# Patient Record
Sex: Female | Born: 1940 | Race: White | Hispanic: No | Marital: Married | Smoking: Never smoker
Health system: Southern US, Community
[De-identification: ages and names within clinical notes are randomized; demographics above are authoritative.]

## PROBLEM LIST (undated history)

## (undated) DIAGNOSIS — I10 Essential (primary) hypertension: Secondary | ICD-10-CM

## (undated) DIAGNOSIS — I350 Nonrheumatic aortic (valve) stenosis: Secondary | ICD-10-CM

## (undated) DIAGNOSIS — I82409 Acute embolism and thrombosis of unspecified deep veins of unspecified lower extremity: Secondary | ICD-10-CM

## (undated) DIAGNOSIS — Q248 Other specified congenital malformations of heart: Secondary | ICD-10-CM

## (undated) DIAGNOSIS — I2699 Other pulmonary embolism without acute cor pulmonale: Secondary | ICD-10-CM

## (undated) DIAGNOSIS — I2510 Atherosclerotic heart disease of native coronary artery without angina pectoris: Secondary | ICD-10-CM

## (undated) DIAGNOSIS — E785 Hyperlipidemia, unspecified: Secondary | ICD-10-CM

## (undated) HISTORY — DX: Nonrheumatic aortic (valve) stenosis: I35.0

## (undated) HISTORY — DX: Acute embolism and thrombosis of unspecified deep veins of unspecified lower extremity: I82.409

## (undated) HISTORY — DX: Other pulmonary embolism without acute cor pulmonale: I26.99

## (undated) HISTORY — DX: Hyperlipidemia, unspecified: E78.5

## (undated) HISTORY — DX: Other specified congenital malformations of heart: Q24.8

## (undated) HISTORY — DX: Atherosclerotic heart disease of native coronary artery without angina pectoris: I25.10

## (undated) HISTORY — DX: Essential (primary) hypertension: I10

---

## 2000-01-26 HISTORY — PX: OTHER SURGICAL HISTORY: SHX169

## 2000-04-14 ENCOUNTER — Encounter: Payer: Self-pay | Admitting: Critical Care Medicine

## 2000-04-14 ENCOUNTER — Inpatient Hospital Stay (HOSPITAL_COMMUNITY): Admission: AD | Admit: 2000-04-14 | Discharge: 2000-04-20 | Payer: Self-pay | Admitting: Critical Care Medicine

## 2000-05-20 ENCOUNTER — Ambulatory Visit (HOSPITAL_COMMUNITY): Admission: RE | Admit: 2000-05-20 | Discharge: 2000-05-20 | Payer: Self-pay | Admitting: Optometry

## 2000-06-07 ENCOUNTER — Encounter: Payer: Self-pay | Admitting: Thoracic Surgery (Cardiothoracic Vascular Surgery)

## 2000-06-10 ENCOUNTER — Encounter (INDEPENDENT_AMBULATORY_CARE_PROVIDER_SITE_OTHER): Payer: Self-pay | Admitting: Specialist

## 2000-06-10 ENCOUNTER — Encounter: Payer: Self-pay | Admitting: Thoracic Surgery (Cardiothoracic Vascular Surgery)

## 2000-06-10 ENCOUNTER — Inpatient Hospital Stay (HOSPITAL_COMMUNITY)
Admission: RE | Admit: 2000-06-10 | Discharge: 2000-06-15 | Payer: Self-pay | Admitting: Thoracic Surgery (Cardiothoracic Vascular Surgery)

## 2000-06-11 ENCOUNTER — Encounter: Payer: Self-pay | Admitting: Thoracic Surgery (Cardiothoracic Vascular Surgery)

## 2000-06-12 ENCOUNTER — Encounter: Payer: Self-pay | Admitting: Thoracic Surgery (Cardiothoracic Vascular Surgery)

## 2000-06-13 ENCOUNTER — Encounter: Payer: Self-pay | Admitting: Thoracic Surgery (Cardiothoracic Vascular Surgery)

## 2000-06-14 ENCOUNTER — Encounter: Payer: Self-pay | Admitting: Thoracic Surgery (Cardiothoracic Vascular Surgery)

## 2000-07-06 ENCOUNTER — Encounter: Payer: Self-pay | Admitting: Thoracic Surgery (Cardiothoracic Vascular Surgery)

## 2000-07-06 ENCOUNTER — Encounter
Admission: RE | Admit: 2000-07-06 | Discharge: 2000-07-06 | Payer: Self-pay | Admitting: Thoracic Surgery (Cardiothoracic Vascular Surgery)

## 2001-12-12 ENCOUNTER — Ambulatory Visit (HOSPITAL_COMMUNITY): Admission: RE | Admit: 2001-12-12 | Discharge: 2001-12-12 | Payer: Self-pay | Admitting: Family Medicine

## 2001-12-12 ENCOUNTER — Encounter: Payer: Self-pay | Admitting: Family Medicine

## 2002-04-30 ENCOUNTER — Encounter (HOSPITAL_COMMUNITY): Admission: RE | Admit: 2002-04-30 | Discharge: 2002-05-30 | Payer: Self-pay | Admitting: *Deleted

## 2002-04-30 ENCOUNTER — Encounter: Payer: Self-pay | Admitting: *Deleted

## 2002-12-18 ENCOUNTER — Ambulatory Visit (HOSPITAL_COMMUNITY): Admission: RE | Admit: 2002-12-18 | Discharge: 2002-12-18 | Payer: Self-pay | Admitting: Family Medicine

## 2003-01-31 ENCOUNTER — Ambulatory Visit (HOSPITAL_COMMUNITY): Admission: RE | Admit: 2003-01-31 | Discharge: 2003-01-31 | Payer: Self-pay | Admitting: Family Medicine

## 2003-12-06 ENCOUNTER — Ambulatory Visit: Payer: Self-pay | Admitting: *Deleted

## 2004-01-06 ENCOUNTER — Ambulatory Visit: Payer: Self-pay | Admitting: Cardiology

## 2004-01-17 ENCOUNTER — Ambulatory Visit: Payer: Self-pay | Admitting: Cardiology

## 2004-02-06 ENCOUNTER — Ambulatory Visit: Payer: Self-pay | Admitting: *Deleted

## 2004-02-25 ENCOUNTER — Ambulatory Visit (HOSPITAL_COMMUNITY): Admission: RE | Admit: 2004-02-25 | Discharge: 2004-02-25 | Payer: Self-pay | Admitting: Family Medicine

## 2004-02-26 ENCOUNTER — Ambulatory Visit (HOSPITAL_COMMUNITY): Admission: RE | Admit: 2004-02-26 | Discharge: 2004-02-26 | Payer: Self-pay | Admitting: Family Medicine

## 2004-03-05 ENCOUNTER — Ambulatory Visit: Payer: Self-pay | Admitting: *Deleted

## 2004-04-02 ENCOUNTER — Ambulatory Visit: Payer: Self-pay | Admitting: *Deleted

## 2004-05-12 ENCOUNTER — Ambulatory Visit: Payer: Self-pay | Admitting: Cardiology

## 2004-06-10 ENCOUNTER — Ambulatory Visit: Payer: Self-pay | Admitting: *Deleted

## 2004-07-08 ENCOUNTER — Ambulatory Visit: Payer: Self-pay | Admitting: *Deleted

## 2004-08-19 ENCOUNTER — Ambulatory Visit: Payer: Self-pay | Admitting: *Deleted

## 2004-09-23 ENCOUNTER — Ambulatory Visit: Payer: Self-pay | Admitting: Cardiology

## 2004-10-21 ENCOUNTER — Ambulatory Visit: Payer: Self-pay | Admitting: *Deleted

## 2004-11-18 ENCOUNTER — Ambulatory Visit: Payer: Self-pay | Admitting: *Deleted

## 2004-12-22 ENCOUNTER — Ambulatory Visit: Payer: Self-pay | Admitting: Cardiology

## 2005-01-12 ENCOUNTER — Ambulatory Visit: Payer: Self-pay | Admitting: Cardiology

## 2005-01-27 ENCOUNTER — Ambulatory Visit: Payer: Self-pay | Admitting: *Deleted

## 2005-03-03 ENCOUNTER — Ambulatory Visit: Payer: Self-pay | Admitting: *Deleted

## 2005-04-02 ENCOUNTER — Ambulatory Visit: Payer: Self-pay | Admitting: *Deleted

## 2005-04-22 ENCOUNTER — Ambulatory Visit: Payer: Self-pay | Admitting: Cardiovascular Disease

## 2005-05-06 ENCOUNTER — Ambulatory Visit: Payer: Self-pay | Admitting: *Deleted

## 2005-06-02 ENCOUNTER — Ambulatory Visit (HOSPITAL_COMMUNITY): Admission: RE | Admit: 2005-06-02 | Discharge: 2005-06-02 | Payer: Self-pay | Admitting: Family Medicine

## 2005-06-03 ENCOUNTER — Ambulatory Visit: Payer: Self-pay | Admitting: *Deleted

## 2005-07-08 ENCOUNTER — Ambulatory Visit: Payer: Self-pay | Admitting: *Deleted

## 2005-08-11 ENCOUNTER — Ambulatory Visit: Payer: Self-pay | Admitting: *Deleted

## 2005-09-21 ENCOUNTER — Ambulatory Visit: Payer: Self-pay | Admitting: *Deleted

## 2005-10-20 ENCOUNTER — Ambulatory Visit: Payer: Self-pay | Admitting: Cardiology

## 2005-11-30 ENCOUNTER — Ambulatory Visit: Payer: Self-pay | Admitting: Cardiology

## 2005-12-28 ENCOUNTER — Ambulatory Visit: Payer: Self-pay | Admitting: Cardiology

## 2006-01-27 ENCOUNTER — Ambulatory Visit: Payer: Self-pay | Admitting: Cardiology

## 2006-03-01 ENCOUNTER — Ambulatory Visit: Payer: Self-pay | Admitting: Cardiology

## 2006-03-29 ENCOUNTER — Ambulatory Visit: Payer: Self-pay | Admitting: Cardiovascular Disease

## 2006-04-26 ENCOUNTER — Encounter (HOSPITAL_COMMUNITY): Admission: RE | Admit: 2006-04-26 | Discharge: 2006-05-26 | Payer: Self-pay | Admitting: Cardiovascular Disease

## 2006-04-26 ENCOUNTER — Ambulatory Visit: Payer: Self-pay | Admitting: Cardiovascular Disease

## 2006-05-30 ENCOUNTER — Ambulatory Visit: Payer: Self-pay | Admitting: Cardiology

## 2006-06-30 ENCOUNTER — Ambulatory Visit: Payer: Self-pay | Admitting: Cardiovascular Disease

## 2006-07-14 ENCOUNTER — Ambulatory Visit: Payer: Self-pay | Admitting: Cardiovascular Disease

## 2006-08-02 ENCOUNTER — Ambulatory Visit (HOSPITAL_COMMUNITY): Admission: RE | Admit: 2006-08-02 | Discharge: 2006-08-02 | Payer: Self-pay | Admitting: Family Medicine

## 2006-08-16 ENCOUNTER — Ambulatory Visit: Payer: Self-pay | Admitting: Cardiology

## 2006-09-13 ENCOUNTER — Ambulatory Visit: Payer: Self-pay | Admitting: Cardiovascular Disease

## 2006-10-19 ENCOUNTER — Ambulatory Visit: Payer: Self-pay | Admitting: Cardiology

## 2006-11-16 ENCOUNTER — Ambulatory Visit: Payer: Self-pay | Admitting: Cardiology

## 2006-12-14 ENCOUNTER — Ambulatory Visit: Payer: Self-pay | Admitting: Cardiology

## 2007-01-12 ENCOUNTER — Ambulatory Visit: Payer: Self-pay | Admitting: Cardiovascular Disease

## 2007-02-10 ENCOUNTER — Ambulatory Visit: Payer: Self-pay | Admitting: Internal Medicine

## 2007-03-07 ENCOUNTER — Observation Stay (HOSPITAL_COMMUNITY): Admission: EM | Admit: 2007-03-07 | Discharge: 2007-03-08 | Payer: Self-pay | Admitting: Emergency Medicine

## 2007-03-07 ENCOUNTER — Ambulatory Visit: Payer: Self-pay | Admitting: Cardiology

## 2007-03-09 ENCOUNTER — Ambulatory Visit: Payer: Self-pay | Admitting: Cardiology

## 2007-04-06 ENCOUNTER — Ambulatory Visit: Payer: Self-pay | Admitting: Cardiology

## 2007-04-20 ENCOUNTER — Ambulatory Visit: Payer: Self-pay | Admitting: Cardiology

## 2007-05-17 ENCOUNTER — Ambulatory Visit: Payer: Self-pay | Admitting: Cardiovascular Disease

## 2007-06-02 ENCOUNTER — Ambulatory Visit: Payer: Self-pay | Admitting: Internal Medicine

## 2007-06-30 ENCOUNTER — Ambulatory Visit: Payer: Self-pay | Admitting: Cardiology

## 2007-08-01 ENCOUNTER — Ambulatory Visit: Payer: Self-pay | Admitting: Cardiology

## 2007-09-06 ENCOUNTER — Ambulatory Visit: Payer: Self-pay | Admitting: Cardiology

## 2007-10-04 ENCOUNTER — Ambulatory Visit: Payer: Self-pay | Admitting: Cardiology

## 2007-11-02 ENCOUNTER — Ambulatory Visit: Payer: Self-pay | Admitting: Cardiology

## 2007-11-17 ENCOUNTER — Ambulatory Visit (HOSPITAL_COMMUNITY): Admission: RE | Admit: 2007-11-17 | Discharge: 2007-11-17 | Payer: Self-pay | Admitting: Family Medicine

## 2007-11-30 ENCOUNTER — Ambulatory Visit: Payer: Self-pay | Admitting: Cardiology

## 2007-12-28 ENCOUNTER — Ambulatory Visit: Payer: Self-pay | Admitting: Cardiology

## 2008-01-29 ENCOUNTER — Ambulatory Visit: Payer: Self-pay | Admitting: Cardiology

## 2008-02-05 ENCOUNTER — Ambulatory Visit: Payer: Self-pay | Admitting: Cardiology

## 2008-02-10 ENCOUNTER — Emergency Department (HOSPITAL_COMMUNITY): Admission: EM | Admit: 2008-02-10 | Discharge: 2008-02-10 | Payer: Self-pay | Admitting: Emergency Medicine

## 2008-02-13 ENCOUNTER — Ambulatory Visit (HOSPITAL_COMMUNITY): Admission: RE | Admit: 2008-02-13 | Discharge: 2008-02-13 | Payer: Self-pay | Admitting: Family Medicine

## 2008-02-29 ENCOUNTER — Ambulatory Visit: Payer: Self-pay | Admitting: Cardiology

## 2008-03-28 ENCOUNTER — Ambulatory Visit: Payer: Self-pay | Admitting: Cardiology

## 2008-04-25 ENCOUNTER — Ambulatory Visit: Payer: Self-pay | Admitting: Cardiology

## 2008-05-20 ENCOUNTER — Ambulatory Visit: Payer: Self-pay | Admitting: Cardiology

## 2008-06-21 ENCOUNTER — Ambulatory Visit: Payer: Self-pay | Admitting: Cardiovascular Disease

## 2008-07-22 DIAGNOSIS — I82409 Acute embolism and thrombosis of unspecified deep veins of unspecified lower extremity: Secondary | ICD-10-CM | POA: Insufficient documentation

## 2008-07-22 DIAGNOSIS — I10 Essential (primary) hypertension: Secondary | ICD-10-CM | POA: Insufficient documentation

## 2008-07-22 DIAGNOSIS — E785 Hyperlipidemia, unspecified: Secondary | ICD-10-CM | POA: Insufficient documentation

## 2008-07-22 DIAGNOSIS — D3A090 Benign carcinoid tumor of the bronchus and lung: Secondary | ICD-10-CM

## 2008-07-24 ENCOUNTER — Ambulatory Visit: Payer: Self-pay | Admitting: Cardiology

## 2008-07-24 ENCOUNTER — Encounter: Payer: Self-pay | Admitting: Cardiology

## 2008-07-24 DIAGNOSIS — I359 Nonrheumatic aortic valve disorder, unspecified: Secondary | ICD-10-CM | POA: Insufficient documentation

## 2008-07-30 ENCOUNTER — Encounter: Payer: Self-pay | Admitting: Cardiology

## 2008-07-30 ENCOUNTER — Ambulatory Visit (HOSPITAL_COMMUNITY): Admission: RE | Admit: 2008-07-30 | Discharge: 2008-07-30 | Payer: Self-pay | Admitting: Cardiology

## 2008-07-30 ENCOUNTER — Ambulatory Visit: Payer: Self-pay | Admitting: Cardiovascular Disease

## 2008-08-05 ENCOUNTER — Ambulatory Visit: Payer: Self-pay | Admitting: Cardiology

## 2008-09-09 ENCOUNTER — Ambulatory Visit: Payer: Self-pay | Admitting: Cardiology

## 2008-09-09 ENCOUNTER — Encounter: Payer: Self-pay | Admitting: *Deleted

## 2008-09-23 ENCOUNTER — Ambulatory Visit: Payer: Self-pay

## 2008-10-21 ENCOUNTER — Encounter: Payer: Self-pay | Admitting: Cardiology

## 2008-10-24 ENCOUNTER — Ambulatory Visit: Payer: Self-pay | Admitting: Cardiology

## 2008-11-21 ENCOUNTER — Ambulatory Visit: Payer: Self-pay | Admitting: Cardiology

## 2008-12-17 ENCOUNTER — Ambulatory Visit (HOSPITAL_COMMUNITY): Admission: RE | Admit: 2008-12-17 | Discharge: 2008-12-17 | Payer: Self-pay | Admitting: Family Medicine

## 2008-12-23 ENCOUNTER — Ambulatory Visit: Payer: Self-pay | Admitting: Cardiology

## 2009-01-27 ENCOUNTER — Ambulatory Visit: Payer: Self-pay | Admitting: Cardiology

## 2009-02-24 ENCOUNTER — Ambulatory Visit: Payer: Self-pay | Admitting: Cardiology

## 2009-02-24 LAB — CONVERTED CEMR LAB: POC INR: 2.6

## 2009-03-24 ENCOUNTER — Ambulatory Visit: Payer: Self-pay | Admitting: Cardiology

## 2009-03-24 LAB — CONVERTED CEMR LAB: POC INR: 2.1

## 2009-04-21 ENCOUNTER — Ambulatory Visit: Payer: Self-pay | Admitting: Cardiology

## 2009-05-19 ENCOUNTER — Ambulatory Visit: Payer: Self-pay | Admitting: Cardiology

## 2009-05-19 LAB — CONVERTED CEMR LAB: POC INR: 2.2

## 2009-06-16 ENCOUNTER — Ambulatory Visit: Payer: Self-pay | Admitting: Cardiology

## 2009-06-16 LAB — CONVERTED CEMR LAB: POC INR: 2

## 2009-07-14 ENCOUNTER — Ambulatory Visit: Payer: Self-pay | Admitting: Cardiovascular Disease

## 2009-08-13 ENCOUNTER — Ambulatory Visit: Payer: Self-pay | Admitting: Cardiology

## 2009-08-13 LAB — CONVERTED CEMR LAB: POC INR: 1.7

## 2009-09-03 ENCOUNTER — Ambulatory Visit: Payer: Self-pay | Admitting: Cardiology

## 2009-09-03 DIAGNOSIS — I2510 Atherosclerotic heart disease of native coronary artery without angina pectoris: Secondary | ICD-10-CM

## 2009-09-03 LAB — CONVERTED CEMR LAB: POC INR: 2.1

## 2009-09-04 ENCOUNTER — Encounter: Payer: Self-pay | Admitting: Cardiology

## 2009-10-01 ENCOUNTER — Ambulatory Visit: Payer: Self-pay | Admitting: Cardiology

## 2009-10-01 LAB — CONVERTED CEMR LAB: POC INR: 2.3

## 2009-10-06 ENCOUNTER — Encounter: Payer: Self-pay | Admitting: Cardiology

## 2009-10-29 ENCOUNTER — Ambulatory Visit: Payer: Self-pay | Admitting: Cardiology

## 2009-11-05 ENCOUNTER — Encounter (INDEPENDENT_AMBULATORY_CARE_PROVIDER_SITE_OTHER): Payer: Self-pay | Admitting: *Deleted

## 2009-11-26 ENCOUNTER — Ambulatory Visit: Payer: Self-pay | Admitting: Cardiology

## 2009-12-24 ENCOUNTER — Ambulatory Visit: Payer: Self-pay | Admitting: Cardiology

## 2009-12-24 LAB — CONVERTED CEMR LAB: POC INR: 2.6

## 2010-01-13 ENCOUNTER — Ambulatory Visit (HOSPITAL_COMMUNITY)
Admission: RE | Admit: 2010-01-13 | Discharge: 2010-01-13 | Payer: Self-pay | Source: Home / Self Care | Attending: Family Medicine | Admitting: Family Medicine

## 2010-01-28 ENCOUNTER — Ambulatory Visit: Admission: RE | Admit: 2010-01-28 | Discharge: 2010-01-28 | Payer: Self-pay | Source: Home / Self Care

## 2010-02-14 ENCOUNTER — Encounter: Payer: Self-pay | Admitting: Family Medicine

## 2010-02-24 NOTE — Medication Information (Signed)
Summary: ccr-lr  Anticoagulant Therapy  Managed by: Vashti Hey, RN Referring MD: Charlton Haws PCP: Dr. 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 no    Recent/future dental: no  Any missed doses?: no       Is patient compliant with meds? yes       Allergies: 1)  ! Zocor 2)  ! Asa 3)  ! * Niaspan 4)  ! * Altace  Anticoagulation Management History:      The patient is taking warfarin and comes in today for a routine follow up visit.  Positive risk factors for bleeding include an age of 70 years or older.  The bleeding index is 'intermediate risk'.  Positive CHADS2 values include History of HTN.  Negative CHADS2 values include Age > 38 years old.  The start date was 06/17/2000.  Anticoagulation responsible provider: Dietrich Pates MD, Molly Maduro.  INR POC: 2.3.  Cuvette Lot#: [id number].  Exp: 08/2010.    Anticoagulation Management Assessment/Plan:      The patient's current anticoagulation dose is Jantoven 4 mg tabs: as directed by Coumadin clinic.  The target INR is 2 - 3.  The next INR is due 10/29/2009.  Anticoagulation instructions were given to patient.  Results were reviewed/authorized by Vashti Hey, RN.  She was notified by Vashti Hey RN.         Prior Anticoagulation Instructions: INR 2.1 Continue coumadin 2mg  once daily except 4mg  on Mondays and Thursdays  Current Anticoagulation Instructions: INR 2.3 Continue coumadin 2mg  once daily except 4mg  on Mondays and Thursdays

## 2010-02-24 NOTE — Medication Information (Signed)
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 no    Recent/future dental: no  Any missed doses?: no       Is patient compliant with meds? yes       Allergies: 1)  ! Zocor 2)  ! Asa 3)  ! * Niaspan 4)  ! * Altace  Anticoagulation Management History:      The patient is taking warfarin and comes in today for a routine follow up visit.  Positive risk factors for bleeding include an age of 70 years or older.  The bleeding index is 'intermediate risk'.  Positive CHADS2 values include History of HTN.  Negative CHADS2 values include Age > 70 years old.  The start date was 06/17/2000.  Anticoagulation responsible provider: Dietrich Pates MD, Molly Maduro.  INR POC: 2.6.  Cuvette Lot#: [id number].  Exp: 10/11.    Anticoagulation Management Assessment/Plan:      The patient's current anticoagulation dose is Jantoven 4 mg tabs: as directed by Coumadin clinic.  The target INR is 2 - 3.  The next INR is due 03/24/2009.  Anticoagulation instructions were given to patient.  Results were reviewed/authorized by Vashti Hey, RN.  She was notified by Vashti Hey RN.         Prior Anticoagulation Instructions: INR 1.9 Take coumadin 1 1/2 tablet today then resume 1/2 once daily except 1 tablet on Mondays and Thursdays  Current Anticoagulation Instructions: INR 2.6 Continue coumadin 2mg  once daily except 4mg  on Mondays and Thursdays

## 2010-02-24 NOTE — Medication Information (Signed)
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 no    Recent/future dental: no  Any missed doses?: no       Is patient compliant with meds? yes       Allergies: 1)  ! Zocor 2)  ! Asa 3)  ! * Niaspan 4)  ! * Altace  Anticoagulation Management History:      The patient is taking warfarin and comes in today for a routine follow up visit.  Positive risk factors for bleeding include an age of 35 years or older.  The bleeding index is 'intermediate risk'.  Positive CHADS2 values include History of HTN.  Negative CHADS2 values include Age > 107 years old.  The start date was 06/17/2000.  Anticoagulation responsible provider: Diona Browner MD, Remi Deter.  INR POC: 2.1.  Cuvette Lot#: [id number].  Exp: 08/2010.    Anticoagulation Management Assessment/Plan:      The patient's current anticoagulation dose is Jantoven 4 mg tabs: as directed by Coumadin clinic.  The target INR is 2 - 3.  The next INR is due 10/01/2009.  Anticoagulation instructions were given to patient.  Results were reviewed/authorized by Vashti Hey, RN.  She was notified by Vashti Hey RN.         Prior Anticoagulation Instructions: INR 1.7 Take coumadin 6mg  tonight then resume 2mg  once daily except 4mg  on Mondays and Thursdays  Current Anticoagulation Instructions: INR 2.1 Continue coumadin 2mg  once daily except 4mg  on Mondays and Thursdays

## 2010-02-24 NOTE — Medication Information (Signed)
Summary: ccr-lr  Anticoagulant Therapy  Managed by: Vashti Hey, RN Referring MD: Charlton Haws PCP: Dr. 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 no    Recent/future dental: no  Any missed doses?: no       Is patient compliant with meds? yes       Allergies: 1)  ! Zocor 2)  ! Asa 3)  ! * Niaspan 4)  ! * Altace  Anticoagulation Management History:      The patient is taking warfarin and comes in today for a routine follow up visit.  Positive risk factors for bleeding include an age of 94 years or older.  The bleeding index is 'intermediate risk'.  Positive CHADS2 values include History of HTN.  Negative CHADS2 values include Age > 97 years old.  The start date was 06/17/2000.  Anticoagulation responsible provider: Dietrich Pates MD, Molly Maduro.  INR POC: 2.3.  Cuvette Lot#: [id number].  Exp: 08/2010.    Anticoagulation Management Assessment/Plan:      The patient's current anticoagulation dose is Jantoven 4 mg tabs: as directed by Coumadin clinic.  The target INR is 2 - 3.  The next INR is due 12/24/2009.  Anticoagulation instructions were given to patient.  Results were reviewed/authorized by Vashti Hey, RN.  She was notified by Vashti Hey RN.         Prior Anticoagulation Instructions: INR 2.1 Continue coumadin 2mg  once daily except 4mg  on Mondays and Thursdays  Current Anticoagulation Instructions: INR 2.3 Continue coumadin 2mg  once daily except 4mg  on Mondays and Thursdays

## 2010-02-24 NOTE — Medication Information (Signed)
Summary: ccr-lr  Anticoagulant Therapy  Managed by: Vashti Hey, RN Referring MD: Charlton Haws PCP: Dr. 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 no    Recent/future dental: no  Any missed doses?: no       Is patient compliant with meds? yes       Allergies: 1)  ! Zocor 2)  ! Asa 3)  ! * Niaspan 4)  ! * Altace  Anticoagulation Management History:      The patient is taking warfarin and comes in today for a routine follow up visit.  Positive risk factors for bleeding include an age of 62 years or older.  The bleeding index is 'intermediate risk'.  Positive CHADS2 values include History of HTN.  Negative CHADS2 values include Age > 13 years old.  The start date was 06/17/2000.  Anticoagulation responsible : Diona Browner MD, Remi Deter.  INR POC: 2.1.  Cuvette Lot#: [id number].  Exp: 08/2010.    Anticoagulation Management Assessment/Plan:      The patient's current anticoagulation dose is Jantoven 4 mg tabs: as directed by Coumadin clinic.  The target INR is 2 - 3.  The next INR is due 11/26/2009.  Anticoagulation instructions were given to patient.  Results were reviewed/authorized by Vashti Hey, RN.  She was notified by Vashti Hey RN.         Prior Anticoagulation Instructions: INR 2.3 Continue coumadin 2mg  once daily except 4mg  on Mondays and Thursdays  Current Anticoagulation Instructions: INR 2.1 Continue coumadin 2mg  once daily except 4mg  on Mondays and Thursdays Prescriptions: ZETIA 10 MG TABS (EZETIMIBE) 1 TAB at bedtime  #90 x 1   Entered by:   Vashti Hey RN   Authorized by:   Loreli Slot, MD, [facility]   Signed by:   Vashti Hey RN on  10/29/2009   Method used:   Electronically to        [facility]* (retail)       [address] [place], [place]  [address]       Ph: [phone]       Fax: [phone]   RxID:   [id number] METOPROLOL SUCCINATE 50 MG XR24H-TAB (METOPROLOL SUCCINATE) 1and 1/2 tabs once daily  #135 x 1   Entered by:   Vashti Hey RN   Authorized by:   Loreli Slot, MD, [facility]   Signed by:   Vashti Hey RN on 10/29/2009   Method used:   Electronically to        [facility] (retail)       [address] [place], [place]  [address]       Ph: [phone]       Fax: [phone]   RxID:   [id number]

## 2010-02-24 NOTE — Medication Information (Signed)
Summary: ccr-lr  Anticoagulant Therapy  Managed by: Danielle Hey, RN Referring MD: Charlton Haws PCP: Dr. 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 no    Recent/future dental: no  Any missed doses?: no       Is patient compliant with meds? yes       Allergies: 1)  ! Zocor 2)  ! Asa 3)  ! * Niaspan 4)  ! * Altace  Anticoagulation Management History:      The patient is taking warfarin and comes in today for a routine follow up visit.  Positive risk factors for bleeding include an age of 70 years or older.  The bleeding index is 'intermediate risk'.  Positive CHADS2 values include History of HTN.  Negative CHADS2 values include Age > 12 years old.  The start date was 06/17/2000.  Anticoagulation responsible provider: Diona Browner MD, Remi Deter.  INR POC: 2.6.  Cuvette Lot#: [id number].  Exp: 08/2010.    Anticoagulation Management Assessment/Plan:      The patient's current anticoagulation dose is Jantoven 4 mg tabs: as directed by Coumadin clinic.  The target INR is 2 - 3.  The next INR is due 01/28/2010.  Anticoagulation instructions were given to patient.  Results were reviewed/authorized by Danielle Hey, RN.  She was notified by Danielle Hey RN.         Prior Anticoagulation Instructions: INR 2.3 Continue coumadin 2mg  once daily except 4mg  on Mondays and Thursdays  Current Anticoagulation Instructions: INR 2.6 Continue coumadin 2mg  once daily except 4mg  on Mondays and Thursdays

## 2010-02-24 NOTE — Medication Information (Signed)
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 no    Recent/future dental: no  Any missed doses?: no       Is patient compliant with meds? yes       Allergies: 1)  ! Zocor 2)  ! Asa 3)  ! * Niaspan 4)  ! * Altace  Anticoagulation Management History:      The patient is taking warfarin and comes in today for a routine follow up visit.  Positive risk factors for bleeding include an age of 70 years or older.  The bleeding index is 'intermediate risk'.  Positive CHADS2 values include History of HTN.  Negative CHADS2 values include Age > 70 years old.  The start date was 06/17/2000.  Anticoagulation responsible provider: Daleen Squibb MD, Maisie Fus.  INR POC: 1.7.  Cuvette Lot#: [id number].  Exp: 08/2010.    Anticoagulation Management Assessment/Plan:      The patient's current anticoagulation dose is Jantoven 4 mg tabs: as directed by Coumadin clinic.  The target INR is 2 - 3.  The next INR is due 09/03/2009.  Anticoagulation instructions were given to patient.  Results were reviewed/authorized by Vashti Hey, RN.  She was notified by Vashti Hey RN.         Prior Anticoagulation Instructions: INR 2.7  Continue same dose of 2 mg daily except 4mg  on Monday and Thursday.   Current Anticoagulation Instructions: INR 1.7 Take coumadin 6mg  tonight then resume 2mg  once daily except 4mg  on Mondays and Thursdays

## 2010-02-24 NOTE — Medication Information (Signed)
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 no    Recent/future dental: no  Any missed doses?: no       Is patient compliant with meds? yes       Allergies: 1)  ! Zocor 2)  ! Asa 3)  ! * Niaspan 4)  ! * Altace  Anticoagulation Management History:      The patient is taking warfarin and comes in today for a routine follow up visit.  Positive risk factors for bleeding include an age of 70 years or older.  The bleeding index is 'intermediate risk'.  Positive CHADS2 values include History of HTN.  Negative CHADS2 values include Age > 50 years old.  The start date was 06/17/2000.  Anticoagulation responsible provider: Dietrich Pates MD, Molly Maduro.  INR POC: 2.3.  Cuvette Lot#: [id number].  Exp: 10/11.    Anticoagulation Management Assessment/Plan:      The patient's current anticoagulation dose is Jantoven 4 mg tabs: as directed by Coumadin clinic.  The target INR is 2 - 3.  The next INR is due 05/19/2009.  Anticoagulation instructions were given to patient.  Results were reviewed/authorized by Vashti Hey, RN.  She was notified by Vashti Hey RN.         Prior Anticoagulation Instructions: INR 2.1 Continue coumadin 2mg  once daily except 4mg  on Mondays and Thursdays  Current Anticoagulation Instructions: INR 2.3 Continue coumadin 2mg  once daily except 4mg  on Mondays and Thursdays

## 2010-02-24 NOTE — Assessment & Plan Note (Signed)
Summary: ROV PASS DUE FOR F/U  Medications Added PRAVASTATIN SODIUM 20 MG TABS (PRAVASTATIN SODIUM) take 1/2 tab daily      Allergies Added:   Visit Type:  Follow-up Primary Provider:  Dr. Butch Penny   History of Present Illness: 70 year old woman presents for followup. She was last seen back in November 2009. She reports no problems with angina or unusual breathlessness. She has been able to tolerate Zetia and low-dose pravastatin and will be due for followup labs with Dr. Renard Matter in November.  I reviewed her electrocardiogram and last ischemic evaluation. Echocardiography from last summer showed only mild aortic stenosis which we are following him clinically at this point.  No bleeding problems on Coumadin.  Current Medications (verified): 1)  Zetia 10 Mg Tabs (Ezetimibe) .Marland Kitchen.. 1 Tab At Bedtime 2)  Cozaar 50 Mg Tabs (Losartan Potassium) .Marland Kitchen.. 1 Tab Once Daily 3)  Jantoven 4 Mg Tabs (Warfarin Sodium) .... As Directed By Coumadin Clinic 4)  Pravastatin Sodium 20 Mg Tabs (Pravastatin Sodium) .... Take 1/2 Tab Daily 5)  Aspir-Low 81 Mg Tbec (Aspirin) .Marland Kitchen.. 1 Tab Once Daily 6)  Metoprolol Succinate 50 Mg Xr24h-Tab (Metoprolol Succinate) .Marland Kitchen.. 1and 1/2 Tabs Once Daily 7)  Nitroglycerin 0.4 Mg Subl (Nitroglycerin) .... As Needed  Allergies (verified): 1)  ! Zocor 2)  ! Asa 3)  ! * Niaspan 4)  ! * Altace  Past History:  Social History: Last updated: 07/22/2008 Retired  Married  Tobacco Use - No.  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         Otherwise reviewed and negative.  Vital Signs:  Patient profile:   70 year old female Weight:      143 pounds BMI:     28.03 Pulse rate:   60 / minute BP sitting:   149 / 67  (right arm)  Vitals Entered By: Dreama Saa, CNA (September 03, 2009 1:25 PM)  Physical Exam  Additional Exam:  Patient is comfortable and in no acute distress. HEENT: Conjunctiva and lids are normal, oropharynx clear. Neck: Supple no elevated jugular venous pressure or carotid bruits. Lungs: Clear without labored breathing at rest. Cardiac: Regular rate and rhythm with 2/6 systolic murmur at the base, preserved second heart sound. No S3 gallop or pericardial rub. Abdomen: Soft and nontender. No hepatomegaly or bruits. Extremities: No pitting edema. Distal pulses are 2+. Mild venous stasis. Musculoskeletal: No kyphosis noted. Neuropsychiatric: Alert and oriented x3. Affect is normal.   Nuclear Study  Procedure date:  03/08/2007  Findings:      SCINTIGRAPHIC DATA:  Acquisition notable for mild to moderate breast   attenuation.  Left ventricular size was normal.  On tomographic   images reconstructed in standard planes, there was a minimal defect   at the apex of borderline numeric significance.  The resting images   were unchanged.  The gated reconstruction demonstrated normal to   hyperdynamic regional and global LV systolic function as well as   normal systolic accentuation of activity throughout.  Estimated   ejection fraction exceeded .65.   IMPRESSION:   Negative stress Myoview study revealing adequate exercise tolerance,   a negative stress EKG, normal left ventricular size, normal left   ventricular systolic function, and normal myocardial perfusion except   for prominent physiologic apical thinning.  Other findings as noted.   Echocardiogram  Procedure date:   07/30/2008  Findings:       Study Conclusions    1. Left ventricle: The cavity size was normal. Wall thickness was      increased in a pattern of mild LVH. The estimated ejection      fraction was 55%.   2. Aortic valve: There was mild stenosis. Valve area: 2cm^2(VTI).      Valve area: 1.9cm^2 (Vmax).   3. Mitral valve: Mild regurgitation.   4. Left atrium: The appendage was mildly to moderately dilated.   5. Atrial septum: No defect or patent foramen ovale was identified.   6. Pulmonary arteries: PA peak pressure: 40mm Hg (S).  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 Myoview from February 2009 was reviewed. Continue medical therapy with clinical visit in 6 months.  Her updated medication list for this problem includes:    Jantoven 4 Mg Tabs (Warfarin sodium) .Marland Kitchen... As directed by coumadin clinic    Aspir-low 81 Mg Tbec (Aspirin) .Marland Kitchen... 1 tab once daily    Metoprolol Succinate 50 Mg Xr24h-tab (Metoprolol succinate) .Marland Kitchen... 1and 1/2 tabs once daily    Nitroglycerin 0.4 Mg Subl (Nitroglycerin) .Marland Kitchen... As needed  Problem # 2:  AORTIC VALVE DISORDERS (ICD-424.1)  Mild aortic valve stenosis, asymptomatic. Will follow clinically at this point.  Her updated medication list for this problem includes:    Cozaar 50 Mg Tabs (Losartan potassium) .Marland Kitchen... 1 tab once daily    Metoprolol Succinate 50 Mg Xr24h-tab (Metoprolol succinate) .Marland Kitchen... 1and 1/2 tabs once daily    Nitroglycerin 0.4 Mg Subl (Nitroglycerin) .Marland Kitchen... As needed  Problem # 3:  DYSLIPIDEMIA (ICD-272.4)  Patient due for a followup lipid profile and liver function tests, to be obtained during her next physical with Dr. Renard Matter. I would suggest that if her LDL is not optimally controlled, at least under 100, ideally closer to 70, it may be worthwhile taking her off Zetia and pravastatin, and  initiating a trial of low-dose Crestor.  Her updated  medication list for this problem includes:    Zetia 10 Mg Tabs (Ezetimibe) .Marland Kitchen... 1 tab at bedtime    Pravastatin Sodium 20 Mg Tabs (Pravastatin sodium) .Marland Kitchen... Take 1/2 tab daily  Patient Instructions: 1)  Your physician recommends that you schedule a follow-up appointment in: 6 months 2)  Your physician recommends that you continue on your current medications as directed. Please refer to the Current Medication list given to you today. Prescriptions: METOPROLOL SUCCINATE 50 MG XR24H-TAB (METOPROLOL SUCCINATE) 1and 1/2 tabs once daily  #135 x 1   Entered by:   Larita Fife Via LPN   Authorized by:   Loreli Slot, MD, [facility]   Signed by:   Larita Fife Via LPN on 07/62/2633   Method used:   Electronically to        [facility] (retail)       [address] [place], [place]  [address]       Ph: [phone]       Fax: [phone]   RxID:   [id number] PRAVASTATIN SODIUM 20 MG  TABS (PRAVASTATIN SODIUM) take 1/2 tab daily  #45 x 1   Entered by:   Larita Fife Via LPN   Authorized by:   Loreli Slot, MD, [facility]   Signed by:   Larita Fife Via LPN on 16/10/9602   Method used:   Electronically to        [facility] (retail)       [address] [place], [place]  [address]       Ph: [phone]       Fax: [phone]   RxID:   [id number] JANTOVEN 4 MG TABS (WARFARIN SODIUM) as directed by Coumadin clinic  #135 x 1   Entered by:   Larita Fife Via LPN   Authorized by:   Loreli Slot, MD, [facility]   Signed by:   Larita Fife Via LPN on 11/21/2534   Method used:   Electronically to        [facility] (retail)       [address] [place], [place]  [address]       Ph: [phone]       Fax: [phone]   RxID:   [id number] COZAAR 50 MG TABS (LOSARTAN POTASSIUM) 1 tab once daily  #90 x 1   Entered by:   Larita Fife Via LPN   Authorized by:   Loreli Slot, MD, [facility]   Signed by:   Larita Fife Via LPN on 32/35/5732   Method used:   Electronically to        [facility] (retail)        [address] [place], [place]  [address]       Ph: [phone]       Fax: [phone]   RxID:   [id number] ZETIA 10 MG TABS (EZETIMIBE) 1 TAB at bedtime  #90 x 1   Entered by:   Larita Fife Via LPN   Authorized by:   Loreli Slot, MD, [facility]   Signed by:   Larita Fife Via LPN on 35/00/9381   Method used:   Electronically to        [facility] (retail)       [address] [place], [place]  [address]       Ph: [phone]       Fax: [phone]   RxID:   [id number]

## 2010-02-24 NOTE — Letter (Signed)
Summary: LABS 12/03/08  LABS 12/03/08   Imported By: Faythe Ghee 10/06/2009 09:38:43  _____________________________________________________________________  External Attachment:    Type:   Image     Comment:   External Document  Appended Document: LABS 12/03/08 Reviewed.  LDL 107.  If she would like, could consider switching from Zetia and Pravachol to Crestor starting at 5 mg daily, and then advance as tolerated to get better LDL control. If switch is made would check FLP and LFT in 12 weeks.  Appended Document: LABS 12/03/08  PT CALLED BACK SHE IS AT WORK PLEASE CALL HER AT [phone].  Phone Note Outgoing Call   Call placed by: Larita Fife Via LPN,  October 06, 2009 10:52 AM Summary of Call: [facility].  Follow-up for Phone Call        Pt. states she would like to stay on current meds until she has f/u with Dr. Renard Matter in November when he checks her lipids again. Pt. states she has had problems in the past with statins and since she can tolerate what she is on now she will continue same for now.

## 2010-02-24 NOTE — Medication Information (Signed)
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 no    Recent/future dental: no  Any missed doses?: no       Is patient compliant with meds? yes       Allergies: 1)  ! Zocor 2)  ! Asa 3)  ! * Niaspan 4)  ! * Altace  Anticoagulation Management History:      The patient is taking warfarin and comes in today for a routine follow up visit.  Positive risk factors for bleeding include an age of 70 years or older.  The bleeding index is 'intermediate risk'.  Positive CHADS2 values include History of HTN.  Negative CHADS2 values include Age > 70 years old.  The start date was 06/17/2000.  Anticoagulation responsible provider: Dietrich Pates MD, Molly Maduro.  INR POC: 2.1.  Cuvette Lot#: [id number].  Exp: 10/11.    Anticoagulation Management Assessment/Plan:      The patient's current anticoagulation dose is Jantoven 4 mg tabs: as directed by Coumadin clinic.  The target INR is 2 - 3.  The next INR is due 04/21/2009.  Anticoagulation instructions were given to patient.  Results were reviewed/authorized by Vashti Hey, RN.  She was notified by Vashti Hey RN.         Prior Anticoagulation Instructions: INR 2.6 Continue coumadin 2mg  once daily except 4mg  on Mondays and Thursdays  Current Anticoagulation Instructions: INR 2.1 Continue coumadin 2mg  once daily except 4mg  on Mondays and Thursdays

## 2010-02-24 NOTE — Medication Information (Signed)
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 no    Recent/future dental: no  Any missed doses?: no       Is patient compliant with meds? yes       Allergies: 1)  ! Zocor 2)  ! Asa 3)  ! * Niaspan 4)  ! * Altace  Anticoagulation Management History:      The patient is taking warfarin and comes in today for a routine follow up visit.  Positive risk factors for bleeding include an age of 70 years or older.  The bleeding index is 'intermediate risk'.  Positive CHADS2 values include History of HTN.  Negative CHADS2 values include Age > 70 years old.  The start date was 06/17/2000.  Anticoagulation responsible provider: Diona Browner MD, Remi Deter.  INR POC: 2.2.  Cuvette Lot#: [id number].  Exp: 10/11.    Anticoagulation Management Assessment/Plan:      The patient's current anticoagulation dose is Jantoven 4 mg tabs: as directed by Coumadin clinic.  The target INR is 2 - 3.  The next INR is due 06/16/2009.  Anticoagulation instructions were given to patient.  Results were reviewed/authorized by Vashti Hey, RN.  She was notified by Vashti Hey RN.         Prior Anticoagulation Instructions: INR 2.3 Continue coumadin 2mg  once daily except 4mg  on Mondays and Thursdays  Current Anticoagulation Instructions: INR 2.2 Continue coumadin 2mg  once daily except 4mg  on Mondays and Thursdays

## 2010-02-24 NOTE — Medication Information (Signed)
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 no    Recent/future dental: no  Any missed doses?: no       Is patient compliant with meds? yes       Allergies: 1)  ! Zocor 2)  ! Asa 3)  ! * Niaspan 4)  ! * Altace  Anticoagulation Management History:      The patient is taking warfarin and comes in today for a routine follow up visit.  Positive risk factors for bleeding include an age of 30 years or older.  The bleeding index is 'intermediate risk'.  Positive CHADS2 values include History of HTN.  Negative CHADS2 values include Age > 29 years old.  The start date was 06/17/2000.  Anticoagulation responsible provider: Eden Emms MD, Theron Arista.  INR POC: 2.7.  Cuvette Lot#: [id number].  Exp: 08/2010.    Anticoagulation Management Assessment/Plan:      The patient's current anticoagulation dose is Jantoven 4 mg tabs: as directed by Coumadin clinic.  The target INR is 2 - 3.  The next INR is due 08/11/2009.  Anticoagulation instructions were given to patient.  Results were reviewed/authorized by Weston Brass, PharmD.  She was notified by Weston Brass PharmD.         Prior Anticoagulation Instructions: INR 2.0 Continue couamdin 2mg  once daily except 4mg  on Mondays and Thursdays  Current Anticoagulation Instructions: INR 2.7  Continue same dose of 2 mg daily except 4mg  on Monday and Thursday.

## 2010-02-24 NOTE — Miscellaneous (Signed)
Summary: ntg refill  Clinical Lists Changes  Medications: Rx of NITROGLYCERIN 0.4 MG SUBL (NITROGLYCERIN) as needed;  #25 x 3;  Signed;  Entered by: Teressa Lower RN;  Authorized by: Loreli Slot, MD, [facility];  Method used: Electronically to [facility] [address] [place], [place], [place]  [address], Ph: [phone], Fax: [phone]    Prescriptions: NITROGLYCERIN 0.4 MG SUBL (NITROGLYCERIN) as needed  #25 x 3   Entered by:   Teressa Lower RN   Authorized by:   Loreli Slot, MD, [facility]   Signed by:   Teressa Lower RN on 11/05/2009   Method used:   Electronically to        [facility] (retail)       [address] [place], [place]  [address]       Ph: [phone]       Fax: [phone]   RxID:   [id number]

## 2010-02-24 NOTE — Medication Information (Signed)
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 no    Recent/future dental: no  Any missed doses?: no       Is patient compliant with meds? yes       Allergies: 1)  ! Zocor 2)  ! Asa 3)  ! * Niaspan 4)  ! * Altace  Anticoagulation Management History:      The patient is taking warfarin and comes in today for a routine follow up visit.  Positive risk factors for bleeding include an age of 52 years or older.  The bleeding index is 'intermediate risk'.  Positive CHADS2 values include History of HTN.  Negative CHADS2 values include Age > 15 years old.  The start date was 06/17/2000.  Anticoagulation responsible provider: Diona Browner MD, Remi Deter.  INR POC: 1.9.  Cuvette Lot#: [id number].  Exp: 10/11.    Anticoagulation Management Assessment/Plan:      The patient's current anticoagulation dose is Jantoven 4 mg tabs: as directed by Coumadin clinic.  The target INR is 2 - 3.  The next INR is due 02/24/2009.  Anticoagulation instructions were given to patient.  Results were reviewed/authorized by Vashti Hey, RN.  She was notified by Vashti Hey RN.         Prior Anticoagulation Instructions: INR 2.2 Continue coumadin 2mg  once daily except 4mg  on Mondays and Thursdays  Current Anticoagulation Instructions: INR 1.9 Take coumadin 1 1/2 tablet today then resume 1/2 once daily except 1 tablet on Mondays and Thursdays

## 2010-02-24 NOTE — Medication Information (Signed)
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 yes       Details: Took Abx for resp infection   Finished 1 1/2 wks ago  Recent/future dental: no  Any missed doses?: no       Is patient compliant with meds? yes       Allergies: 1)  ! Zocor 2)  ! Asa 3)  ! * Niaspan 4)  ! * Altace  Anticoagulation Management History:      The patient is taking warfarin and comes in today for a routine follow up visit.  Positive risk factors for bleeding include an age of 16 years or older.  The bleeding index is 'intermediate risk'.  Positive CHADS2 values include History of HTN.  Negative CHADS2 values include Age > 22 years old.  The start date was 06/17/2000.  Anticoagulation responsible provider: Dietrich Pates MD, Molly Maduro.  INR POC: 2.0.  Cuvette Lot#: [id number].  Exp: 10/11.    Anticoagulation Management Assessment/Plan:      The patient's current anticoagulation dose is Jantoven 4 mg tabs: as directed by Coumadin clinic.  The target INR is 2 - 3.  The next INR is due 07/14/2009.  Anticoagulation instructions were given to patient.  Results were reviewed/authorized by Vashti Hey, RN.  She was notified by Vashti Hey RN.         Prior Anticoagulation Instructions: INR 2.2 Continue coumadin 2mg  once daily except 4mg  on Mondays and Thursdays  Current Anticoagulation Instructions: INR 2.0 Continue couamdin 2mg  once daily except 4mg  on Mondays and Thursdays

## 2010-02-25 ENCOUNTER — Encounter: Payer: Self-pay | Admitting: Cardiology

## 2010-02-25 ENCOUNTER — Ambulatory Visit: Admit: 2010-02-25 | Payer: Self-pay

## 2010-02-25 ENCOUNTER — Encounter (INDEPENDENT_AMBULATORY_CARE_PROVIDER_SITE_OTHER): Payer: 59

## 2010-02-25 DIAGNOSIS — I2699 Other pulmonary embolism without acute cor pulmonale: Secondary | ICD-10-CM

## 2010-02-26 NOTE — Medication Information (Signed)
Summary: ccr-lr  Anticoagulant Therapy  Managed by: Vashti Hey, RN Referring MD: Charlton Haws PCP: Dr. 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 no    Recent/future dental: no  Any missed doses?: no       Is patient compliant with meds? yes       Allergies: 1)  ! Zocor 2)  ! Asa 3)  ! * Niaspan 4)  ! * Altace  Anticoagulation Management History:      The patient is taking warfarin and comes in today for a routine follow up visit.  Positive risk factors for bleeding include an age of 70 years or older.  The bleeding index is 'intermediate risk'.  Positive CHADS2 values include History of HTN.  Negative CHADS2 values include Age > 70 years old.  The start date was 06/17/2000.  Anticoagulation responsible provider: Dietrich Pates MD, Molly Maduro.  INR POC: 2.2.  Cuvette Lot#: [id number].  Exp: 08/2010.    Anticoagulation Management Assessment/Plan:      The patient's current anticoagulation dose is Jantoven 4 mg tabs: as directed by Coumadin clinic.  The target INR is 2 - 3.  The next INR is due 02/25/2010.  Anticoagulation instructions were given to patient.  Results were reviewed/authorized by Vashti Hey, RN.  She was notified by Vashti Hey RN.         Prior Anticoagulation Instructions: INR 2.6 Continue coumadin 2mg  once daily except 4mg  on Mondays and Thursdays  Current Anticoagulation Instructions: INR 2.2 Continue coumadin 2mg  once daily except 4mg  on Mondays and Thursdays

## 2010-03-04 NOTE — Medication Information (Signed)
Summary: Coumadin Clinic  Anticoagulant Therapy  Managed by: Vashti Hey, RN Referring MD: Charlton Haws PCP: Dr. 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 no    Recent/future dental: no  Any missed doses?: no       Is patient compliant with meds? yes       Allergies: 1)  ! Zocor 2)  ! Asa 3)  ! * Niaspan 4)  ! * Altace  Anticoagulation Management History:      The patient is taking warfarin and comes in today for a routine follow up visit.  Positive risk factors for bleeding include an age of 45 years or older.  The bleeding index is 'intermediate risk'.  Positive CHADS2 values include History of HTN.  Negative CHADS2 values include Age > 70 years old.  The start date was 06/17/2000.  Anticoagulation responsible provider: Diona Browner MD, Remi Deter.  INR POC: 2.4.  Cuvette Lot#: [id number].  Exp: 08/2010.    Anticoagulation Management Assessment/Plan:      The patient's current anticoagulation dose is Jantoven 4 mg tabs: as directed by Coumadin clinic.  The target INR is 2 - 3.  The next INR is due 03/25/2010.  Anticoagulation instructions were given to patient.  Results were reviewed/authorized by Vashti Hey, RN.  She was notified by Vashti Hey RN.         Prior Anticoagulation Instructions: INR 2.2 Continue coumadin 2mg  once daily except 4mg  on Mondays and Thursdays  Current Anticoagulation Instructions: INR 2.4 Continue coumadin 2mg  once daily except 4mg  on Mondays and Thursdays

## 2010-03-25 ENCOUNTER — Encounter: Payer: Self-pay | Admitting: Cardiology

## 2010-03-25 ENCOUNTER — Encounter (INDEPENDENT_AMBULATORY_CARE_PROVIDER_SITE_OTHER): Payer: 59

## 2010-03-25 DIAGNOSIS — I2699 Other pulmonary embolism without acute cor pulmonale: Secondary | ICD-10-CM

## 2010-03-25 DIAGNOSIS — Z7901 Long term (current) use of anticoagulants: Secondary | ICD-10-CM

## 2010-03-31 ENCOUNTER — Ambulatory Visit (INDEPENDENT_AMBULATORY_CARE_PROVIDER_SITE_OTHER): Payer: 59 | Admitting: Cardiology

## 2010-03-31 ENCOUNTER — Encounter: Payer: Self-pay | Admitting: Cardiology

## 2010-03-31 DIAGNOSIS — E782 Mixed hyperlipidemia: Secondary | ICD-10-CM

## 2010-03-31 DIAGNOSIS — I359 Nonrheumatic aortic valve disorder, unspecified: Secondary | ICD-10-CM

## 2010-03-31 DIAGNOSIS — I2510 Atherosclerotic heart disease of native coronary artery without angina pectoris: Secondary | ICD-10-CM

## 2010-03-31 DIAGNOSIS — I2699 Other pulmonary embolism without acute cor pulmonale: Secondary | ICD-10-CM

## 2010-04-02 NOTE — Medication Information (Signed)
Summary: ccr-lr  Anticoagulant Therapy  Managed by: Vashti Hey, RN Referring MD: Charlton Haws PCP: Dr. 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 no    Recent/future dental: no  Any missed doses?: no       Is patient compliant with meds? yes       Allergies: 1)  ! Zocor 2)  ! Asa 3)  ! * Niaspan 4)  ! * Altace  Anticoagulation Management History:      The patient is taking warfarin and comes in today for a routine follow up visit.  Positive risk factors for bleeding include an age of 70 years or older.  The bleeding index is 'intermediate risk'.  Positive CHADS2 values include History of HTN.  Negative CHADS2 values include Age > 70 years old.  The start date was 06/17/2000.  Anticoagulation responsible provider: Dietrich Pates MD, Molly Maduro.  INR POC: 1.8.  Cuvette Lot#: [id number].  Exp: 08/2010.    Anticoagulation Management Assessment/Plan:      The patient's current anticoagulation dose is Jantoven 4 mg tabs: as directed by Coumadin clinic.  The target INR is 2 - 3.  The next INR is due 04/23/2010.  Anticoagulation instructions were given to patient.  Results were reviewed/authorized by Vashti Hey, RN.  She was notified by Vashti Hey RN.         Prior Anticoagulation Instructions: INR 2.4 Continue coumadin 2mg  once daily except 4mg  on Mondays and Thursdays  Current Anticoagulation Instructions: INR 1.8 Take coumadin 1 tablet tonight then resume 1/2 tablet once daily except 1 tablet on Mondays and Thursdays

## 2010-04-03 ENCOUNTER — Encounter (INDEPENDENT_AMBULATORY_CARE_PROVIDER_SITE_OTHER): Payer: Self-pay | Admitting: *Deleted

## 2010-04-07 NOTE — Miscellaneous (Signed)
Summary: pravastatin clarificatin order  Clinical Lists Changes  Medications: Changed medication from PRAVASTATIN SODIUM 20 MG TABS (PRAVASTATIN SODIUM) take 1/2 tab daily 1 whole tab every other day to PRAVASTATIN SODIUM 20 MG TABS (PRAVASTATIN SODIUM) take 1 tablet every other day alternating with 1/2 tablet every other day

## 2010-04-07 NOTE — Assessment & Plan Note (Signed)
Summary: FOLLOW UP - 6 MONTHS  Medications Added ZETIA 10 MG TABS (EZETIMIBE) 1 TAB at bedtime COZAAR 50 MG TABS (LOSARTAN POTASSIUM) 1 tab once daily JANTOVEN 4 MG TABS (WARFARIN SODIUM) as directed by Coumadin clinic PRAVASTATIN SODIUM 20 MG TABS (PRAVASTATIN SODIUM) take 1/2 tab daily 1 whole tab every other day METOPROLOL SUCCINATE 50 MG XR24H-TAB (METOPROLOL SUCCINATE) 1and 1/2 tabs once daily CALCIUM 500 MG TABS (CALCIUM) take 1 tab daily      Allergies Added:   Visit Type:  Follow-up Primary Provider:  Dr. Butch Penny   History of Present Illness: 70 year old woman presents for followup. She was seen in August 2011.  She reports no angina or unusual shortness of breath, no use of nitroglycerin. She continues to work 3 days a week at [employer]. Walks for exercise.  No cough or hemoptysis. Reports compliance with Coumadin, no bleeding problems.  Last stress test was in 2009, reviewed below.  Current Medications (verified): 1)  Zetia 10 Mg Tabs (Ezetimibe) .Marland Kitchen.. 1 Tab At Bedtime 2)  Cozaar 50 Mg Tabs (Losartan Potassium) .Marland Kitchen.. 1 Tab Once Daily 3)  Jantoven 4 Mg Tabs (Warfarin Sodium) .... As Directed By Coumadin Clinic 4)  Pravastatin Sodium 20 Mg Tabs (Pravastatin Sodium) .... Take 1/2 Tab Daily 1 Whole Tab Every Other Day 5)  Aspir-Low 81 Mg Tbec (Aspirin) .Marland Kitchen.. 1 Tab Once Daily 6)  Metoprolol Succinate 50 Mg Xr24h-Tab (Metoprolol Succinate) .Marland Kitchen.. 1and 1/2 Tabs Once Daily 7)  Nitroglycerin 0.4 Mg Subl (Nitroglycerin) .... As Needed 8)  Calcium 500 Mg Tabs (Calcium) .... Take 1 Tab Daily  Allergies (verified): 1)  ! Zocor 2)  ! Asa 3)  ! * Niaspan 4)  ! * Altace  Comments:  Nurse/Medical Assistant: patient and i reviewed meds from previous ov no meds no list [facility]  Past History:  Past Medical History: Last updated: 09/03/2009 CAD - BMS LAD 2002 Hyperlipidemia Hypertension Aortic Stenosis Pericardial cyst status post excision 2002 Pulmonary  Embolism and DVT - Coumadin  Social History: Last updated: 07/22/2008 Retired  Married  Tobacco Use - No.  Alcohol Use - no Regular Exercise - no Drug Use - no  Review of Systems  The patient denies anorexia, fever, weight gain, chest pain, syncope, dyspnea on exertion, peripheral edema, melena, and hematochezia.         Otherwise reviewed and negative except as outlined.  Vital Signs:  Patient profile:   70 year old female Weight:      143 pounds BMI:     28.03 Pulse rate:   58 / minute BP sitting:   148 / 76  (left arm)  Vitals Entered By: Dreama Saa, CNA (March 31, 2010 1:46 PM)  Physical Exam  Additional Exam:  Patient is comfortable and in no acute distress. HEENT: Conjunctiva and lids are normal, oropharynx clear. Neck: Supple no elevated jugular venous pressure or carotid bruits. Lungs: Clear without labored breathing at rest. Cardiac: Regular rate and rhythm with 2/6 systolic murmur at the base, preserved second heart sound. No S3 gallop or pericardial rub. Abdomen: Soft and nontender. No hepatomegaly or bruits. Extremities: No pitting edema. Distal pulses are 2+. Mild venous stasis. Musculoskeletal: No kyphosis noted. Neuropsychiatric: Alert and oriented x3. Affect is normal.   Prior Report Reviewed for Echocardiogram:  Findings: 07/30/2008  Study Conclusions    1. Left ventricle: The cavity size was normal. Wall thickness was      increased in a pattern of mild LVH.  The estimated ejection      fraction was 55%.   2. Aortic valve: There was mild stenosis. Valve area: 2cm^2(VTI).      Valve area: 1.9cm^2 (Vmax).   3. Mitral valve: Mild regurgitation.   4. Left atrium: The appendage was mildly to moderately dilated.   5. Atrial septum: No defect or patent foramen ovale was identified.   6. Pulmonary arteries: PA peak pressure: 40mm Hg (S).  Comments:    Prior Report Reviewed for Nuclear Study:  Findings: 03/08/2007 SCINTIGRAPHIC DATA:   Acquisition notable for mild to moderate breast   attenuation.  Left ventricular size was normal.  On tomographic   images reconstructed in standard planes, there was a minimal defect   at the apex of borderline numeric significance.  The resting images   were unchanged.  The gated reconstruction demonstrated normal to   hyperdynamic regional and global LV systolic function as well as   normal systolic accentuation of activity throughout.  Estimated   ejection fraction exceeded .65.   IMPRESSION:   Negative stress Myoview study revealing adequate exercise tolerance,   a negative stress EKG, normal left ventricular size, normal left   ventricular systolic function, and normal myocardial perfusion except   for prominent physiologic apical thinning.  Other findings as noted.   Impression & Recommendations:  Problem # 1:  CORONARY ATHEROSCLEROSIS NATIVE CORONARY ARTERY (ICD-414.01)  Her updated medication list for this problem includes:    Jantoven 4 Mg Tabs (Warfarin sodium) .Marland Kitchen... As directed by coumadin clinic    Aspir-low 81 Mg Tbec (Aspirin) .Marland Kitchen... 1 tab once daily    Metoprolol Succinate 50 Mg Xr24h-tab (Metoprolol succinate) .Marland Kitchen... 1and 1/2 tabs once daily    Nitroglycerin 0.4 Mg Subl (Nitroglycerin) .Marland Kitchen... As needed  Her updated medication list for this problem includes:    Jantoven 4 Mg Tabs (Warfarin sodium) .Marland Kitchen... As directed by coumadin clinic    Aspir-low 81 Mg Tbec (Aspirin) .Marland Kitchen... 1 tab once daily    Metoprolol Succinate 50 Mg Xr24h-tab (Metoprolol succinate) .Marland Kitchen... 1and 1/2 tabs once daily    Nitroglycerin 0.4 Mg Subl (Nitroglycerin) .Marland Kitchen... As needed  Problem # 2:  AORTIC VALVE DISORDERS (ICD-424.1)  Mild aortic valve stenosis.  Her updated medication list for this problem includes:    Cozaar 50 Mg Tabs (Losartan potassium) .Marland Kitchen... 1 tab once daily    Metoprolol Succinate 50 Mg Xr24h-tab (Metoprolol succinate) .Marland Kitchen... 1and 1/2 tabs once daily    Nitroglycerin 0.4 Mg Subl  (Nitroglycerin) .Marland Kitchen... As needed  Her updated medication list for this problem includes:    Cozaar 50 Mg Tabs (Losartan potassium) .Marland Kitchen... 1 tab once daily    Metoprolol Succinate 50 Mg Xr24h-tab (Metoprolol succinate) .Marland Kitchen... 1and 1/2 tabs once daily    Nitroglycerin 0.4 Mg Subl (Nitroglycerin) .Marland Kitchen... As needed  Orders: Nuclear Stress Test (Nuc Stress Test)  Problem # 3:  DYSLIPIDEMIA (ICD-272.4)  Continues to follow with Dr. Renard Matter.  Her updated medication list for this problem includes:    Zetia 10 Mg Tabs (Ezetimibe) .Marland Kitchen... 1 tab at bedtime    Pravastatin Sodium 20 Mg Tabs (Pravastatin sodium) .Marland Kitchen... Take 1/2 tab daily 1 whole tab every other day  Her updated medication list for this problem includes:    Zetia 10 Mg Tabs (Ezetimibe) .Marland Kitchen... 1 tab at bedtime    Pravastatin Sodium 20 Mg Tabs (Pravastatin sodium) .Marland Kitchen... Take 1/2 tab daily 1 whole tab every other day  Problem # 4:  DVT (ICD-453.40)  Also history  of pulmonary embolus, continues on chronic Coumadin.  Patient Instructions: 1)  Your physician recommends that you schedule a follow-up appointment in: 6 months 2)  Your physician recommends that you continue on your current medications as directed. Please refer to the Current Medication list given to you today. 3)  Your physician has requested that you have an exercise stress myoview.  For further information please visit [email or web address]  Please follow instruction sheet, as given. Prescriptions: METOPROLOL SUCCINATE 50 MG XR24H-TAB (METOPROLOL SUCCINATE) 1and 1/2 tabs once daily  #135 x 1   Entered by:   Larita Fife Via LPN   Authorized by:   Loreli Slot, MD, [facility]   Signed by:   Larita Fife Via LPN on 11/21/2534   Method used:   Electronically to        [facility] (retail)       [address] [place], [place]  [address]       Ph: [phone]       Fax: [phone]   RxID:   [id number] PRAVASTATIN SODIUM 20 MG TABS (PRAVASTATIN SODIUM) take 1/2 tab daily 1 whole  tab every other day  #90 x 1   Entered by:   Larita Fife Via LPN   Authorized by:   Loreli Slot, MD, [facility]   Signed by:   Larita Fife Via LPN on 32/35/5732   Method used:   Electronically to        [facility] (retail)       [address] [place], [place]  [address]       Ph: [phone]       Fax: [phone]   RxID:   [id number] JANTOVEN 4 MG TABS (WARFARIN SODIUM) as directed by Coumadin clinic  #90 x 1   Entered by:   Larita Fife Via LPN   Authorized by:   Loreli Slot, MD, [facility]   Signed by:   Larita Fife Via LPN on 35/00/9381   Method used:   Electronically to        [facility] (retail)       [address] [place], [place]  [address]       Ph: [phone]       Fax: [phone]   RxID:   [id number] COZAAR 50 MG TABS (LOSARTAN POTASSIUM) 1 tab once daily  #90 x 1   Entered by:   Larita Fife Via LPN   Authorized by:   Loreli Slot, MD, [facility]   Signed by:   Larita Fife Via LPN on 15/40/0867   Method used:   Electronically to        [facility] (retail)       [address] [place], [place]  [address]       Ph: [phone]       Fax: [phone]   RxID:   [id number] ZETIA 10 MG TABS (EZETIMIBE) 1 TAB at bedtime  #90 x 1   Entered by:   Larita Fife Via LPN   Authorized by:   Loreli Slot, MD, [facility]   Signed by:   Larita Fife Via LPN on 09/73/5329   Method used:   Electronically to        [facility] (retail)       [address] [place], [place]  [address]       Ph: [phone]  Fax: [phone]   RxID:   [id number]

## 2010-04-07 NOTE — Letter (Signed)
Summary: [place] Treadmill (Nuc Med Stress)  [facility]  [address], [place] [address]   Phone: [phone]  Fax: [phone]    Nuclear Medicine 1-Day Stress Test Information Sheet  Re:     Danielle Murphy   DOB:     04-23-1940 MRN:     [id number] Weight:  Appointment Date: Register at: Appointment Time: Referring MD:  _X__Exercise Stress  __Adenosine   __Dobutamine  __Lexiscan  __Persantine   __Thallium  Urgency: ____1 (next day)   ____2 (one week)    ____3 (PRN)  Patient will receive Follow Up call with results: Patient needs follow-up appointment:  Instructions regarding medication:  How to prepare for your stress test: 1. DO NOT eat or drinK after midnight the night before test. This includes no caffeine (coffee, tea, sodas, chocolate) if you were instructed to take your medications, drink water with it. 2. DO NOT use any tobacco products for at leaset 8 hours prior to arrival. 3. DO NOT wear dresses or any clothing that may have metal clasps or buttons. 4. Wear short sleeve shirts, loose clothing, and comfortalbe walking shoes. 5. DO NOT use lotions, oils or powder on your chest before the test. 6. The test will take approximately 3-4 hours from the time you arrive until completion. 7. To register the day of the test, go to the Short Stay entrance at [facility]. 8. If you must cancel your test, call [phone] as soon as you are aware. 9. DO NOT TAKE METOPROLOL THE MORNING OF TEST After you arrive for test:   When you arrive at [facility], you will go to Short Stay to be registered. They will then send you to Radiology to check in. The Nuclear Medicine Tech will get you and start an IV in your arm or hand. A small amount of a radioactive tracer will then be injected into your IV. This tracer will then have to circulate for 30-45 minutes. During this time you will wait in the waiting room and you will be able to drink something without  caffeine. A series of pictures will be taken of your heart follwoing this waiting period. After the 1st set of pictures you will go to the stress lab to get ready for your stress test. During the stress test, another small amount of a radioactive tracer will be injected through your IV. When the stress test is complete, there is a short rest period while your heart rate and blood pressure will be monitored. When this monitoring period is complete you will have another set of pictrues taken. (The same as the 1st set of pictures). These pictures are taken between 15 minutes and 1 hour after the stress test. The time depends on the type of stress test you had. Your doctor will inform you of your test results within 7 days after test.    The possibilities of certain changes are possible during the test. They include abnormal blood pressure and disorders of the heart. Side effects of persantine or adenosine can include flushing, chest pain, shortness of breath, stomach tightness, headache and light-headedness. These side effects usually do not last long and are self-resolving. Every effort will be made to keep you comfortable and to minimize complications by obtaining a medical history and by close observation during the test. Emergency equipment, medications, and trained personnel are available to deal with any unusual situation which may arise.  Please notify office at least 48 hours in  advance if you are unable to keep this appt.

## 2010-04-21 ENCOUNTER — Encounter: Payer: Self-pay | Admitting: Cardiology

## 2010-04-21 DIAGNOSIS — I82409 Acute embolism and thrombosis of unspecified deep veins of unspecified lower extremity: Secondary | ICD-10-CM

## 2010-04-21 DIAGNOSIS — Z7901 Long term (current) use of anticoagulants: Secondary | ICD-10-CM | POA: Insufficient documentation

## 2010-04-23 ENCOUNTER — Ambulatory Visit (INDEPENDENT_AMBULATORY_CARE_PROVIDER_SITE_OTHER): Payer: 59 | Admitting: *Deleted

## 2010-04-23 DIAGNOSIS — I82409 Acute embolism and thrombosis of unspecified deep veins of unspecified lower extremity: Secondary | ICD-10-CM

## 2010-04-23 DIAGNOSIS — Z7901 Long term (current) use of anticoagulants: Secondary | ICD-10-CM

## 2010-05-20 ENCOUNTER — Ambulatory Visit (INDEPENDENT_AMBULATORY_CARE_PROVIDER_SITE_OTHER): Payer: 59 | Admitting: *Deleted

## 2010-05-20 DIAGNOSIS — Z7901 Long term (current) use of anticoagulants: Secondary | ICD-10-CM

## 2010-05-20 DIAGNOSIS — I82409 Acute embolism and thrombosis of unspecified deep veins of unspecified lower extremity: Secondary | ICD-10-CM

## 2010-06-09 NOTE — Assessment & Plan Note (Signed)
[facility]                       [facility] OFFICE NOTE   Danielle Murphy, Danielle Murphy                          MRN:          [id number]  DATE:05/17/2007                            DOB:          03/16/1940    Danielle Murphy returns today for follow-up.  Unfortunately, she was hospitalized  in the beginning of February.  I was not involved with her care.  She  was hospitalized with chest pain.  Consultation with done by Dr.  Dietrich Pates around February 10.  The patient has a previous history of a  bare metal stent to the LAD for an anterior wall MI in 2002.  She has  good LV function.  She has mild aortic stenosis, hypertension,  hyperlipidemia.   The patient's chart reveals that she had a normal stress Myoview with an  EF of 82%.   She exercised for 7 minutes in a standard Bruce protocol.   I do not have an echo report from the hospitalization.  The patient also  had a carotid duplex, which showed no critical disease.  I believe this  was done for a right carotid bruit.  The patient has done well since  hospital discharge.  She has a history of PEs and is on chronic  Coumadin.  She needs her pro time checked.   REVIEW OF SYSTEMS:  Otherwise negative.  It took me awhile to review her  hospital notes in regards to her admission, discharge and consultation  by Danielle Murphy and Danielle Murphy.   The patient's medications currently include:  1. Zetia 10 mg a day.  She gets myalgias with statins.  2. Cozaar 50 a day.  3. Coumadin as directed.  4. She is only able to tolerate low-dose Pravachol at 20 mg a day.  5. An aspirin a day.  6. Lopressor.  She is supposed to be taking 150 mg b.i.d. of      metoprolol.  She has been switched to the b.i.d. dose since [facility]      does not have the generic long-acting drug.   Her exam is remarkable for a healthy-appearing middle-aged white female  in no distress.  Affect appropriate.  Weight is 149, blood pressure is 130/80,  pulse 60 and regular, afebrile.  HEENT:  Unremarkable.  She has a right carotid bruit.  No lymphadenopathy, thyromegaly or JVP  elevation.  LUNGS:  Clear with diaphragmatic motion.  No wheezing.  S1 and S2 with a soft systolic murmur , S2 preserved.  PMI normal.  ABDOMEN:  Benign.  Bowel sounds positive.  No AAA, no tenderness, no  bruit, no hepatosplenomegaly or hepatojugular reflux.  Distal pulses are intact.  No edema.  NEUROLOGIC:  Nonfocal.  SKIN:  Warm and dry.  No muscular weakness.   IMPRESSION:  1. Coronary disease, previous anterior wall myocardial infarction.      Chest pain, ruled out with low-risk Myoview.  Continue aspirin and      beta-blocker.  2. History of pulmonary emboli.  Continue Coumadin.  PT to be checked      today.  3. Hypercholesterolemia.  Myalgias on Zocor.  Continue Pravachol 20 mg      a day.  Lipid and liver profile in 6 months.  4. Hypertension.  Currently well-controlled.  Continue Cozaar 50 mg a      day, particularly in light of her previous anterior myocardial      infarction in regards to favorable remodeling.  5. Right carotid bruit.  Follow-up carotid duplex in 2 years.  6. Continue aspirin therapy.   Overall, Danielle Murphy is doing well and I will see her back in 6 months.     Danielle Murphy. Danielle Emms, MD, [facility]  Electronically Signed    PCN/MedQ  DD: 05/17/2007  DT: 05/17/2007  Job #: [id number]

## 2010-06-09 NOTE — Assessment & Plan Note (Signed)
[facility]                       [place] [facility] OFFICE NOTE   Danielle Murphy                          MRN:          [id number]  DATE:06/30/2006                            DOB:          1940-10-10    Danielle Murphy returns today for followup.   I last saw her in March of 2008.   The patient has a history of LAD angioplasty by Dr. Chales Abrahams in 2005.  In  regards to her coronary disease, she has not had any significant chest  pain.  She had a followup stress Myoview study April 26, 2006, which was  nonischemic, with an EF of 82%.   In regards to her risk factor modification, her blood pressure has been  somewhat borderline controlled.  She has been taking Toprol and Cozaar  at night and found that her morning blood pressures were somewhat  elevated.  She has subsequently stopped her Pravachol because she  thought this was causing her problem.  Over the last few weeks her blood  pressure has been improved, she is taking 50 of Toprol at night and 50  of Cozaar in the morning now.   In regard to her cholesterol, she needs a followup lipid and liver  profile. We will wait to do this for another month or 2, since she has  been off her Pravachol for 3 weeks and she thought it was exacerbating  her hypertension.  She is also on Zetia 10 mg a day without any  significant GI problems.   The patient has had problems with previous statins; in particular, Zocor  which caused significant leg weakness.  When we started her back on  Pravachol this was not a problem.   She also needs to have her Coumadin level checked today.  She has a  history of PEs and is on chronic Coumadin therapy.   REVIEW OF SYSTEMS:  Is otherwise negative.  She has been active.  She  has been planting flowers and mowing the yard.   She also had a 2D echocardiogram done after I last saw her, this was  done on April 26, 2006.  She did have mild aortic stenosis with normal LV  function an EF  of 60%, this will need to be followed up in 2 years.   MEDICATIONS:  1. Zetia 10 mg a day.  2. Cozaar 50 mg a day.  3. Coumadin as directed.  4. Toprol 50 mg a day.  5. Pravachol 20 a day.   EXAM:  She is a healthy-appearing elderly white female in no distress.  Affect is appropriate, weight is 146, respiratory rate is 14, blood  pressure is 120/80, pulse is 68 and regular, she is afebrile.  HEENT:  Normal.  Carotids are normal without bruit.  There is no parvus  and no tardus.  There is no thyromegaly, no JVP elevation.  LUNGS:  Clear without wheezing with normal diaphragmatic motion.  PMI is  normal.  There is an S1, S2 with a soft systolic murmur.  Second heart  sound is well preserved, there is no aortic insufficiency.  PMI is  normal.  ABDOMEN:  Benign, bowel sounds are positive.  There is no tenderness, no  hepatosplenomegaly, no hepatojugular reflux, no AAA.  Pulses are +3 bilaterally at the femoral level with no bruits.  The PTs  are palpable bilaterally.  There is no lower extremity edema.  There is  no evidence of chronic venous insufficiency, no lymphadenopathy.  NEURO:  Nonfocal, there is no muscular weakness.   IMPRESSION:  1. History of coronary artery disease with distant angioplasty of the      left anterior descending, nonischemic Myoview and no chest pain,      cardiac disease stable.  2. Hypertension.  Currently stable on Toprol and Cozaar.  Continue      current medicines and low sodium diet.  3. Hypercholesterolemia.  Continue Pravachol and Zetia, check LFTs and      LDL cholesterol some time in the next 2 to 3 months.  Continue to      monitor for lower extremity leg pain since she has had a problem      with Zocor in the past.  4. History of chronic pulmonary embolism.  Patient to have her      prothrombin time checked today, continue Coumadin therapy.  5. Soft systolic murmur with mild aortic stenosis by echocardiogram.      Follow up in 2 years.   I  think the patient is currently doing fine.  I will see her back in 6  months.     [name]. Eden Emms, MD, [facility]  Electronically Signed    PCN/MedQ  DD: 06/30/2006  DT: 06/30/2006  Job #: [id number]

## 2010-06-09 NOTE — Assessment & Plan Note (Signed)
[facility]                       [place] CARDIOLOGY OFFICE NOTE   Danielle, Murphy                          MRN:          [id number]  DATE:11/30/2007                            DOB:          September 27, 1940    PRIMARY CARE PHYSICIAN:  Angus G. Renard Matter, MD   REASON FOR VISIT:  Routine followup.   HISTORY OF PRESENT ILLNESS:  This is my first meeting with Danielle Murphy.  She was seen most recently back in April by Dr. Eden Emms.  Her history  includes previous anterior wall myocardial infarction, status post bare-  metal stent placement to the left anterior descending in 2002; preserved  ventricular systolic function with mild aortic stenosis; recent  reassuring stress Myoview from February 2009; recurrent pulmonary  emboli, on chronic Coumadin; hypertension; hyperlipidemia with history  of statin intolerance; and previous benign lung tumor, status post  excision.  She reports doing well without any significant angina or  breathlessness.  She has some chronic left leg pain which limits her,  but she is able to walk up to a half mile at a time and still works 3  days a week in retail.  She states that she received notification from  her insurance  about the possibility of changing from Zetia and  Cozaar to some generics.  I asked her to let us know which alternatives  were suggested.  She has had a problem tolerating statins including now  Zocor, high-dose Pravachol, and even alternatively Niaspan.  Her present  regimen has done fairly well, although she states that her cholesterol  most recently was elevated and that she had to cut back her Pravachol to  10 mg a day due to some myalgias.  She tolerates Coumadin well and has  had no bleeding problems.  Her INR today was 2.5.   ALLERGIES:  HIGH-DOSE ASPIRIN, DARVON, intolerances to ZOCOR, NIASPAN,  and HIGH-DOSE PRAVACHOL.  There is mention of possible ALTACE allergy,  although I do not see this defined  anywhere with any clarity and she  does not recall a specific allergy to ALTACE, in fact, was on it for a  period of time.  I wonder if she had some trouble with cough.   REVIEW OF SYSTEMS:  As outlined above.  Negative.   PHYSICAL EXAMINATION:  VITAL SIGNS:  Blood pressure is 152/80; heart  rate is 66; and weight is 146 pounds, is down 3 pounds from last visit.  GENERAL:  The patient is comfortable in no acute distress.  HEENT:  Conjunctivae normal.  Pharynx is clear.  NECK:  Supple.  No elevated jugular vein pressure.  No loud bruits.  LUNGS:  Clear without labored breathing.  CARDIAC:  Regular rate and rhythm.  Soft systolic murmur at the base,  preserved second heart sound.  No S3 gallop.  ABDOMEN:  Soft and nontender.  EXTREMITIES:  No frank pitting edema.  Some venous stasis.   IMPRESSION AND RECOMMENDATIONS:  1. Cardiovascular disease, status post previous non-ST elevation      myocardial infarction with bare-metal stent placement to the left  anterior descending in 2002.  Subsequent left ventricular systolic      function is in the normal range and recent Myoview in February 2009      was reassuring.  She is symptomatically stable on medical therapy.      I will plan to see her back in 6 months.  2. Hyperlipidemia with history of statin intolerance and Niaspan      intolerance.  She is not having any trouble with Zetia and very low-      dose Pravachol.  3. History of recurrent pulmonary emboli, on chronic Coumadin,      followed through our Coumadin clinic, with therapeutic INR today.      She has not had any bleeding problems.     Jonelle Sidle, MD  Electronically Signed    SGM/MedQ  DD: 11/30/2007  DT: 12/01/2007  Job #: [id number]   cc:   Angus G. Renard Matter, MD
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
## 2010-06-09 NOTE — Assessment & Plan Note (Signed)
[facility]                       [place] CARDIOLOGY OFFICE NOTE   SIDNEY, SILBERMAN                          MRN:          [id number]  DATE:01/12/2007                            DOB:          04-22-1940    Sybil returns today for a followup, she has been followed for multiple  issues.  She has had a previous angioplasty of the LAD in 2002 by Dr.  Chales Abrahams with an EF of 45%, this has recovered, she has not had any  significant chest pain.  Interestingly, prior to this she had primarily  shortness of breath and not a chest pain syndrome, her EF was 80% by  recent Myoview which was nonischemic in April.  She has been doing well.  She also had a history of PE on chronic Coumadin therapy, she has not  had any bleeding diathesis.  Her INR today was 2.8.  She does not have a  chronic cough or fever, there is no sputum, no COPD and she has no  significant recurrent dyspnea.  She has been otherwise tolerant of her  medicines.  Risk factors are well-modified.  She is on Pravachol and  Zetia for coronary disease with an LDL under 100.   CURRENT MEDICATIONS:  1. Zetia 10 a day.  2. Cozaar 50 a day.  3. Coumadin as directed.  4. Toprol 75 a day.  5. Pravachol 20 a day.  6. Baby aspirin a day.   REVIEW OF SYSTEMS:  Otherwise negative.   EXAMINATION:  Remarkable for elderly white female in no distress, affect  is appropriate.  Weight is 152, blood pressure 130/70, pulse  68 and  regular, respiratory rate 14, afebrile.  HEENT:  Unremarkable.  Carotids normal without bruit; no  lymphadenopathy, thyromegaly or JVP elevation.  She is status post right  thoracotomy for mass that was removed from her lung that was benign.  There is no active wheezing, good diaphragmatic motion.  S1 and S2 with  normal heart sounds, PMI normal.  She does have a soft systolic murmur  which will have to be followed.  She has had an echo that showed mild MR  and a mild aortic stenosis  on April 26, 2006.  ABDOMEN:  Benign, bowel sounds positive, no AAA, no tenderness, no  hepatosplenomegaly, no hepatojugular reflux.  Distal pulses are intact with no edema.  NEURO:  Nonfocal.  SKIN:  Warm and dry.  No muscular weakness.   IMPRESSION:  1. Coronary disease, previous anterior wall myocardial infarction with      angioplasty of the left anterior descending, recovery of function.      Nonischemic Myoview in April, followup in April of 2009.  Continue      aspirin and beta blocker.  2. History of pulmonary embolism, chronic Coumadin therapy.  INR in      excellent range today, continue to follow in the Coumadin Clinic.  3. Hyperlipidemia in the setting of coronary disease, continue Zetia      and Pravachol.  Followup lipid and liver profile in 6 months.  4. Hypertension, currently well controlled.  Continue Cozaar 50 mg a      day, has had good recovery of left ventricular function after her      initial myocardial infarction.   Overall the patient is doing well and I will see her back in April when  she has her stress test.     Theron Arista C. Eden Emms, MD, [facility]  Electronically Signed    PCN/MedQ  DD: 01/12/2007  DT: 01/13/2007  Job #: [id number]
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
## 2010-06-12 NOTE — Discharge Summary (Signed)
[place]  Patient:    Danielle Murphy, Danielle Murphy                            MRN: [id number] Adm. Date:  [id number] Disc. Date: [id number] Attending:  Charlett Lango Dictator:   Tollie Pizza. Collins, P.A.-C. CC:         [name]. Eden Emms, M.D. [facility], [facility] in [place], [place] B. Sung Amabile, M.D. [facility]                           Discharge Summary  PRIMARY ADMITTING DIAGNOSIS:  Right lung mass.  ADDITIONAL DISCHARGE DIAGNOSES: 1. Benign pericardial cyst. 2. Hyperplastic reactive lymphadenopathy. 3. Coronary artery disease, status post myocardial infarction in March of    2002. 4. Hypertension. 5. Hypercholesterolemia. 6. History of deep venous thrombosis. 7. History of pulmonary emboli x 2.  PROCEDURES PERFORMED: 1. Bronchoscopy. 2. Right thoracotomy. 3. Excision of pericardial cyst.  HISTORY:  The patient is a 70 year old white female who was hospitalized in March of 2002 for what was initially thought to be pulmonary embolus.  She was ultimately ruled in for myocardial infarction and was found to have no PE at that time.  However, during the course of her work-up a CT scan was performed which showed a 2 cm right lower lobe nodule with right hilar lymphadenopathy. Following her complete recovery from her myocardial infarction, it was felt that she should undergo further evaluation for the lung nodule. She was seen by Oley Balm. Simonds, M.D., and it was felt that a bronchoscopic biopsy would be impossible because the lesion was in a retrocardiac location.  Therefore, she was referred to [facility] C. Dorris Fetch, M.D., for evaluation for surgical biopsy.  Initially, it was felt a VATS could be performed; however, again because of the location of the lesion, it was felt that most likely she would need a thoracotomy with a wedge resection.  HOSPITAL COURSE:  She was admitted on Jun 10, 2000, and taken to the operating room.  A bronchoscopy was  performed which was within normal limits.  A right thoracotomy was then performed.  There was no evidence of lung mass; however, there was large pericardial cysts which was adjacent to the right inferior pulmonary vein with enlarged hyperplastic lymph nodes.  This was resected and sent for frozen section and no evidence of tumor was found.  She tolerated the procedure well. She was able to be extubated in the operating room and was taken to the floor in stable condition.  Her chest tubes were placed to water-seal on postop day #2, and were able to be removed on postop day #3.  A follow-up chest x-ray and subsequent chest x-rays have shown no evidence of pneumothorax.  She was restarted on anticoagulation for her history of DVTs. At this time, her INR is 2.3 and her heparin has been discontinued.  She did have one brief episode of rapid atrial fibrillation for which she was started on digoxin. She did convert to normal sinus rhythm and at this time has maintained sinus rhythm with the rate in the 80s.  Otherwise she has remained afebrile and vital signs have been stable. She has been ambulating in the halls without difficulty.  She has been weaned off all supplemental oxygen and is maintaining O2 sats of around 97%.  Her  labs otherwise have been stable with hemoglobin at 10.4 and hematocrit of 29.8 today.  She is tolerating a regular diet and is having normal bowel and bladder function.  It is felt at this time she can be discharged home.  DISCHARGE MEDICATIONS: 1. Zocor 40 mg daily. 2. Toprol 75 mg daily. 3. Coumadin 4 mg daily. 4. Percocet one to two q.4h. p.r.n. for pain. 5. Altace 2.5 mg q.d. 6. Plavix 75 mg q.d. 7. Digoxin 0.125 mg q.d.  DISCHARGE INSTRUCTIONS:  She is to refrain from lifting anything heavier than 10 pounds or driving.  She may shower and clean her incisions daily with soap and water.  She will continue a low fat, low sodium diet.  She is asked to call our office  if she develops any redness, swelling or drainage around the incision sites or fever greater than 101.  DISCHARGE FOLLOW-UP:  She will have a PT and INR drawn on Friday at the [facility] in [place], [place], for Coumadin management.  An appointment will be made with Theron Arista C. Eden Emms, M.D., in the next one to two weeks for follow-up. She will see Dr. Dorris Fetch back in the office in three weeks and have a chest x-ray prior to that visit. She is asked to call our office if she experiences any changes or problems in the interim. Discharge instructions have been reviewed with the patient and a handwritten copy will be sent home with her.  DD:  06/15/00 TD:  06/15/00 Job: [id number]

## 2010-06-12 NOTE — Procedures (Signed)
Danielle Murphy, Danielle Murphy                   ACCOUNT NO.:  [id number]   MEDICAL RECORD NO.:  [id number]          PATIENT TYPE:  OUT   LOCATION:  RAD                           FACILITY:  [facility]   PHYSICIAN:  Peter C. Eden Emms, MD, FACCDATE OF BIRTH:  February 01, 1940   DATE OF PROCEDURE:  DATE OF DISCHARGE:                                ECHOCARDIOGRAM   2 D ECHOCARDIOGRAM.:   INDICATIONS:  Coronary disease.  Aortic stenosis.   Left ventricular cavity size was normal.  There was mild LVH, ejection  fraction was 60%.  There was mild left atrial enlargement.  Right-sided  cardiac chambers were normal.  There is no evidence of pulmonary  hypertension.   The aortic valve was trileaflet and sclerotic.  There was trivial aortic  stenosis.  The mean gradient was 10 mmHg with a peak gradient at 15  mmHg.  The peak aortic velocity was only 2 meters per second.   Mitral valve was mildly sclerotic with mild MR, subcostal imaging  revealed no atrial septal defect, no source of embolus, no effusion.   M-mode measurements included an aortic diameter 28 mm, left atrial  diameter of 36 mm, septal thickness 14 mm.  Peak aortic velocity 2  meters per second, peak aortic gradient 15 mm.  Mean gradient 10 mm, LV  diastolic dimension 40 mm, LV systolic dimension 33 mm.   FINAL IMPRESSION:  1. Mild LVH, normal LV function.  EF 60%.  2. Trivial to mild aortic stenosis.  3. Mild left atrial enlargement.  4. Mild MR.  5. Normal RA and RV  6. No pericardial effusion.   The patient should have a follow-up 2-D echocardiogram in 2 years.      [name]. Eden Emms, MD, [facility]  Electronically Signed     PCN/MEDQ  D:  04/26/2006  T:  04/26/2006  Job:  [id number]   cc:   Angus G. Renard Matter, MD  Fax: [phone]

## 2010-06-12 NOTE — Procedures (Signed)
   Danielle Murphy, Danielle Murphy NO.:  [id number]   MEDICAL RECORD NO.:  [id number]                  PATIENT TYPE:   LOCATION:                                       FACILITY:   PHYSICIAN:  Vida Roller, M.D.                DATE OF BIRTH:  August 03, 1940   DATE OF PROCEDURE:  DATE OF DISCHARGE:                                    STRESS TEST   PROCEDURE:  Adenosine Cardiolite Study.   INDICATIONS:  This patient is a 70 year old female with known coronary  artery disease status post non-Q wave MI in 2002 with subsequent PTCA of the  left anterior descending. Last Cardiolite in October 2002 showed no ischemia  and an ejection fraction of 72%.   BASELINE DATA:  EKG shows sinus rhythm at 59 beats/minute with nonspecific  ST abnormalities.  Her blood pressure is 142/70.   DESCRIPTION:  34 mg of adenosine was infused over a 4-minute protocol with  Cardiolite injected at 3 minutes. The patient reported a funny feeling  over her entire body with flushing.  These symptoms resolved in recovery.  Chest tightness and shortness of breath which resolved in recovery.   STRESS DATA:  1. Maximum heart rate is 91 beats/minute.  2. Maximum blood pressure is 142/72.  3. EKG showed no ischemic changes and no arrhythmias.  4. Final images and results are pending MD review.     Amy Mercy Riding, P.A. [facility]                     Vida Roller, M.D.    AB/MEDQ  D:  04/30/2002  T:  04/30/2002  Job:  [id number]
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
## 2010-06-12 NOTE — Discharge Summary (Signed)
[place]. [facility]  Patient:    Danielle Murphy, Danielle Murphy                            MRN: [id number] Adm. Date:  [id number] Disc. Date: [id number] Attending:  Merwyn Katos Dictator:   Rozell Searing, P.A. CC:         Butch Penny, M.D.             Oley Balm Sung Amabile, M.D. [facility]             Steven C. Dorris Fetch, M.D.                  Referring Physician Discharge [place]  PROCEDURES:  Coronary angiogram/stent, left anterior descending, on April 18, 2000.  REASON FOR ADMISSION:  Danielle Murphy is a 70 year old female, without prior history of heart disease, with past medical history notable for DVT/pulmonary embolus -- on chronic Coumadin, who initially presented to [facility] for evaluation of chest discomfort.  The episode was sudden and the chest pain was described as sharp and associated with nausea.  Initial workup consisted of negative cardiac enzymes and a negative chest x-ray; however, a CT scan of the chest questioned acute pulmonary embolus.  She was transferred for followup pulmonary angiography and consideration of possible Greenfield filter placement, given her history of pulmonary embolus and with a current therapeutic INR.  LABORATORY DATA:  Cardiac enzymes:  Peak CPK of 185 with peak MB 8.6.  Peak troponin I 1.08.  Lipid profile:  Cholesterol 240, triglycerides 67, HDL 31, LDL 196.  Normal CBC on admission.  INR 2.1 on admission.  Sodium 139, potassium 3.9, glucose 142, BUN 11, creatinine 0.8 on admission -- final metabolic profile revealed potassium 3.4, otherwise, normal.  HOSPITAL COURSE:  Following transfer from [facility], patient proceeded with a pulmonary angiogram which was negative for acute pulmonary embolism.  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  Elevated pulmonary artery pressures were also noted, particularly on the right.  It was also noted that the  initial chest CT (April 13, 2000) at [facility] noted a 2-cm RLL retrocardiac mass with right hilar adenopathy.  It was noted that the patient had no prior history of tobacco smoking.  Following the transfer, serial cardiac enzymes became positive for non-Q-wave myocardial infarction.  A cardiac workup at this point was pursued, and patient was seen in consultation by Dr. [name]. Nishan.  Patient had been transferred on heparin, given the concern of acute recurrent pulmonary embolus, and now beta blocker and nitroglycerin were added.  Of note, patient has history of aspirin allergy secondary to angioedema.  Dr. Eden Emms noted some new ST abnormalities in the precordial leads in conjunction with the abnormal enzymes.  He also noted the patient reported substernal chest discomfort with some relief with nitroglycerin.  He also noted the history of ______  and therefore recommended proceeding with definitive coronary angiography.  Coumadin was placed on hold.  Cardiac catheterization, performed April 18, 2000 by Dr. Veneda Melter (see cath report for full details), essentially revealed severe single-vessel CAD with a 95% proximal LAD lesion.  Preceding this, there was a 40% proximal/ostial lesion as well.  Left ventriculogram revealed severe anterior hypokinesis with inferior akinesis; EF approximately 40% with 1+ MR.  Dr. Chales Abrahams proceeded with successful stenting of the 95% lesion to 0% residual stenosis and no noted  complications.  He recommended treating with Plavix indefinitely.  Following the cardiac workup, attention was turned toward assessment of the RLL mass/hilar adenopathy, as well as the question of anticoagulation. Patient was seen in consultation by Dr. Salvatore Decent. Dorris Fetch, who recommended a wedge resection as the best option for a definitive diagnosis.  However, he noted that patient would have to wait some 4-6 weeks before proceeding with surgery, given her  myocardial infarction.  He also noted that the patient would need to be off both Coumadin and/or Plavix at least one week prior to the surgery.  Final recommendations at time of discharge were to continue Plavix until seen by Dr. Eden Emms, four weeks post discharge.  At that point, Dr. Eden Emms will consider resumption of Coumadin.  As noted earlier, patient is allergic to aspirin, given her history of angioedema.  Patient is also instructed to follow up with Dr. Onalee Hua B. Simonds and Dr. Dorris Fetch in about four weeks post discharge, as well.  Regarding medication adjustments, patient was only on Coumadin prior to this presentation.  Altace 2.5 q.d. was started at time of discharge and patient has been instructed to have a followup metabolic profile in one week.  MEDICATIONS AT DISCHARGE: 1. Plavix 75 mg q.d. 2. Toprol-XL 75 mg q.d. 3. Zocor 40 mg q.h.s. 4. Altace 2.5 mg q.d. 5. Nitrostat as directed.  DISCHARGE INSTRUCTIONS:  Patient is to not resume Coumadin until further instructions.  She is to refrain from any strenuous activity/driving until seen by her physician.  She is to maintain low-fat/-cholesterol diet.  She is to call the office if there is any swelling/bleeding of the groin.  FOLLOWUP:  Patient will follow up with Dr. Eden Emms in four weeks -- cardiology office will call with the scheduled appointment.  Patient is scheduled to follow up with Dr. Billy Fischer on Thursday, May 26, 2000, at 9:30 a.m.  The patient is also instructed to call and scheduled a followup appointment with Dr. Dorris Fetch in approximately four weeks.  DISCHARGE DIAGNOSES: 1. Acute non-Q-wave myocardial infarction.    a. Status post stent of 95% left anterior descending, April 18, 2000.    b. Moderate left ventricular dysfunction (ejection fraction approximately       40%). 2. Aspirin allergy -- angioedema. 3. History of pulmonary embolus/deep venous thrombosis, 1991.    a. Chronic  Coumadin. 4. Right lower lobe mass/right hilar adenopathy.  5. ______ . 6. Hypokalemia. DD:  04/20/00 TD:  04/20/00 Job: [id number]
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
## 2010-06-12 NOTE — Cardiovascular Report (Signed)
[place]  Patient:    Danielle Murphy, Danielle Murphy                            MRN: [id number] Proc. Date: 04/18/00 Adm. Date:  [id number] Attending:  Merwyn Katos CC:         [name]. Eden Emms, M.D. [facility]  Butch Penny, M.D.   Cardiac Catheterization  PROCEDURES PERFORMED: 1. Left heart catheterization. 2. Left ventriculogram. 3. Selective coronary angiography. 4. Stenting of the proximal left anterior descending artery.  DIAGNOSES: 1. Severe single-vessel coronary artery disease. 2. Mild left ventricular systolic dysfunction. 3. Status post non-Q-wave myocardial infarction.  HISTORY:  Ms. Danielle Murphy is a 70 year old female without prior cardiac history who presented to [facility] on March 20 with complaints of severe substernal chest discomfort and shortness of breath.  The patient has a history of deep venous thrombosis and pulmonary embolus, and she was subsequently admitted to the hospital and anticoagulated.  Further pulmonary assessment was negative for acute PE and during hospitalization, she developed new T wave inversions in the precordial leads as well as elevation of cardiac enzymes consistent with a non-Q-wave myocardial infarction.  She presents now for further cardiac assessment.  TECHNIQUE:  Informed consent was obtained.  The patient was brought to the catheterization lab.  A 6-French sheath was placed in the right femoral artery.  Selective angiography was then performed in the usual fashion as well as a left ventriculogram using power injections of contrast.  FINDINGS:  Initial findings are as follows: 1. Left main trunk:  Large caliber vessel with mild irregularities. 2. LAD:  This is a small caliber vessel that provides several small diagonal    branches in the mid section.  There are mild irregularities of 40% in the    ostium of the LAD.  There has been further severe narrowing of 95% in the    proximal segment at the takeoff of  a large first septal perforator.  This    septal perforator has severe narrowing of 95% at its ostium and has TIMI-2+    flow.  The distal LAD has a further narrowing of 40-50%. 3. Left circumflex artery:  This begins as a large caliber vessel and provides    a large first marginal branch in the proximal segment, and a smaller second    marginal branch distally.  The left circumflex system has mild    irregularities. 4. Right coronary artery:  Dominant.  This is a small caliber vessel that    provides a posterior descending artery and 2 small posterior ventricular    branches in its terminal segment.  The right coronary artery has luminal    irregularities.  LV:  Mildly dilated end systolic and end diastolic dimensions.  Overall left ventricular function is at least mildly impaired.  Ejection fraction approximately 40%.  There is severe hypokinesis of the distal anterior wall and the anterior apical walls.  Akinesis of the inferior apical wall is noted. Mitral regurgitation, 1+, is noted; however, this may be exacerbated by position of the pigtail catheter at the orifice in the mitral valve.  Overall LV function is mildly impaired.  Ejection fraction approximately 40%.  LV pressure was 112/10.  Aortic was 112/65.  LVEDP equals 28.  These findings were reviewed with the patient.  We elected to proceed with percutaneous intervention of the LAD.  The 6-French sheath was exchanged for a 7-French sheath,  and a 7-French JL4 guide catheter was used to engage the left coronary artery.  The patient had previously been on heparin and Integrilin. This was supplemented to maintain an ACT of approximately 300 seconds.  A 0.014-inch Forte wire was advanced to the distal LAD, and a 2.5- x 18-mm AVE S660 was primarily deployed at 16 atmospheres for 60 seconds.  Repeat angiography was then performed after the administration of intracoronary nitroglycerin showing an excellent result with no residual  stenosis and TIMI-3 flow throughout the LAD system as well as in the first septal perforator.  The first septal perforator had residual narrowing that was extremely high grade, and it was felt that attention to this may be beneficial.  The Forte wire was repositioned in the first septal perforator, and a CrossSail 2.0- x 15-mm balloon was introduced.  This was used to dilate the ostium of the septal perforator through the stent struts at 6 atmospheres for 90 seconds.  Repeat angiography showed only mild improvement in vessel lumen with approximately 60-70% residual stenosis; however, there was TIMI-3 flow through the septal perforator.  There was no evidence of damage to the LAD.  Final angiography was then performed in various projections after the administration of intracoronary nitroglycerin showing no residual stenosis in the LAD at the site of severe narrowing and TIMI-3 flow throughout the vessel.  There was no distal vessel damage.  The guide catheter was then removed, and the sheath was secured in position.  The patient tolerated the procedure well.  She was transferred to the floor in stable condition.  FINAL RESULT:  Successful primary stenting of the proximal LAD with a reduction of 95% narrowing to 0% with placement of a 2.5- x 15-mm AVE S660 stent.  ASSESSMENT AND PLAN:  Danielle Murphy is a 70 year old female status post non-Q-wave myocardial infarction and high-grade lesion in the LAD.  Given the small elevation of her cardiac enzymes, hopefully the majority of her LV function will improve.  Further assessment of the patients LV function and mitral regurgitation may be made with echocardiogram once she has recovered from her procedure.  In view of the patients allergy to aspirin, consideration should be given towards indefinite administration of Plavix. DD:  04/18/00 TD:  04/18/00 Job: [id number]

## 2010-06-12 NOTE — Op Note (Signed)
[place]. [facility]  Patient:    Danielle Murphy, Danielle Murphy                            MRN: [id number] Proc. Date: 06/10/00 Adm. Date:  [id number] Attending:  Charlett Lango CC:         Butch Penny, M.D.  Oley Balm Sung Amabile, M.D. [facility]  Theron Arista C. Eden Emms, M.D. [facility]   Operative Report  PREOPERATIVE DIAGNOSIS:  Right lower lobe mass.  POSTOPERATIVE DIAGNOSIS:  Benign pericardial cyst.  OPERATION PERFORMED:  Bronchoscopy, right thoracotomy, excision and drainage of pericardial cyst.  SURGEON:  Salvatore Decent. Dorris Fetch, M.D.  ASSISTANTTollie Pizza. Thomasena Edis, P.A.  ANESTHESIA:  General.  OPERATIVE FINDINGS:  Enlarged hyperplastic lymph nodes, no tumor seen on frozen section, no parenchymal lung mass.  pericardial cyst adjacent to inferior pulmonary vein.  Thin-walled, clear fluid, cyst wall sent for permanent section.  INDICATIONS FOR PROCEDURE:  The patient is a 70 year old female with a history of deep vein thrombosis and pulmonary embolus.  She presented with chest pain and had a CT to rule out acute pulmonary embolus which revealed a 2 cm rounded mass in the right lower lobe as well as hilar adenopathy.  She subsequently ruled in for myocardial infarction and underwent PCA and stenting of her LAD. It was elected to wait six weeks prior to intervening on her lung.  The patient was advised of the differential diagnosis of the lung mass.  She was advised of the alternative of biopsy with definitive treatment versus waiting and following with repeat CT scans over several years.  She chose to pursue surgery at this time.  The indications, risks, benefits and alternatives were discussed in detail with the patient and she understood and accepted the risks and agreed to proceed.  DESCRIPTION OF PROCEDURE:  The patient was brought to the preop holding area on Jun 10, 2000.  Lines were placed for intravenous antibiotics and arterial blood pressure monitoring.  The patient was  taken to the operating room, anesthetized and intubated with a single lumen endotracheal tube.  Flexible fiberoptic bronchoscopy was performed.  There were no endobronchial lesions and normal endobronchial anatomy.  The patient then was reintubated with a double lumen endotracheal tube.  PAS hose were placed and a Foley catheter was placed.  The patient was placed in left lateral decubitus position and the right chest was prepped and draped in the usual fashion.  A small right posterolateral thoracotomy was performed.  Incision was placed one fingerbreadth below the tip of the scapula.  The latissimus was divided, the serratus was spared and retracted anteriorly.  The fifth interspace was identified and the intercostal muscles were divided.  The fifth rib was shingled posteriorly removing a small segment to allow improved exposure.  The chest was entered.  There was single lung ventilation of the left lung being carried out and there was good deflation of the right lung.  The chest was inspected.  There was no effusion and no implants on the visceral parietal pleura.  Palpation of the lung did not reveal a mass palpable anywhere in the lung.  The lobar bronchi were very prominent and easily palpated.  The lung was mobilized laterally.  Felt a possible mass posteriorly in the right lower lobe trying to improve exposure and allow better palpation of this area.  The major fissure was completed with a stapler.  A tear occurred in the pulmonary arterial branches  to the superior segment of the right lower lobe significant bleeding ensued.  This was controlled with digital pressure and then the tear was oversewn with a 4-0 Prolene suture.  The patient did require transfusion of two units of packed red blood cells during control of this bleeding.  The bleeding was well controlled after that, no additional blood products were used.  ____________  hilum was mobilized, the inferior pulmonary  ligament was divided up to the level of the inferior pulmonary vein and at this point it could be seen that there was clearly a pericardial cyst which was adjacent to the inferior pulmonary vein.  The lung was carefully palpated once again to ensure that there were no other lesions which could account for the CT findings.  The cyst was in the correct location and was consistent with the findings on CT.  The cyst wall was incised, intrapericardial structures could clearly be identified.  The cyst was well away from the esophagus.  The cyst wall was excised and sent for permanent section.  Within the major fissure there was an enlarged level 1 lymph node.  This lymph node was dissected out and sent for frozen section, showed hyperplastic node but no tumor was seen.  Given the benign pericardial cyst and the benign lymph nodes, no further palpable masses within the lung, the chest was irrigated with warm normal saline containing 1 gm of vancomycin.  Fibroseal was placed over the staple lines on the major fissure and a small tear at the major fissure which occurred during the control of the bleeding.  A 36 French right angle chest tube was placed through a separate subcostal incision directed posteriorly into the apex and secured with a #1 silk suture.  After ensuring good hemostasis throughout, the ribs were reapproximated with #2 Vicryl pericostal sutures.  The serosa was reattached laterally with a 0 Vicryl suture.  The latissimus was closed with running 0 Vicryl sutures.  The subcutaneous tissue was closed with running 2-0 Vicryl suture and the skin was closed with a 3-0 Vicryl subcuticular suture.  Sponge, needle and instrument counts were correct at the end of the procedure.  There were no intraoperative complications.  The patient was taken from the operating room to the post anesthesia care unit intubated in stable condition. DD:  06/10/00 TD:  06/13/00 Job: [id number]  [id number]

## 2010-06-17 ENCOUNTER — Ambulatory Visit (INDEPENDENT_AMBULATORY_CARE_PROVIDER_SITE_OTHER): Payer: 59 | Admitting: *Deleted

## 2010-06-17 DIAGNOSIS — I82409 Acute embolism and thrombosis of unspecified deep veins of unspecified lower extremity: Secondary | ICD-10-CM

## 2010-06-17 DIAGNOSIS — Z7901 Long term (current) use of anticoagulants: Secondary | ICD-10-CM

## 2010-06-17 LAB — POCT INR: INR: 2.6

## 2010-07-15 ENCOUNTER — Ambulatory Visit (INDEPENDENT_AMBULATORY_CARE_PROVIDER_SITE_OTHER): Payer: 59 | Admitting: *Deleted

## 2010-07-15 ENCOUNTER — Other Ambulatory Visit: Payer: Self-pay

## 2010-07-15 DIAGNOSIS — Z7901 Long term (current) use of anticoagulants: Secondary | ICD-10-CM

## 2010-07-15 DIAGNOSIS — I82409 Acute embolism and thrombosis of unspecified deep veins of unspecified lower extremity: Secondary | ICD-10-CM

## 2010-07-15 LAB — POCT INR: INR: 1.7

## 2010-07-15 MED ORDER — WARFARIN SODIUM 4 MG PO TABS: 4.0000 mg | ORAL_TABLET | ORAL | Status: DC

## 2010-07-15 MED ORDER — LOSARTAN POTASSIUM 50 MG PO TABS: 50.0000 mg | ORAL_TABLET | Freq: Every day | ORAL | Status: DC

## 2010-07-15 MED ORDER — METOPROLOL SUCCINATE ER 50 MG PO TB24: 75.0000 mg | ORAL_TABLET | Freq: Every day | ORAL | Status: DC

## 2010-07-15 MED ORDER — PRAVASTATIN SODIUM 20 MG PO TABS: 20.0000 mg | ORAL_TABLET | Freq: Every day | ORAL | Status: DC

## 2010-07-15 MED ORDER — EZETIMIBE 10 MG PO TABS: 10.0000 mg | ORAL_TABLET | Freq: Every day | ORAL | Status: DC

## 2010-08-04 ENCOUNTER — Encounter: Payer: Self-pay | Admitting: *Deleted

## 2010-08-12 ENCOUNTER — Ambulatory Visit (INDEPENDENT_AMBULATORY_CARE_PROVIDER_SITE_OTHER): Payer: 59 | Admitting: *Deleted

## 2010-08-12 DIAGNOSIS — Z7901 Long term (current) use of anticoagulants: Secondary | ICD-10-CM

## 2010-08-12 DIAGNOSIS — I82409 Acute embolism and thrombosis of unspecified deep veins of unspecified lower extremity: Secondary | ICD-10-CM

## 2010-08-12 LAB — POCT INR: INR: 2.9

## 2010-09-02 ENCOUNTER — Encounter: Payer: 59 | Admitting: *Deleted

## 2010-09-03 ENCOUNTER — Ambulatory Visit (INDEPENDENT_AMBULATORY_CARE_PROVIDER_SITE_OTHER): Payer: 59 | Admitting: *Deleted

## 2010-09-03 DIAGNOSIS — I82409 Acute embolism and thrombosis of unspecified deep veins of unspecified lower extremity: Secondary | ICD-10-CM

## 2010-09-03 DIAGNOSIS — Z7901 Long term (current) use of anticoagulants: Secondary | ICD-10-CM

## 2010-09-03 LAB — POCT INR: INR: 1.7

## 2010-09-23 ENCOUNTER — Ambulatory Visit (INDEPENDENT_AMBULATORY_CARE_PROVIDER_SITE_OTHER): Payer: 59 | Admitting: *Deleted

## 2010-09-23 DIAGNOSIS — Z7901 Long term (current) use of anticoagulants: Secondary | ICD-10-CM

## 2010-09-23 DIAGNOSIS — I82409 Acute embolism and thrombosis of unspecified deep veins of unspecified lower extremity: Secondary | ICD-10-CM

## 2010-09-23 LAB — POCT INR: INR: 2.1

## 2010-10-12 ENCOUNTER — Encounter: Payer: Self-pay | Admitting: Cardiology

## 2010-10-14 ENCOUNTER — Ambulatory Visit (INDEPENDENT_AMBULATORY_CARE_PROVIDER_SITE_OTHER): Payer: 59 | Admitting: Cardiology

## 2010-10-14 ENCOUNTER — Encounter: Payer: Self-pay | Admitting: Cardiology

## 2010-10-14 VITALS — BP 168/73 | HR 57 | Ht 63.0 in | Wt 141.0 lb

## 2010-10-14 DIAGNOSIS — I359 Nonrheumatic aortic valve disorder, unspecified: Secondary | ICD-10-CM

## 2010-10-14 DIAGNOSIS — I2699 Other pulmonary embolism without acute cor pulmonale: Secondary | ICD-10-CM

## 2010-10-14 DIAGNOSIS — I2510 Atherosclerotic heart disease of native coronary artery without angina pectoris: Secondary | ICD-10-CM

## 2010-10-14 DIAGNOSIS — I10 Essential (primary) hypertension: Secondary | ICD-10-CM

## 2010-10-14 NOTE — Assessment & Plan Note (Signed)
Mild aortic valve stenosis, murmur stable on exam.

## 2010-10-14 NOTE — Patient Instructions (Signed)
Your physician recommends that you continue on your current medications as directed. Please refer to the Current Medication list given to you today.  Your physician recommends that you schedule a follow-up appointment in: 6 months  

## 2010-10-14 NOTE — Progress Notes (Signed)
Clinical Summary Danielle Murphy is a 70 y.o.female presenting for followup. She was seen in March.  She reports doing well, no significant angina or exertional shortness of breath. She has had some foot problems that have limited her ambulation. For this reason she did not want to proceed with a followup exercise Myoview. For now in fact, she prefers observation on medical therapy.  Blood pressure was elevated today, although she states that when she checks it at home, the systolic is usually in the 120 to 130 range. I asked her to keep a close eye on this.  Dr. Renard Murphy continues to follow her lipids.   Allergies  Allergen Reactions  . Aspirin   . Niacin   . Ramipril   . Simvastatin     Medication list reviewed.  Past Medical History  Diagnosis Date  . Coronary atherosclerosis of native coronary artery     BMS LAD 2002  . Hyperlipidemia   . Essential hypertension, benign   . Aortic stenosis   . Pericardial cyst     Status post excision 2002  . Pulmonary embolism   . DVT (deep venous thrombosis)     Past Surgical History  Procedure Date  . Pericardial cyst excision 2002    No family history on file.  Social History Danielle Murphy reports that she has never smoked. She has never used smokeless tobacco. Danielle Murphy reports that she does not drink alcohol.  Review of Systems Reports no palpitations, no bleeding problems. Otherwise reviewed and negative.  Physical Examination Filed Vitals:   10/14/10 1035  BP: 168/73  Pulse: 57   Patient is comfortable and in no acute distress.  HEENT: Conjunctiva and lids are normal, oropharynx clear.  Neck: Supple no elevated jugular venous pressure or carotid bruits.  Lungs: Clear without labored breathing at rest.  Cardiac: Regular rate and rhythm with 2/6 systolic murmur at the base, preserved second heart sound. No S3 gallop or pericardial rub.  Abdomen: Soft and nontender. No hepatomegaly or bruits.  Extremities: No pitting edema. Distal  pulses are 2+. Mild venous stasis.  Musculoskeletal: No kyphosis noted.  Neuropsychiatric: Alert and oriented x3. Affect is normal.   ECG Sinus bradycardia at 57 BPM.    Problem List and Plan

## 2010-10-14 NOTE — Assessment & Plan Note (Signed)
She continues on Coumadin, no bleeding problems. 

## 2010-10-14 NOTE — Assessment & Plan Note (Signed)
Continue observation on medical therapy. She prefers to hold off followup stress testing. We discussed warning signs and symptoms.

## 2010-10-14 NOTE — Assessment & Plan Note (Signed)
Blood pressure elevated today, although sounds better controlled at home. I asked her to bring in her blood pressure log for her next visit.

## 2010-10-16 LAB — CARDIAC PANEL(CRET KIN+CKTOT+MB+TROPI)
CK, MB: 0.8
Relative Index: INVALID
Total CK: 75
Troponin I: 0.02

## 2010-10-16 LAB — BASIC METABOLIC PANEL
BUN: 15
Calcium: 9.4
Creatinine, Ser: 0.88
GFR calc non Af Amer: >60

## 2010-10-16 LAB — CK: Total CK: 76

## 2010-10-16 LAB — PROTIME-INR
Prothrombin Time: 33.4 — ABNORMAL HIGH
Prothrombin Time: 34.5 — ABNORMAL HIGH

## 2010-10-16 LAB — POCT CARDIAC MARKERS
Myoglobin, poc: 64.6
Operator id: 106841
Operator id: 215201

## 2010-10-16 LAB — DIFFERENTIAL
Eosinophils Absolute: 0.5
Lymphocytes Relative: 38
Lymphs Abs: 2.3
Neutrophils Relative %: 45

## 2010-10-16 LAB — CBC
MCV: 88.9
Platelets: 309
WBC: 6.1

## 2010-10-16 LAB — TROPONIN I: Troponin I: 0.05

## 2010-10-21 ENCOUNTER — Ambulatory Visit (INDEPENDENT_AMBULATORY_CARE_PROVIDER_SITE_OTHER): Payer: 59 | Admitting: *Deleted

## 2010-10-21 DIAGNOSIS — Z7901 Long term (current) use of anticoagulants: Secondary | ICD-10-CM

## 2010-10-21 DIAGNOSIS — I82409 Acute embolism and thrombosis of unspecified deep veins of unspecified lower extremity: Secondary | ICD-10-CM

## 2010-11-18 ENCOUNTER — Ambulatory Visit (INDEPENDENT_AMBULATORY_CARE_PROVIDER_SITE_OTHER): Payer: 59 | Admitting: *Deleted

## 2010-11-18 DIAGNOSIS — I82409 Acute embolism and thrombosis of unspecified deep veins of unspecified lower extremity: Secondary | ICD-10-CM

## 2010-11-18 DIAGNOSIS — Z7901 Long term (current) use of anticoagulants: Secondary | ICD-10-CM

## 2010-11-18 LAB — POCT INR: INR: 2.3

## 2010-12-16 ENCOUNTER — Ambulatory Visit (INDEPENDENT_AMBULATORY_CARE_PROVIDER_SITE_OTHER): Payer: 59 | Admitting: *Deleted

## 2010-12-16 DIAGNOSIS — I82409 Acute embolism and thrombosis of unspecified deep veins of unspecified lower extremity: Secondary | ICD-10-CM

## 2010-12-16 DIAGNOSIS — Z7901 Long term (current) use of anticoagulants: Secondary | ICD-10-CM

## 2010-12-16 LAB — POCT INR: INR: 2.3

## 2011-01-13 ENCOUNTER — Ambulatory Visit (INDEPENDENT_AMBULATORY_CARE_PROVIDER_SITE_OTHER): Payer: 59 | Admitting: *Deleted

## 2011-01-13 DIAGNOSIS — Z7901 Long term (current) use of anticoagulants: Secondary | ICD-10-CM

## 2011-01-13 DIAGNOSIS — I82409 Acute embolism and thrombosis of unspecified deep veins of unspecified lower extremity: Secondary | ICD-10-CM

## 2011-01-13 LAB — POCT INR: INR: 2.5

## 2011-02-24 ENCOUNTER — Ambulatory Visit (INDEPENDENT_AMBULATORY_CARE_PROVIDER_SITE_OTHER): Payer: 59 | Admitting: *Deleted

## 2011-02-24 DIAGNOSIS — I82409 Acute embolism and thrombosis of unspecified deep veins of unspecified lower extremity: Secondary | ICD-10-CM

## 2011-02-24 DIAGNOSIS — Z7901 Long term (current) use of anticoagulants: Secondary | ICD-10-CM

## 2011-02-24 LAB — POCT INR: INR: 2.6

## 2011-04-07 ENCOUNTER — Ambulatory Visit (INDEPENDENT_AMBULATORY_CARE_PROVIDER_SITE_OTHER): Payer: 59 | Admitting: *Deleted

## 2011-04-07 ENCOUNTER — Other Ambulatory Visit: Payer: Self-pay

## 2011-04-07 DIAGNOSIS — Z7901 Long term (current) use of anticoagulants: Secondary | ICD-10-CM

## 2011-04-07 DIAGNOSIS — I82409 Acute embolism and thrombosis of unspecified deep veins of unspecified lower extremity: Secondary | ICD-10-CM

## 2011-04-07 LAB — POCT INR: INR: 2.3

## 2011-04-07 MED ORDER — LOSARTAN POTASSIUM 50 MG PO TABS: 50.0000 mg | ORAL_TABLET | Freq: Every day | ORAL | Status: DC

## 2011-04-07 MED ORDER — PRAVASTATIN SODIUM 20 MG PO TABS: 20.0000 mg | ORAL_TABLET | Freq: Every day | ORAL | Status: DC

## 2011-04-07 MED ORDER — EZETIMIBE 10 MG PO TABS: 10.0000 mg | ORAL_TABLET | Freq: Every day | ORAL | Status: DC

## 2011-04-07 MED ORDER — METOPROLOL SUCCINATE ER 50 MG PO TB24: ORAL_TABLET | ORAL | Status: DC

## 2011-04-07 MED ORDER — WARFARIN SODIUM 4 MG PO TABS: 4.0000 mg | ORAL_TABLET | ORAL | Status: DC

## 2011-04-08 ENCOUNTER — Other Ambulatory Visit: Payer: Self-pay

## 2011-04-12 ENCOUNTER — Other Ambulatory Visit: Payer: Self-pay

## 2011-04-12 MED ORDER — METOPROLOL SUCCINATE ER 50 MG PO TB24: ORAL_TABLET | ORAL | Status: DC

## 2011-04-12 MED ORDER — EZETIMIBE 10 MG PO TABS: 10.0000 mg | ORAL_TABLET | Freq: Every day | ORAL | Status: DC

## 2011-04-12 MED ORDER — LOSARTAN POTASSIUM 50 MG PO TABS: 50.0000 mg | ORAL_TABLET | Freq: Every day | ORAL | Status: DC

## 2011-04-12 MED ORDER — PRAVASTATIN SODIUM 20 MG PO TABS: 20.0000 mg | ORAL_TABLET | Freq: Every day | ORAL | Status: DC

## 2011-04-12 MED ORDER — WARFARIN SODIUM 4 MG PO TABS: 4.0000 mg | ORAL_TABLET | ORAL | Status: DC

## 2011-04-13 ENCOUNTER — Other Ambulatory Visit: Payer: Self-pay | Admitting: *Deleted

## 2011-04-13 MED ORDER — EZETIMIBE 10 MG PO TABS: 10.0000 mg | ORAL_TABLET | Freq: Every day | ORAL | Status: DC

## 2011-04-13 MED ORDER — METOPROLOL SUCCINATE ER 50 MG PO TB24: ORAL_TABLET | ORAL | Status: DC

## 2011-04-13 MED ORDER — LOSARTAN POTASSIUM 50 MG PO TABS: 50.0000 mg | ORAL_TABLET | Freq: Every day | ORAL | Status: DC

## 2011-04-13 MED ORDER — PRAVASTATIN SODIUM 20 MG PO TABS: 20.0000 mg | ORAL_TABLET | Freq: Every day | ORAL | Status: DC

## 2011-04-13 MED ORDER — WARFARIN SODIUM 4 MG PO TABS: 4.0000 mg | ORAL_TABLET | ORAL | Status: DC

## 2011-04-19 ENCOUNTER — Other Ambulatory Visit: Payer: Self-pay

## 2011-04-19 DIAGNOSIS — I2510 Atherosclerotic heart disease of native coronary artery without angina pectoris: Secondary | ICD-10-CM

## 2011-04-21 ENCOUNTER — Encounter (HOSPITAL_COMMUNITY): Admission: RE | Admit: 2011-04-21 | Payer: Medicare PPO | Source: Ambulatory Visit

## 2011-04-21 ENCOUNTER — Ambulatory Visit: Payer: 59 | Admitting: Cardiology

## 2011-04-21 ENCOUNTER — Encounter (HOSPITAL_COMMUNITY)
Admission: RE | Admit: 2011-04-21 | Discharge: 2011-04-21 | Disposition: A | Discharge status: Home / Self Care | Payer: Medicare PPO | Source: Ambulatory Visit | Attending: Cardiology | Admitting: Cardiology

## 2011-04-21 ENCOUNTER — Other Ambulatory Visit: Payer: Self-pay | Admitting: Cardiology

## 2011-04-21 ENCOUNTER — Encounter (HOSPITAL_COMMUNITY): Payer: Self-pay

## 2011-04-21 DIAGNOSIS — I2510 Atherosclerotic heart disease of native coronary artery without angina pectoris: Secondary | ICD-10-CM

## 2011-04-21 MED ORDER — TECHNETIUM TC 99M TETROFOSMIN IV KIT
10.0000 | PACK | Freq: Once | INTRAVENOUS | Status: AC | PRN
  Administered 2011-04-21: 10 via INTRAVENOUS

## 2011-04-27 ENCOUNTER — Encounter (HOSPITAL_COMMUNITY)
Admission: RE | Admit: 2011-04-27 | Discharge: 2011-04-27 | Disposition: A | Discharge status: Home / Self Care | Payer: Medicare PPO | Source: Ambulatory Visit | Attending: Cardiology | Admitting: Cardiology

## 2011-04-27 ENCOUNTER — Encounter (HOSPITAL_COMMUNITY): Payer: Self-pay | Admitting: Cardiology

## 2011-04-27 ENCOUNTER — Ambulatory Visit (INDEPENDENT_AMBULATORY_CARE_PROVIDER_SITE_OTHER): Payer: Medicare PPO

## 2011-04-27 DIAGNOSIS — I2510 Atherosclerotic heart disease of native coronary artery without angina pectoris: Secondary | ICD-10-CM

## 2011-04-27 MED ORDER — TECHNETIUM TC 99M TETROFOSMIN IV KIT
30.0000 | PACK | Freq: Once | INTRAVENOUS | Status: AC | PRN
  Administered 2011-04-27: 32 via INTRAVENOUS

## 2011-04-27 NOTE — Progress Notes (Signed)
Stress Lab Nurses Notes - Danielle Murphy  Danielle Murphy 04/27/2011  Reason for doing test: CAD Type of test: Stress Myoview: 2 Day Study Nurse performing test: Parke Poisson, RN Nuclear Medicine Tech: Lyndel Pleasure Echo Tech: Not Applicable MD performing test: R. Rothbart Family MD: McInnis Test explained and consent signed: yes IV started: 22g jelco, Saline lock flushed and No redness or edema Symptoms: Fatigue Treatment/Intervention: None Reason test stopped: fatigue and reached target HR After recovery IV was: Discontinued via X-ray tech and No redness or edema Patient to return to Nuc. Med at : 12:15 Patient discharged: Home Patient's Condition upon discharge was: stable Comments: During exercise peak BP 190/62 & HR 139.  Recovery BP 148/68 & HR 70.  Symptoms resolved in recovery. Erskine Speed T

## 2011-05-05 ENCOUNTER — Encounter: Payer: Self-pay | Admitting: Cardiology

## 2011-05-05 ENCOUNTER — Ambulatory Visit (INDEPENDENT_AMBULATORY_CARE_PROVIDER_SITE_OTHER): Payer: Medicare PPO | Admitting: Cardiology

## 2011-05-05 VITALS — BP 154/69 | HR 60 | Resp 16 | Ht 63.0 in | Wt 138.0 lb

## 2011-05-05 DIAGNOSIS — I359 Nonrheumatic aortic valve disorder, unspecified: Secondary | ICD-10-CM

## 2011-05-05 DIAGNOSIS — I2510 Atherosclerotic heart disease of native coronary artery without angina pectoris: Secondary | ICD-10-CM

## 2011-05-05 DIAGNOSIS — I10 Essential (primary) hypertension: Secondary | ICD-10-CM

## 2011-05-05 DIAGNOSIS — E785 Hyperlipidemia, unspecified: Secondary | ICD-10-CM

## 2011-05-05 NOTE — Progress Notes (Signed)
   Clinical Summary Danielle Murphy is a 71 y.o.female presenting for followup. She was seen in September 2012.  She underwent a followup exercise Myoview in March 2013 which showed no diagnostic ST segment changes, no perfusion evidence of ischemia, LVEF greater than 65%.  She reports no angina, feels well, still walking regularly for exercise. Reports compliance with her medications. She is due for a followup physical with lab work per Dr. Renard Matter.  We discussed her stress test results, continued observation for now.   Allergies  Allergen Reactions  . Aspirin   . Niacin   . Ramipril   . Simvastatin     Current Outpatient Prescriptions  Medication Sig Dispense Refill  . aspirin 81 MG tablet Take 81 mg by mouth daily.        . calcium gluconate 500 MG tablet Take 500 mg by mouth daily.        Marland Kitchen ezetimibe (ZETIA) 10 MG tablet Take 1 tablet (10 mg total) by mouth daily.  90 tablet  3  . losartan (COZAAR) 50 MG tablet Take 1 tablet (50 mg total) by mouth daily.  90 tablet  3  . metoprolol succinate (TOPROL XL) 50 MG 24 hr tablet Take 1 1/2 tablets daily  135 tablet  3  . nitroGLYCERIN (NITROSTAT) 0.4 MG SL tablet Place 0.4 mg under the tongue every 5 (five) minutes as needed.        . pravastatin (PRAVACHOL) 20 MG tablet Take 1 tablet (20 mg total) by mouth daily.  90 tablet  3  . warfarin (COUMADIN) 4 MG tablet Take 1 tablet (4 mg total) by mouth as directed.  90 tablet  0    Past Medical History  Diagnosis Date  . Coronary atherosclerosis of native coronary artery     BMS LAD 2002  . Hyperlipidemia   . Essential hypertension, benign   . Aortic stenosis   . Pericardial cyst     Status post excision 2002  . Pulmonary embolism   . DVT (deep venous thrombosis)     Social History Danielle Murphy reports that she has never smoked. She has never used smokeless tobacco. Danielle Murphy reports that she does not drink alcohol.  Review of Systems No palpitations, no bleeding problems. No syncope.  Stable appetite. Otherwise negative.  Physical Examination Filed Vitals:   05/05/11 0845  BP: 154/69  Pulse: 60  Resp: 16    Patient is comfortable and in no acute distress.  HEENT: Conjunctiva and lids are normal, oropharynx clear.  Neck: Supple no elevated jugular venous pressure or carotid bruits.  Lungs: Clear without labored breathing at rest.  Cardiac: Regular rate and rhythm with 2/6 systolic murmur at the base, preserved second heart sound. No S3 gallop or pericardial rub.  Abdomen: Soft and nontender. No hepatomegaly or bruits.  Extremities: No pitting edema. Distal pulses are 2+. Mild venous stasis.  Musculoskeletal: No kyphosis noted.  Neuropsychiatric: Alert and oriented x3. Affect is normal.    Problem List and Plan

## 2011-05-05 NOTE — Patient Instructions (Signed)
**Note De-identified  Obfuscation** Your physician recommends that you continue on your current medications as directed. Please refer to the Current Medication list given to you today.  Your physician recommends that you schedule a follow-up appointment in: 6 months  

## 2011-05-05 NOTE — Assessment & Plan Note (Signed)
Symptomatically stable on present medical therapy. She is walking now regularly for exercise. Recent followup Myoview showed no active ischemia. Continue observation, discussed warning signs.

## 2011-05-05 NOTE — Assessment & Plan Note (Signed)
Due for followup lipid profile and liver function tests. She is tolerating Zetia and low-dose Pravachol. This will be arranged per Dr. Renard Matter.

## 2011-05-05 NOTE — Assessment & Plan Note (Signed)
Blood pressure is up today. Continue medical therapy, sodium restriction and exercise discussed.

## 2011-05-05 NOTE — Assessment & Plan Note (Signed)
Mild aortic valve stenosis, murmur stable on exam. 

## 2011-05-19 ENCOUNTER — Ambulatory Visit (INDEPENDENT_AMBULATORY_CARE_PROVIDER_SITE_OTHER): Payer: Medicare PPO | Admitting: *Deleted

## 2011-05-19 DIAGNOSIS — I82409 Acute embolism and thrombosis of unspecified deep veins of unspecified lower extremity: Secondary | ICD-10-CM

## 2011-05-19 DIAGNOSIS — Z7901 Long term (current) use of anticoagulants: Secondary | ICD-10-CM

## 2011-06-30 ENCOUNTER — Ambulatory Visit (INDEPENDENT_AMBULATORY_CARE_PROVIDER_SITE_OTHER): Payer: Medicare PPO | Admitting: *Deleted

## 2011-06-30 DIAGNOSIS — Z7901 Long term (current) use of anticoagulants: Secondary | ICD-10-CM

## 2011-06-30 DIAGNOSIS — I82409 Acute embolism and thrombosis of unspecified deep veins of unspecified lower extremity: Secondary | ICD-10-CM

## 2011-06-30 MED ORDER — WARFARIN SODIUM 4 MG PO TABS: 4.0000 mg | ORAL_TABLET | ORAL | Status: DC

## 2011-08-11 ENCOUNTER — Ambulatory Visit (INDEPENDENT_AMBULATORY_CARE_PROVIDER_SITE_OTHER): Payer: Medicare PPO | Admitting: *Deleted

## 2011-08-11 DIAGNOSIS — Z7901 Long term (current) use of anticoagulants: Secondary | ICD-10-CM

## 2011-08-11 DIAGNOSIS — I82409 Acute embolism and thrombosis of unspecified deep veins of unspecified lower extremity: Secondary | ICD-10-CM

## 2011-09-23 ENCOUNTER — Ambulatory Visit (INDEPENDENT_AMBULATORY_CARE_PROVIDER_SITE_OTHER): Payer: Medicare PPO | Admitting: *Deleted

## 2011-09-23 DIAGNOSIS — Z7901 Long term (current) use of anticoagulants: Secondary | ICD-10-CM

## 2011-09-23 DIAGNOSIS — I82409 Acute embolism and thrombosis of unspecified deep veins of unspecified lower extremity: Secondary | ICD-10-CM

## 2011-11-04 ENCOUNTER — Ambulatory Visit (INDEPENDENT_AMBULATORY_CARE_PROVIDER_SITE_OTHER): Payer: Medicare PPO | Admitting: *Deleted

## 2011-11-04 DIAGNOSIS — Z7901 Long term (current) use of anticoagulants: Secondary | ICD-10-CM

## 2011-11-04 DIAGNOSIS — I82409 Acute embolism and thrombosis of unspecified deep veins of unspecified lower extremity: Secondary | ICD-10-CM

## 2011-11-10 ENCOUNTER — Encounter: Payer: Self-pay | Admitting: Cardiology

## 2011-11-10 ENCOUNTER — Ambulatory Visit (INDEPENDENT_AMBULATORY_CARE_PROVIDER_SITE_OTHER): Payer: Medicare PPO | Admitting: Cardiology

## 2011-11-10 VITALS — BP 142/72 | HR 62 | Wt 135.0 lb

## 2011-11-10 DIAGNOSIS — I10 Essential (primary) hypertension: Secondary | ICD-10-CM

## 2011-11-10 DIAGNOSIS — I2510 Atherosclerotic heart disease of native coronary artery without angina pectoris: Secondary | ICD-10-CM

## 2011-11-10 DIAGNOSIS — I2699 Other pulmonary embolism without acute cor pulmonale: Secondary | ICD-10-CM

## 2011-11-10 DIAGNOSIS — I359 Nonrheumatic aortic valve disorder, unspecified: Secondary | ICD-10-CM

## 2011-11-10 NOTE — Progress Notes (Signed)
   Clinical Summary Danielle Murphy is a 71 y.o.female presenting for followup. She was seen in April. She reports no angina or progressive dyspnea. Will be having physical with Dr. Renard Matter in November and have lipids at that time.  ECG today shows sinus rhythm with PR 210 ms.  Myoview earlier this year showed no active ischemia with normal LVEF.  Allergies  Allergen Reactions  . Aspirin   . Niacin   . Ramipril   . Simvastatin     Current Outpatient Prescriptions  Medication Sig Dispense Refill  . aspirin 81 MG tablet Take 81 mg by mouth daily.        . calcium gluconate 500 MG tablet Take 500 mg by mouth daily.        Marland Kitchen ezetimibe (ZETIA) 10 MG tablet Take 1 tablet (10 mg total) by mouth daily.  90 tablet  3  . losartan (COZAAR) 50 MG tablet Take 1 tablet (50 mg total) by mouth daily.  90 tablet  3  . metoprolol succinate (TOPROL XL) 50 MG 24 hr tablet Take 1 1/2 tablets daily  135 tablet  3  . nitroGLYCERIN (NITROSTAT) 0.4 MG SL tablet Place 0.4 mg under the tongue every 5 (five) minutes as needed.        . pravastatin (PRAVACHOL) 20 MG tablet Take 1 tablet (20 mg total) by mouth daily.  90 tablet  3  . warfarin (COUMADIN) 4 MG tablet Take 1 tablet (4 mg total) by mouth as directed.  90 tablet  3    Past Medical History  Diagnosis Date  . Coronary atherosclerosis of native coronary artery     BMS LAD 2002  . Hyperlipidemia   . Essential hypertension, benign   . Aortic stenosis   . Pericardial cyst     Status post excision 2002  . Pulmonary embolism   . DVT (deep venous thrombosis)     Social History Danielle Murphy reports that she has never smoked. She has never used smokeless tobacco. Danielle Murphy reports that she does not drink alcohol.  Review of Systems No palpitations or syncope. No bleeding episodes. Otherwise negative.  Physical Examination Filed Vitals:   11/10/11 1005  BP: 142/72  Pulse: 62   Filed Weights   11/10/11 1005  Weight: 135 lb (61.236 kg)    Patient is  comfortable and in no acute distress.  HEENT: Conjunctiva and lids are normal, oropharynx clear.  Neck: Supple no elevated jugular venous pressure or carotid bruits.  Lungs: Clear without labored breathing at rest.  Cardiac: Regular rate and rhythm with 2/6 systolic murmur at the base, preserved second heart sound. No S3 gallop or pericardial rub.  Abdomen: Soft and nontender. No hepatomegaly or bruits.  Extremities: No pitting edema. Distal pulses are 2+. Mild venous stasis.    Problem List and Plan   CORONARY ATHEROSCLEROSIS NATIVE CORONARY ARTERY Symptomatically stable with reassuring Myoview earlier this year. Continue medical therapy and observation.  AORTIC VALVE DISORDERS Mild aortic stenosis, no change in exam.  HYPERTENSION Blood pressure mildly elevated. Continue same regimen and activity.   Pulmonary embolus She continues on Coumadin, no bleeding problems.    Jonelle Sidle, M.D., F.A.C.C.

## 2011-11-10 NOTE — Patient Instructions (Signed)
Your physician recommends that you schedule a follow-up appointment in: 6 months  

## 2011-11-10 NOTE — Assessment & Plan Note (Signed)
Symptomatically stable with reassuring Myoview earlier this year. Continue medical therapy and observation.

## 2011-11-10 NOTE — Assessment & Plan Note (Signed)
She continues on Coumadin, no bleeding problems. 

## 2011-11-10 NOTE — Assessment & Plan Note (Signed)
Mild aortic stenosis, no change in exam.

## 2011-11-10 NOTE — Assessment & Plan Note (Signed)
Blood pressure mildly elevated. Continue same regimen and activity.

## 2011-12-16 ENCOUNTER — Ambulatory Visit (INDEPENDENT_AMBULATORY_CARE_PROVIDER_SITE_OTHER): Payer: Medicare PPO | Admitting: *Deleted

## 2011-12-16 DIAGNOSIS — Z7901 Long term (current) use of anticoagulants: Secondary | ICD-10-CM

## 2011-12-16 DIAGNOSIS — I82409 Acute embolism and thrombosis of unspecified deep veins of unspecified lower extremity: Secondary | ICD-10-CM

## 2012-01-06 ENCOUNTER — Other Ambulatory Visit (HOSPITAL_COMMUNITY): Payer: Self-pay | Admitting: Family Medicine

## 2012-01-06 DIAGNOSIS — Z139 Encounter for screening, unspecified: Secondary | ICD-10-CM

## 2012-01-06 DIAGNOSIS — M810 Age-related osteoporosis without current pathological fracture: Secondary | ICD-10-CM

## 2012-01-13 ENCOUNTER — Ambulatory Visit (HOSPITAL_COMMUNITY): Payer: Medicare PPO

## 2012-01-17 ENCOUNTER — Ambulatory Visit (HOSPITAL_COMMUNITY)
Admission: RE | Admit: 2012-01-17 | Discharge: 2012-01-17 | Disposition: A | Discharge status: Home / Self Care | Payer: Medicare PPO | Source: Ambulatory Visit | Attending: Family Medicine | Admitting: Family Medicine

## 2012-01-17 DIAGNOSIS — M899 Disorder of bone, unspecified: Secondary | ICD-10-CM | POA: Insufficient documentation

## 2012-01-17 DIAGNOSIS — M810 Age-related osteoporosis without current pathological fracture: Secondary | ICD-10-CM

## 2012-01-20 ENCOUNTER — Ambulatory Visit (HOSPITAL_COMMUNITY)
Admission: RE | Admit: 2012-01-20 | Discharge: 2012-01-20 | Disposition: A | Discharge status: Home / Self Care | Payer: Medicare PPO | Source: Ambulatory Visit | Attending: Family Medicine | Admitting: Family Medicine

## 2012-01-20 ENCOUNTER — Other Ambulatory Visit (HOSPITAL_COMMUNITY): Payer: Self-pay | Admitting: Family Medicine

## 2012-01-20 DIAGNOSIS — Z139 Encounter for screening, unspecified: Secondary | ICD-10-CM

## 2012-01-20 DIAGNOSIS — Z1231 Encounter for screening mammogram for malignant neoplasm of breast: Secondary | ICD-10-CM | POA: Insufficient documentation

## 2012-01-27 ENCOUNTER — Ambulatory Visit (INDEPENDENT_AMBULATORY_CARE_PROVIDER_SITE_OTHER): Payer: Medicare PPO | Admitting: *Deleted

## 2012-01-27 DIAGNOSIS — Z7901 Long term (current) use of anticoagulants: Secondary | ICD-10-CM

## 2012-01-27 DIAGNOSIS — I82409 Acute embolism and thrombosis of unspecified deep veins of unspecified lower extremity: Secondary | ICD-10-CM

## 2012-01-27 LAB — POCT INR: INR: 2.6

## 2012-02-21 ENCOUNTER — Other Ambulatory Visit: Payer: Self-pay | Admitting: Cardiology

## 2012-02-22 MED ORDER — METOPROLOL SUCCINATE ER 50 MG PO TB24: ORAL_TABLET | ORAL | Status: DC

## 2012-02-22 MED ORDER — EZETIMIBE 10 MG PO TABS: 10.0000 mg | ORAL_TABLET | Freq: Every day | ORAL | Status: DC

## 2012-02-22 MED ORDER — WARFARIN SODIUM 4 MG PO TABS: 4.0000 mg | ORAL_TABLET | ORAL | Status: DC

## 2012-02-22 MED ORDER — PRAVASTATIN SODIUM 20 MG PO TABS: 20.0000 mg | ORAL_TABLET | Freq: Every day | ORAL | Status: DC

## 2012-02-22 MED ORDER — LOSARTAN POTASSIUM 50 MG PO TABS: 50.0000 mg | ORAL_TABLET | Freq: Every day | ORAL | Status: DC

## 2012-02-22 NOTE — Telephone Encounter (Signed)
rx sent to pharmacy by e-script  

## 2012-03-08 ENCOUNTER — Ambulatory Visit (INDEPENDENT_AMBULATORY_CARE_PROVIDER_SITE_OTHER): Payer: Medicare PPO | Admitting: *Deleted

## 2012-03-08 DIAGNOSIS — I82409 Acute embolism and thrombosis of unspecified deep veins of unspecified lower extremity: Secondary | ICD-10-CM

## 2012-03-08 DIAGNOSIS — Z7901 Long term (current) use of anticoagulants: Secondary | ICD-10-CM

## 2012-03-08 LAB — POCT INR: INR: 2.7

## 2012-04-20 ENCOUNTER — Ambulatory Visit (INDEPENDENT_AMBULATORY_CARE_PROVIDER_SITE_OTHER): Payer: Medicare PPO | Admitting: *Deleted

## 2012-04-20 DIAGNOSIS — I82409 Acute embolism and thrombosis of unspecified deep veins of unspecified lower extremity: Secondary | ICD-10-CM

## 2012-04-20 DIAGNOSIS — Z7901 Long term (current) use of anticoagulants: Secondary | ICD-10-CM

## 2012-04-20 LAB — POCT INR: INR: 3.1

## 2012-05-10 ENCOUNTER — Encounter: Payer: Self-pay | Admitting: Cardiology

## 2012-05-10 ENCOUNTER — Ambulatory Visit (INDEPENDENT_AMBULATORY_CARE_PROVIDER_SITE_OTHER): Payer: Medicare PPO | Admitting: Cardiology

## 2012-05-10 VITALS — BP 124/68 | HR 65 | Ht 62.5 in | Wt 140.8 lb

## 2012-05-10 DIAGNOSIS — I10 Essential (primary) hypertension: Secondary | ICD-10-CM

## 2012-05-10 DIAGNOSIS — E785 Hyperlipidemia, unspecified: Secondary | ICD-10-CM

## 2012-05-10 DIAGNOSIS — I2510 Atherosclerotic heart disease of native coronary artery without angina pectoris: Secondary | ICD-10-CM

## 2012-05-10 NOTE — Progress Notes (Signed)
   Clinical Summary Ms. Pinegar is a 72 y.o.female last seen in October 2013. She continues to do well, no angina symptoms or progressive shortness of breath. Has been less active over the winter, just starting to get back outside planting flowers.  Exercise Myoview from March 2013 demonstrated no diagnostic ST segment changes, no perfusion evidence of scar or ischemia, LVEF 65%.  ECG today shows sinus rhythm with low voltage. Labwork from December 2013 showed cholesterol 141, TG 72, HDL 34, and LDL 93. She reports compliance with her medications.   Allergies  Allergen Reactions  . Aspirin   . Niacin   . Ramipril   . Simvastatin     Current Outpatient Prescriptions  Medication Sig Dispense Refill  . aspirin 81 MG tablet Take 81 mg by mouth daily.        . calcium gluconate 500 MG tablet Take 500 mg by mouth daily.        Marland Kitchen ezetimibe (ZETIA) 10 MG tablet Take 1 tablet (10 mg total) by mouth daily.  90 tablet  0  . losartan (COZAAR) 50 MG tablet Take 1 tablet (50 mg total) by mouth daily.  90 tablet  0  . metoprolol succinate (TOPROL XL) 50 MG 24 hr tablet Take 1 1/2 tablets daily  135 tablet  2  . nitroGLYCERIN (NITROSTAT) 0.4 MG SL tablet Place 0.4 mg under the tongue every 5 (five) minutes as needed.        . pravastatin (PRAVACHOL) 20 MG tablet Take 1 tablet (20 mg total) by mouth daily.  90 tablet  0  . warfarin (COUMADIN) 4 MG tablet Take 1 tablet (4 mg total) by mouth as directed.  90 tablet  0   No current facility-administered medications for this visit.    Past Medical History  Diagnosis Date  . Coronary atherosclerosis of native coronary artery     BMS LAD 2002  . Hyperlipidemia   . Essential hypertension, benign   . Aortic stenosis   . Pericardial cyst     Status post excision 2002  . Pulmonary embolism   . DVT (deep venous thrombosis)     Social History Ms. Theriault reports that she has never smoked. She has never used smokeless tobacco. Ms. Minney reports that she  does not drink alcohol.  Review of Systems No palpitations or syncope. No bleeding problems. Stable appetite. Otherwise negative.  Physical Examination Filed Vitals:   05/10/12 1250  BP: 124/68  Pulse: 65   Filed Weights   05/10/12 1250  Weight: 140 lb 12.8 oz (63.866 kg)    Patient is comfortable and in no acute distress.  HEENT: Conjunctiva and lids are normal, oropharynx clear.  Neck: Supple no elevated jugular venous pressure or carotid bruits.  Lungs: Clear without labored breathing at rest.  Cardiac: Regular rate and rhythm with 2/6 systolic murmur at the base, preserved second heart sound. No S3 gallop or pericardial rub.  Abdomen: Soft and nontender. No hepatomegaly or bruits.  Extremities: No pitting edema. Distal pulses are 2+. Mild venous stasis.    Problem List and Plan   CORONARY ATHEROSCLEROSIS NATIVE CORONARY ARTERY Symptomatically stable on medical therapy. ECG reviewed as well as ischemic testing from last year. Continue observation for now.  HYPERTENSION Blood pressure control is good today, no changes made.  DYSLIPIDEMIA Recent LDL under 100, on statin therapy.    Jonelle Sidle, M.D., F.A.C.C.

## 2012-05-10 NOTE — Patient Instructions (Signed)
Your physician recommends that you schedule a follow-up appointment in: 6 months  

## 2012-05-10 NOTE — Assessment & Plan Note (Signed)
Recent LDL under 100, on statin therapy.

## 2012-05-10 NOTE — Assessment & Plan Note (Signed)
Symptomatically stable on medical therapy. ECG reviewed as well as ischemic testing from last year. Continue observation for now.

## 2012-05-10 NOTE — Assessment & Plan Note (Signed)
Blood pressure control is good today, no changes made. 

## 2012-05-12 ENCOUNTER — Encounter: Payer: Self-pay | Admitting: Cardiology

## 2012-05-25 ENCOUNTER — Other Ambulatory Visit: Payer: Self-pay | Admitting: Cardiology

## 2012-06-01 ENCOUNTER — Ambulatory Visit (INDEPENDENT_AMBULATORY_CARE_PROVIDER_SITE_OTHER): Payer: Medicare PPO | Admitting: *Deleted

## 2012-06-01 DIAGNOSIS — Z7901 Long term (current) use of anticoagulants: Secondary | ICD-10-CM

## 2012-06-01 DIAGNOSIS — I82409 Acute embolism and thrombosis of unspecified deep veins of unspecified lower extremity: Secondary | ICD-10-CM

## 2012-07-13 ENCOUNTER — Ambulatory Visit (INDEPENDENT_AMBULATORY_CARE_PROVIDER_SITE_OTHER): Payer: Medicare PPO | Admitting: *Deleted

## 2012-07-13 DIAGNOSIS — Z7901 Long term (current) use of anticoagulants: Secondary | ICD-10-CM

## 2012-07-13 DIAGNOSIS — I82409 Acute embolism and thrombosis of unspecified deep veins of unspecified lower extremity: Secondary | ICD-10-CM

## 2012-07-13 MED ORDER — METOPROLOL SUCCINATE ER 50 MG PO TB24: ORAL_TABLET | ORAL | Status: DC

## 2012-07-13 MED ORDER — EZETIMIBE 10 MG PO TABS: 10.0000 mg | ORAL_TABLET | Freq: Every day | ORAL | Status: DC

## 2012-07-13 MED ORDER — PRAVASTATIN SODIUM 20 MG PO TABS: 20.0000 mg | ORAL_TABLET | Freq: Every day | ORAL | Status: DC

## 2012-07-13 MED ORDER — LOSARTAN POTASSIUM 50 MG PO TABS: 50.0000 mg | ORAL_TABLET | Freq: Every day | ORAL | Status: DC

## 2012-07-13 MED ORDER — WARFARIN SODIUM 4 MG PO TABS: 4.0000 mg | ORAL_TABLET | ORAL | Status: DC

## 2012-07-13 MED ORDER — NITROGLYCERIN 0.4 MG SL SUBL: 0.4000 mg | SUBLINGUAL_TABLET | SUBLINGUAL | Status: DC | PRN

## 2012-07-18 ENCOUNTER — Other Ambulatory Visit: Payer: Self-pay | Admitting: *Deleted

## 2012-07-18 MED ORDER — NITROGLYCERIN 0.4 MG SL SUBL: 0.4000 mg | SUBLINGUAL_TABLET | SUBLINGUAL | Status: DC | PRN

## 2012-07-18 NOTE — Telephone Encounter (Signed)
Medication sent via escribe for Nitrostat. 

## 2012-07-19 ENCOUNTER — Other Ambulatory Visit: Payer: Self-pay | Admitting: *Deleted

## 2012-07-19 MED ORDER — WARFARIN SODIUM 4 MG PO TABS: 4.0000 mg | ORAL_TABLET | ORAL | Status: DC

## 2012-07-19 NOTE — Telephone Encounter (Signed)
Medication sent via escribe FOR WARFARIN

## 2012-08-24 ENCOUNTER — Ambulatory Visit (INDEPENDENT_AMBULATORY_CARE_PROVIDER_SITE_OTHER): Payer: Medicare PPO | Admitting: *Deleted

## 2012-08-24 DIAGNOSIS — Z7901 Long term (current) use of anticoagulants: Secondary | ICD-10-CM

## 2012-08-24 DIAGNOSIS — I82409 Acute embolism and thrombosis of unspecified deep veins of unspecified lower extremity: Secondary | ICD-10-CM

## 2012-08-24 LAB — POCT INR: INR: 2.4

## 2012-10-05 ENCOUNTER — Ambulatory Visit (INDEPENDENT_AMBULATORY_CARE_PROVIDER_SITE_OTHER): Payer: Medicare PPO | Admitting: *Deleted

## 2012-10-05 DIAGNOSIS — Z7901 Long term (current) use of anticoagulants: Secondary | ICD-10-CM

## 2012-10-05 DIAGNOSIS — I82409 Acute embolism and thrombosis of unspecified deep veins of unspecified lower extremity: Secondary | ICD-10-CM

## 2012-11-16 ENCOUNTER — Ambulatory Visit (INDEPENDENT_AMBULATORY_CARE_PROVIDER_SITE_OTHER): Payer: Medicare PPO | Admitting: *Deleted

## 2012-11-16 DIAGNOSIS — I82409 Acute embolism and thrombosis of unspecified deep veins of unspecified lower extremity: Secondary | ICD-10-CM

## 2012-11-16 DIAGNOSIS — Z7901 Long term (current) use of anticoagulants: Secondary | ICD-10-CM

## 2012-11-16 LAB — POCT INR: INR: 3.6

## 2012-12-13 ENCOUNTER — Encounter: Payer: Self-pay | Admitting: Cardiology

## 2012-12-13 ENCOUNTER — Ambulatory Visit (INDEPENDENT_AMBULATORY_CARE_PROVIDER_SITE_OTHER): Payer: Medicare PPO | Admitting: *Deleted

## 2012-12-13 ENCOUNTER — Ambulatory Visit (INDEPENDENT_AMBULATORY_CARE_PROVIDER_SITE_OTHER): Payer: Medicare PPO | Admitting: Cardiology

## 2012-12-13 VITALS — BP 160/70 | HR 74 | Ht 63.0 in | Wt 144.0 lb

## 2012-12-13 DIAGNOSIS — I10 Essential (primary) hypertension: Secondary | ICD-10-CM

## 2012-12-13 DIAGNOSIS — I359 Nonrheumatic aortic valve disorder, unspecified: Secondary | ICD-10-CM

## 2012-12-13 DIAGNOSIS — Z7901 Long term (current) use of anticoagulants: Secondary | ICD-10-CM

## 2012-12-13 DIAGNOSIS — E785 Hyperlipidemia, unspecified: Secondary | ICD-10-CM

## 2012-12-13 DIAGNOSIS — I82409 Acute embolism and thrombosis of unspecified deep veins of unspecified lower extremity: Secondary | ICD-10-CM

## 2012-12-13 DIAGNOSIS — I2510 Atherosclerotic heart disease of native coronary artery without angina pectoris: Secondary | ICD-10-CM

## 2012-12-13 DIAGNOSIS — I350 Nonrheumatic aortic (valve) stenosis: Secondary | ICD-10-CM

## 2012-12-13 LAB — POCT INR: INR: 3.5

## 2012-12-13 NOTE — Patient Instructions (Signed)
Your physician recommends that you schedule a follow-up appointment in: 6 months with Dr Randa Spike will receive a reminder letter two months in advance reminding you to call and schedule your appointment. If you don't receive this letter, please contact our office.  Your physician has requested that you have an echocardiogram. Echocardiography is a painless test that uses sound waves to create images of your heart. It provides your doctor with information about the size and shape of your heart and how well your heart's chambers and valves are working. This procedure takes approximately one hour. There are no restrictions for this procedure.  Your physician recommends that you continue on your current medications as directed. Please refer to the Current Medication list given to you today.

## 2012-12-13 NOTE — Assessment & Plan Note (Signed)
Aortic stenosis, mild by last assessment in 2010. Followup echocardiogram will be arranged.

## 2012-12-13 NOTE — Progress Notes (Signed)
Clinical Summary Danielle Murphy is a 72 y.o.female last seen in April. She has been doing well, no active angina symptoms. She reports compliance with her medications. She will be seeing Dr. Renard Matter next month for followup lipid panel. She may try and come off of Zetia.  Exercise Myoview from March 2013 demonstrated no diagnostic ST segment changes, no perfusion evidence of scar or ischemia, LVEF 65%. Echocardiogram from July 2010 revealed mild LVH with LVEF 55%, mild aortic stenosis, mild mitral regurgitation, mild to moderate left atrial enlargement, PASP 40 mmHg.  We discussed her medications. She has had no bleeding problems on Coumadin.   Allergies  Allergen Reactions  . Aspirin   . Niacin   . Ramipril   . Simvastatin     Current Outpatient Prescriptions  Medication Sig Dispense Refill  . aspirin 81 MG tablet Take 81 mg by mouth daily.        . calcium gluconate 500 MG tablet Take 500 mg by mouth daily.        Marland Kitchen ezetimibe (ZETIA) 10 MG tablet Take 1 tablet (10 mg total) by mouth daily.  90 tablet  3  . losartan (COZAAR) 50 MG tablet Take 1 tablet (50 mg total) by mouth daily.  90 tablet  3  . metoprolol succinate (TOPROL XL) 50 MG 24 hr tablet Take 1 1/2 tablets daily  135 tablet  3  . nitroGLYCERIN (NITROSTAT) 0.4 MG SL tablet Place 1 tablet (0.4 mg total) under the tongue every 5 (five) minutes as needed.  25 tablet  4  . pravastatin (PRAVACHOL) 20 MG tablet Take 1 tablet (20 mg total) by mouth daily.  90 tablet  3  . warfarin (COUMADIN) 4 MG tablet Take 1 tablet (4 mg total) by mouth as directed.  90 tablet  3   No current facility-administered medications for this visit.    Past Medical History  Diagnosis Date  . Coronary atherosclerosis of native coronary artery     BMS LAD 2002  . Hyperlipidemia   . Essential hypertension, benign   . Aortic stenosis   . Pericardial cyst     Status post excision 2002  . Pulmonary embolism   . DVT (deep venous thrombosis)     Past  Surgical History  Procedure Laterality Date  . Pericardial cyst excision  2002    Social History Ms. Frees reports that she has never smoked. She has never used smokeless tobacco. Ms. Cirelli reports that she does not drink alcohol.  Review of Systems No palpitations, dizziness, or syncope. Stable appetite. No claudication. Otherwise negative.  Physical Examination Filed Vitals:   12/13/12 0926  BP: 160/70  Pulse: 74   Filed Weights   12/13/12 0926  Weight: 144 lb (65.318 kg)    Patient is comfortable and in no acute distress.  HEENT: Conjunctiva and lids are normal, oropharynx clear.  Neck: Supple no elevated jugular venous pressure or carotid bruits.  Lungs: Clear without labored breathing at rest.  Cardiac: Regular rate and rhythm with 2-3/6 systolic murmur at the base, preserved second heart sound. No S3 gallop or pericardial rub.  Abdomen: Soft and nontender. No hepatomegaly or bruits.  Extremities: No pitting edema. Distal pulses are 2+. Mild venous stasis. Skin: Warm and dry. Musko skeletal: No kyphosis. Neuropsychiatric: Alert and oriented x3, affect appropriate.    Problem List and Plan   CORONARY ATHEROSCLEROSIS NATIVE CORONARY ARTERY Symptomatically stable status post BMS to LAD in 2002, negative followup Cardiolite last year.  Plan is to continue medical therapy and observation.  DYSLIPIDEMIA Continues on Pravachol, may try and come off of Zetia depending on lipid panel with Dr. Renard Matter in December.  Aortic valve disorders Aortic stenosis, mild by last assessment in 2010. Followup echocardiogram will be arranged.  HYPERTENSION Blood pressure elevated today. Keep pending followup with Dr. Renard Matter. She tells me that blood pressure is usually much better when she checks it at home.    Jonelle Sidle, M.D., F.A.C.C.

## 2012-12-13 NOTE — Assessment & Plan Note (Signed)
Symptomatically stable status post BMS to LAD in 2002, negative followup Cardiolite last year. Plan is to continue medical therapy and observation.

## 2012-12-13 NOTE — Assessment & Plan Note (Signed)
Blood pressure elevated today. Keep pending followup with Dr. Renard Matter. She tells me that blood pressure is usually much better when she checks it at home.

## 2012-12-13 NOTE — Assessment & Plan Note (Signed)
Continues on Pravachol, may try and come off of Zetia depending on lipid panel with Dr. Renard Matter in December.

## 2012-12-14 ENCOUNTER — Ambulatory Visit: Payer: Medicare PPO | Admitting: Cardiology

## 2013-01-11 ENCOUNTER — Ambulatory Visit (INDEPENDENT_AMBULATORY_CARE_PROVIDER_SITE_OTHER): Payer: Medicare PPO | Admitting: *Deleted

## 2013-01-11 ENCOUNTER — Ambulatory Visit (HOSPITAL_COMMUNITY)
Admission: RE | Admit: 2013-01-11 | Discharge: 2013-01-11 | Disposition: A | Discharge status: Home / Self Care | Payer: Medicare PPO | Source: Ambulatory Visit | Attending: Cardiology | Admitting: Cardiology

## 2013-01-11 DIAGNOSIS — I350 Nonrheumatic aortic (valve) stenosis: Secondary | ICD-10-CM

## 2013-01-11 DIAGNOSIS — Z86711 Personal history of pulmonary embolism: Secondary | ICD-10-CM | POA: Insufficient documentation

## 2013-01-11 DIAGNOSIS — I10 Essential (primary) hypertension: Secondary | ICD-10-CM | POA: Insufficient documentation

## 2013-01-11 DIAGNOSIS — I82409 Acute embolism and thrombosis of unspecified deep veins of unspecified lower extremity: Secondary | ICD-10-CM

## 2013-01-11 DIAGNOSIS — I359 Nonrheumatic aortic valve disorder, unspecified: Secondary | ICD-10-CM | POA: Insufficient documentation

## 2013-01-11 DIAGNOSIS — Z7901 Long term (current) use of anticoagulants: Secondary | ICD-10-CM

## 2013-01-11 DIAGNOSIS — I2510 Atherosclerotic heart disease of native coronary artery without angina pectoris: Secondary | ICD-10-CM | POA: Insufficient documentation

## 2013-01-11 DIAGNOSIS — I059 Rheumatic mitral valve disease, unspecified: Secondary | ICD-10-CM

## 2013-01-11 NOTE — Progress Notes (Signed)
*  PRELIMINARY RESULTS* Echocardiogram 2D Echocardiogram has been performed.  ,  01/11/2013, 11:46 AM

## 2013-01-12 ENCOUNTER — Other Ambulatory Visit (HOSPITAL_COMMUNITY): Payer: Self-pay | Admitting: Family Medicine

## 2013-01-12 DIAGNOSIS — Z139 Encounter for screening, unspecified: Secondary | ICD-10-CM

## 2013-01-23 ENCOUNTER — Ambulatory Visit (HOSPITAL_COMMUNITY)
Admission: RE | Admit: 2013-01-23 | Discharge: 2013-01-23 | Disposition: A | Discharge status: Home / Self Care | Payer: Medicare PPO | Source: Ambulatory Visit | Attending: Family Medicine | Admitting: Family Medicine

## 2013-01-23 ENCOUNTER — Other Ambulatory Visit (HOSPITAL_COMMUNITY): Payer: Self-pay | Admitting: Family Medicine

## 2013-01-23 DIAGNOSIS — D491 Neoplasm of unspecified behavior of respiratory system: Secondary | ICD-10-CM

## 2013-01-23 DIAGNOSIS — Z1231 Encounter for screening mammogram for malignant neoplasm of breast: Secondary | ICD-10-CM | POA: Insufficient documentation

## 2013-01-23 DIAGNOSIS — Z139 Encounter for screening, unspecified: Secondary | ICD-10-CM

## 2013-02-08 ENCOUNTER — Ambulatory Visit (INDEPENDENT_AMBULATORY_CARE_PROVIDER_SITE_OTHER): Payer: Medicare PPO | Admitting: *Deleted

## 2013-02-08 DIAGNOSIS — Z7901 Long term (current) use of anticoagulants: Secondary | ICD-10-CM

## 2013-02-08 DIAGNOSIS — I82409 Acute embolism and thrombosis of unspecified deep veins of unspecified lower extremity: Secondary | ICD-10-CM

## 2013-02-08 LAB — POCT INR: INR: 2.3

## 2013-03-08 ENCOUNTER — Ambulatory Visit (INDEPENDENT_AMBULATORY_CARE_PROVIDER_SITE_OTHER): Payer: Medicare PPO | Admitting: *Deleted

## 2013-03-08 DIAGNOSIS — Z5181 Encounter for therapeutic drug level monitoring: Secondary | ICD-10-CM | POA: Insufficient documentation

## 2013-03-08 DIAGNOSIS — Z7901 Long term (current) use of anticoagulants: Secondary | ICD-10-CM

## 2013-03-08 DIAGNOSIS — I82409 Acute embolism and thrombosis of unspecified deep veins of unspecified lower extremity: Secondary | ICD-10-CM

## 2013-03-08 LAB — POCT INR: INR: 2.4

## 2013-04-19 ENCOUNTER — Ambulatory Visit (INDEPENDENT_AMBULATORY_CARE_PROVIDER_SITE_OTHER): Payer: Medicare PPO | Admitting: *Deleted

## 2013-04-19 DIAGNOSIS — Z7901 Long term (current) use of anticoagulants: Secondary | ICD-10-CM

## 2013-04-19 DIAGNOSIS — I82409 Acute embolism and thrombosis of unspecified deep veins of unspecified lower extremity: Secondary | ICD-10-CM

## 2013-04-19 DIAGNOSIS — Z5181 Encounter for therapeutic drug level monitoring: Secondary | ICD-10-CM

## 2013-04-19 LAB — POCT INR: INR: 2.3

## 2013-04-19 MED ORDER — PRAVASTATIN SODIUM 20 MG PO TABS: 20.0000 mg | ORAL_TABLET | Freq: Every day | ORAL | Status: DC

## 2013-04-19 MED ORDER — WARFARIN SODIUM 4 MG PO TABS
4.0000 mg | ORAL_TABLET | ORAL | Status: DC
Start: 2013-04-19 — End: 2013-08-23

## 2013-04-19 MED ORDER — METOPROLOL SUCCINATE ER 50 MG PO TB24: ORAL_TABLET | ORAL | Status: DC

## 2013-04-19 MED ORDER — LOSARTAN POTASSIUM 50 MG PO TABS: 50.0000 mg | ORAL_TABLET | Freq: Every day | ORAL | Status: DC

## 2013-04-27 ENCOUNTER — Ambulatory Visit (HOSPITAL_COMMUNITY)
Admission: RE | Admit: 2013-04-27 | Discharge: 2013-04-27 | Disposition: A | Discharge status: Home / Self Care | Payer: Medicare PPO | Source: Ambulatory Visit | Attending: Family Medicine | Admitting: Family Medicine

## 2013-04-27 ENCOUNTER — Other Ambulatory Visit (HOSPITAL_COMMUNITY): Payer: Self-pay | Admitting: Family Medicine

## 2013-04-27 DIAGNOSIS — W19XXXA Unspecified fall, initial encounter: Secondary | ICD-10-CM

## 2013-04-27 DIAGNOSIS — M546 Pain in thoracic spine: Secondary | ICD-10-CM | POA: Insufficient documentation

## 2013-04-27 DIAGNOSIS — R079 Chest pain, unspecified: Secondary | ICD-10-CM | POA: Insufficient documentation

## 2013-04-27 DIAGNOSIS — M8448XA Pathological fracture, other site, initial encounter for fracture: Secondary | ICD-10-CM | POA: Insufficient documentation

## 2013-04-27 DIAGNOSIS — T07XXXA Unspecified multiple injuries, initial encounter: Secondary | ICD-10-CM | POA: Insufficient documentation

## 2013-05-31 ENCOUNTER — Ambulatory Visit (INDEPENDENT_AMBULATORY_CARE_PROVIDER_SITE_OTHER): Payer: Medicare PPO | Admitting: *Deleted

## 2013-05-31 DIAGNOSIS — Z7901 Long term (current) use of anticoagulants: Secondary | ICD-10-CM

## 2013-05-31 DIAGNOSIS — Z5181 Encounter for therapeutic drug level monitoring: Secondary | ICD-10-CM

## 2013-05-31 DIAGNOSIS — I82409 Acute embolism and thrombosis of unspecified deep veins of unspecified lower extremity: Secondary | ICD-10-CM

## 2013-05-31 LAB — POCT INR: INR: 2.2

## 2013-07-12 ENCOUNTER — Ambulatory Visit (INDEPENDENT_AMBULATORY_CARE_PROVIDER_SITE_OTHER): Payer: Medicare PPO | Admitting: *Deleted

## 2013-07-12 DIAGNOSIS — I82409 Acute embolism and thrombosis of unspecified deep veins of unspecified lower extremity: Secondary | ICD-10-CM

## 2013-07-12 DIAGNOSIS — Z7901 Long term (current) use of anticoagulants: Secondary | ICD-10-CM

## 2013-07-12 DIAGNOSIS — Z5181 Encounter for therapeutic drug level monitoring: Secondary | ICD-10-CM

## 2013-07-12 LAB — POCT INR: INR: 2.4

## 2013-07-31 ENCOUNTER — Ambulatory Visit (INDEPENDENT_AMBULATORY_CARE_PROVIDER_SITE_OTHER): Payer: Medicare PPO | Admitting: Cardiology

## 2013-07-31 ENCOUNTER — Encounter: Payer: Self-pay | Admitting: Cardiology

## 2013-07-31 VITALS — BP 152/82 | HR 51 | Ht 63.0 in | Wt 137.0 lb

## 2013-07-31 DIAGNOSIS — I359 Nonrheumatic aortic valve disorder, unspecified: Secondary | ICD-10-CM

## 2013-07-31 DIAGNOSIS — I2699 Other pulmonary embolism without acute cor pulmonale: Secondary | ICD-10-CM

## 2013-07-31 DIAGNOSIS — E785 Hyperlipidemia, unspecified: Secondary | ICD-10-CM

## 2013-07-31 DIAGNOSIS — I2510 Atherosclerotic heart disease of native coronary artery without angina pectoris: Secondary | ICD-10-CM

## 2013-07-31 DIAGNOSIS — I10 Essential (primary) hypertension: Secondary | ICD-10-CM

## 2013-07-31 NOTE — Progress Notes (Signed)
Clinical Summary Ms. Reif is a 73 y.o.female last seen in November 2014. She reports no regular angina symptoms or nitroglycerin use. She has retired since I saw her last, enjoys working outdoors in her garden. She reports compliance with her medications, is now off Zetia. She continues to follow with Dr. Everette Rank.  Exercise Myoview from March 2013 demonstrated no diagnostic ST segment changes, no perfusion evidence of scar or ischemia, LVEF 65%. Echocardiogram from December 2014 showed LVEF 60-65% with grade 2 diastolic dysfunction, mild aortic stenosis with mean gradient 9 mm mercury and planimetry valve area 1.45 cm, mild to moderate mitral regurgitation, mild to moderate left atrial enlargement, RVSP 36 mm mercury.  ECG today shows sinus rhythm with prolonged PR interval.   Allergies  Allergen Reactions  . Aspirin   . Niacin   . Ramipril   . Simvastatin     Current Outpatient Prescriptions  Medication Sig Dispense Refill  . aspirin 81 MG tablet Take 81 mg by mouth daily.        . calcium gluconate 500 MG tablet Take 500 mg by mouth daily.        Marland Kitchen losartan (COZAAR) 50 MG tablet Take 1 tablet (50 mg total) by mouth daily.  90 tablet  3  . metoprolol succinate (TOPROL XL) 50 MG 24 hr tablet Take 1 1/2 tablets daily  135 tablet  3  . nitroGLYCERIN (NITROSTAT) 0.4 MG SL tablet Place 1 tablet (0.4 mg total) under the tongue every 5 (five) minutes as needed.  25 tablet  4  . pravastatin (PRAVACHOL) 20 MG tablet Take 1 tablet (20 mg total) by mouth daily.  90 tablet  3  . warfarin (COUMADIN) 4 MG tablet Take 1 tablet (4 mg total) by mouth as directed.  90 tablet  3   No current facility-administered medications for this visit.    Past Medical History  Diagnosis Date  . Coronary atherosclerosis of native coronary artery     BMS LAD 2002  . Hyperlipidemia   . Essential hypertension, benign   . Aortic stenosis   . Pericardial cyst     Status post excision 2002  . Pulmonary  embolism   . DVT (deep venous thrombosis)     Social History Ms. Mcgrory reports that she has never smoked. She has never used smokeless tobacco. Ms. Canton reports that she does not drink alcohol.  Review of Systems No palpitations, dizziness, syncope, bleeding problems. Other systems reviewed and negative.  Physical Examination Filed Vitals:   07/31/13 1139  BP: 152/82  Pulse: 51   Filed Weights   07/31/13 1139  Weight: 137 lb (62.143 kg)    Patient appears comfortable.  HEENT: Conjunctiva and lids are normal, oropharynx clear.  Neck: Supple no elevated jugular venous pressure or carotid bruits.  Lungs: Clear without labored breathing at rest.  Cardiac: Regular rate and rhythm with 8-8/4 systolic murmur at the base, preserved second heart sound. No S3 gallop or pericardial rub.  Abdomen: Soft and nontender. No hepatomegaly or bruits.  Extremities: No pitting edema. Distal pulses are 2+. Mild venous stasis.  Skin: Warm and dry.  Musko skeletal: No kyphosis.  Neuropsychiatric: Alert and oriented x3, affect appropriate.   Problem List and Plan   CORONARY ATHEROSCLEROSIS NATIVE CORONARY ARTERY Symptomatically stable on medical therapy. ECG reviewed. Continue regular activity and observation.  Aortic valve disorders Mild aortic stenosis, followup echocardiogram from December 2014 reviewed.  DYSLIPIDEMIA She continues on Pravachol, has stopped Zetia. Keep  followup with Dr. Everette Rank.  Pulmonary embolus She continues on Coumadin, no bleeding problems.    Satira Sark, M.D., F.A.C.C.

## 2013-07-31 NOTE — Patient Instructions (Signed)
Your physician wants you to follow-up in: 6 months You will receive a reminder letter in the mail two months in advance. If you don't receive a letter, please call our office to schedule the follow-up appointment.     Your physician recommends that you continue on your current medications as directed. Please refer to the Current Medication list given to you today.      Thank you for choosing [facility] !        

## 2013-07-31 NOTE — Assessment & Plan Note (Signed)
Symptomatically stable on medical therapy. ECG reviewed. Continue regular activity and observation.

## 2013-07-31 NOTE — Assessment & Plan Note (Signed)
Mild aortic stenosis, followup echocardiogram from December 2014 reviewed.

## 2013-07-31 NOTE — Assessment & Plan Note (Signed)
She continues on Coumadin, no bleeding problems.

## 2013-07-31 NOTE — Assessment & Plan Note (Signed)
She continues on Pravachol, has stopped Zetia. Keep followup with Dr. Everette Rank.

## 2013-08-23 ENCOUNTER — Ambulatory Visit (INDEPENDENT_AMBULATORY_CARE_PROVIDER_SITE_OTHER): Payer: Medicare PPO | Admitting: *Deleted

## 2013-08-23 DIAGNOSIS — Z7901 Long term (current) use of anticoagulants: Secondary | ICD-10-CM

## 2013-08-23 DIAGNOSIS — Z5181 Encounter for therapeutic drug level monitoring: Secondary | ICD-10-CM

## 2013-08-23 DIAGNOSIS — I82409 Acute embolism and thrombosis of unspecified deep veins of unspecified lower extremity: Secondary | ICD-10-CM

## 2013-08-23 LAB — POCT INR: INR: 2.5

## 2013-08-23 MED ORDER — WARFARIN SODIUM 4 MG PO TABS: 4.0000 mg | ORAL_TABLET | ORAL | Status: DC

## 2013-08-23 MED ORDER — LOSARTAN POTASSIUM 50 MG PO TABS: 50.0000 mg | ORAL_TABLET | Freq: Every day | ORAL | Status: DC

## 2013-08-23 MED ORDER — METOPROLOL SUCCINATE ER 50 MG PO TB24: ORAL_TABLET | ORAL | Status: DC

## 2013-09-06 ENCOUNTER — Ambulatory Visit: Payer: Medicare PPO | Admitting: Cardiology

## 2013-10-04 ENCOUNTER — Ambulatory Visit (INDEPENDENT_AMBULATORY_CARE_PROVIDER_SITE_OTHER): Payer: Medicare PPO | Admitting: *Deleted

## 2013-10-04 DIAGNOSIS — Z5181 Encounter for therapeutic drug level monitoring: Secondary | ICD-10-CM

## 2013-10-04 DIAGNOSIS — Z7901 Long term (current) use of anticoagulants: Secondary | ICD-10-CM

## 2013-10-04 DIAGNOSIS — I82409 Acute embolism and thrombosis of unspecified deep veins of unspecified lower extremity: Secondary | ICD-10-CM

## 2013-10-04 LAB — POCT INR: INR: 2.9

## 2013-11-14 ENCOUNTER — Ambulatory Visit (INDEPENDENT_AMBULATORY_CARE_PROVIDER_SITE_OTHER): Payer: Medicare PPO | Admitting: *Deleted

## 2013-11-14 DIAGNOSIS — Z5181 Encounter for therapeutic drug level monitoring: Secondary | ICD-10-CM

## 2013-11-14 DIAGNOSIS — I82409 Acute embolism and thrombosis of unspecified deep veins of unspecified lower extremity: Secondary | ICD-10-CM

## 2013-11-14 DIAGNOSIS — Z7901 Long term (current) use of anticoagulants: Secondary | ICD-10-CM

## 2013-11-14 LAB — POCT INR: INR: 2.8

## 2013-12-26 ENCOUNTER — Ambulatory Visit (INDEPENDENT_AMBULATORY_CARE_PROVIDER_SITE_OTHER): Payer: Medicare PPO | Admitting: *Deleted

## 2013-12-26 DIAGNOSIS — I82409 Acute embolism and thrombosis of unspecified deep veins of unspecified lower extremity: Secondary | ICD-10-CM

## 2013-12-26 DIAGNOSIS — Z7901 Long term (current) use of anticoagulants: Secondary | ICD-10-CM

## 2013-12-26 DIAGNOSIS — Z5181 Encounter for therapeutic drug level monitoring: Secondary | ICD-10-CM

## 2013-12-26 LAB — POCT INR: INR: 2

## 2014-02-06 ENCOUNTER — Ambulatory Visit (INDEPENDENT_AMBULATORY_CARE_PROVIDER_SITE_OTHER): Payer: Medicare PPO | Admitting: *Deleted

## 2014-02-06 DIAGNOSIS — Z7901 Long term (current) use of anticoagulants: Secondary | ICD-10-CM

## 2014-02-06 DIAGNOSIS — I82409 Acute embolism and thrombosis of unspecified deep veins of unspecified lower extremity: Secondary | ICD-10-CM

## 2014-02-06 DIAGNOSIS — Z5181 Encounter for therapeutic drug level monitoring: Secondary | ICD-10-CM

## 2014-02-06 LAB — POCT INR: INR: 2.4

## 2014-03-20 ENCOUNTER — Ambulatory Visit (INDEPENDENT_AMBULATORY_CARE_PROVIDER_SITE_OTHER): Payer: Medicare PPO | Admitting: *Deleted

## 2014-03-20 DIAGNOSIS — I82409 Acute embolism and thrombosis of unspecified deep veins of unspecified lower extremity: Secondary | ICD-10-CM | POA: Diagnosis not present

## 2014-03-20 DIAGNOSIS — Z7901 Long term (current) use of anticoagulants: Secondary | ICD-10-CM

## 2014-03-20 DIAGNOSIS — Z5181 Encounter for therapeutic drug level monitoring: Secondary | ICD-10-CM

## 2014-03-20 LAB — POCT INR: INR: 2.5

## 2014-05-01 ENCOUNTER — Ambulatory Visit (INDEPENDENT_AMBULATORY_CARE_PROVIDER_SITE_OTHER): Payer: Medicare PPO | Admitting: *Deleted

## 2014-05-01 DIAGNOSIS — I82409 Acute embolism and thrombosis of unspecified deep veins of unspecified lower extremity: Secondary | ICD-10-CM

## 2014-05-01 DIAGNOSIS — Z5181 Encounter for therapeutic drug level monitoring: Secondary | ICD-10-CM | POA: Diagnosis not present

## 2014-05-01 DIAGNOSIS — Z7901 Long term (current) use of anticoagulants: Secondary | ICD-10-CM

## 2014-05-01 LAB — POCT INR: INR: 2.1

## 2014-06-12 ENCOUNTER — Ambulatory Visit (INDEPENDENT_AMBULATORY_CARE_PROVIDER_SITE_OTHER): Payer: Medicare PPO | Admitting: *Deleted

## 2014-06-12 DIAGNOSIS — Z5181 Encounter for therapeutic drug level monitoring: Secondary | ICD-10-CM

## 2014-06-12 DIAGNOSIS — Z7901 Long term (current) use of anticoagulants: Secondary | ICD-10-CM | POA: Diagnosis not present

## 2014-06-12 DIAGNOSIS — I82409 Acute embolism and thrombosis of unspecified deep veins of unspecified lower extremity: Secondary | ICD-10-CM | POA: Diagnosis not present

## 2014-06-12 LAB — POCT INR: INR: 2

## 2014-06-12 MED ORDER — WARFARIN SODIUM 4 MG PO TABS: 4.0000 mg | ORAL_TABLET | ORAL | Status: DC

## 2014-06-12 MED ORDER — LOSARTAN POTASSIUM 50 MG PO TABS
50.0000 mg | ORAL_TABLET | Freq: Every day | ORAL | Status: DC
Start: 2014-06-12 — End: 2015-06-16

## 2014-06-12 MED ORDER — PRAVASTATIN SODIUM 20 MG PO TABS: 20.0000 mg | ORAL_TABLET | Freq: Every day | ORAL | Status: DC

## 2014-06-12 MED ORDER — METOPROLOL SUCCINATE ER 50 MG PO TB24: ORAL_TABLET | ORAL | Status: DC

## 2014-07-17 ENCOUNTER — Encounter (HOSPITAL_COMMUNITY): Payer: Self-pay | Admitting: Emergency Medicine

## 2014-07-17 ENCOUNTER — Emergency Department (HOSPITAL_COMMUNITY): Payer: Medicare PPO

## 2014-07-17 ENCOUNTER — Emergency Department (HOSPITAL_COMMUNITY)
Admission: EM | Admit: 2014-07-17 | Discharge: 2014-07-17 | Disposition: A | Discharge status: Home / Self Care | Payer: Medicare PPO | Attending: Emergency Medicine | Admitting: Emergency Medicine

## 2014-07-17 DIAGNOSIS — E785 Hyperlipidemia, unspecified: Secondary | ICD-10-CM | POA: Insufficient documentation

## 2014-07-17 DIAGNOSIS — Z23 Encounter for immunization: Secondary | ICD-10-CM | POA: Insufficient documentation

## 2014-07-17 DIAGNOSIS — S01112A Laceration without foreign body of left eyelid and periocular area, initial encounter: Secondary | ICD-10-CM | POA: Diagnosis not present

## 2014-07-17 DIAGNOSIS — I2510 Atherosclerotic heart disease of native coronary artery without angina pectoris: Secondary | ICD-10-CM | POA: Diagnosis not present

## 2014-07-17 DIAGNOSIS — Y9389 Activity, other specified: Secondary | ICD-10-CM | POA: Diagnosis not present

## 2014-07-17 DIAGNOSIS — Z86718 Personal history of other venous thrombosis and embolism: Secondary | ICD-10-CM | POA: Diagnosis not present

## 2014-07-17 DIAGNOSIS — W14XXXA Fall from tree, initial encounter: Secondary | ICD-10-CM | POA: Diagnosis not present

## 2014-07-17 DIAGNOSIS — W19XXXA Unspecified fall, initial encounter: Secondary | ICD-10-CM

## 2014-07-17 DIAGNOSIS — I10 Essential (primary) hypertension: Secondary | ICD-10-CM | POA: Diagnosis not present

## 2014-07-17 DIAGNOSIS — Y9289 Other specified places as the place of occurrence of the external cause: Secondary | ICD-10-CM | POA: Insufficient documentation

## 2014-07-17 DIAGNOSIS — Y998 Other external cause status: Secondary | ICD-10-CM | POA: Diagnosis not present

## 2014-07-17 DIAGNOSIS — Z79899 Other long term (current) drug therapy: Secondary | ICD-10-CM | POA: Insufficient documentation

## 2014-07-17 DIAGNOSIS — Z86711 Personal history of pulmonary embolism: Secondary | ICD-10-CM | POA: Diagnosis not present

## 2014-07-17 DIAGNOSIS — Z7982 Long term (current) use of aspirin: Secondary | ICD-10-CM | POA: Diagnosis not present

## 2014-07-17 DIAGNOSIS — S0990XA Unspecified injury of head, initial encounter: Secondary | ICD-10-CM

## 2014-07-17 DIAGNOSIS — S79912A Unspecified injury of left hip, initial encounter: Secondary | ICD-10-CM | POA: Insufficient documentation

## 2014-07-17 DIAGNOSIS — Z7901 Long term (current) use of anticoagulants: Secondary | ICD-10-CM | POA: Insufficient documentation

## 2014-07-17 MED ORDER — LIDOCAINE HCL (PF) 1 % IJ SOLN
INTRAMUSCULAR | Status: AC
  Administered 2014-07-17: 21:00:00
  Filled 2014-07-17: qty 5

## 2014-07-17 MED ORDER — TETANUS-DIPHTH-ACELL PERTUSSIS 5-2.5-18.5 LF-MCG/0.5 IM SUSP
0.5000 mL | Freq: Once | INTRAMUSCULAR | Status: AC
  Administered 2014-07-17: 0.5 mL via INTRAMUSCULAR
  Filled 2014-07-17: qty 0.5

## 2014-07-17 MED ORDER — LIDOCAINE HCL (PF) 1 % IJ SOLN
INTRAMUSCULAR | Status: AC
  Filled 2014-07-17: qty 5

## 2014-07-17 NOTE — ED Provider Notes (Signed)
CSN: [id number]     Arrival date & time 07/17/14  1755 History   First MD Initiated Contact with Patient 07/17/14 1811     Chief Complaint  Patient presents with  . Head Injury     (Consider location/radiation/quality/duration/timing/severity/associated sxs/prior Treatment) HPI Comments:  Patient states she was hit in the head by a tree limb she was trying to cut down about 30 minutes ago. She has a hematoma and abrasion to her left forehead with small laceration. She is on Coumadin for history of PE. She did not lose consciousness but fell backwards onto her left hip. Denies losing consciousness. Denies any focal weakness, numbness or tingling. Denies any difficulty breathing. Denies any abdominal pain. No neck or back pain. She has some subxiphoid tenderness where the tree limb hit her in the chest.  The history is provided by the patient.    Past Medical History  Diagnosis Date  . Coronary atherosclerosis of native coronary artery     BMS LAD 2002  . Hyperlipidemia   . Essential hypertension, benign   . Aortic stenosis   . Pericardial cyst     Status post excision 2002  . Pulmonary embolism   . DVT (deep venous thrombosis)    Past Surgical History  Procedure Laterality Date  . Pericardial cyst excision  2002   History reviewed. No pertinent family history. History  Substance Use Topics  . Smoking status: Never Smoker   . Smokeless tobacco: Never Used  . Alcohol Use: No   OB History    No data available     Review of Systems  Constitutional: Negative for fever, activity change, appetite change and fatigue.  HENT: Negative for congestion and rhinorrhea.   Eyes: Negative for visual disturbance.  Respiratory: Negative for cough, chest tightness and shortness of breath.   Gastrointestinal: Negative for nausea, vomiting and abdominal pain.  Genitourinary: Negative for dysuria and hematuria.  Musculoskeletal: Positive for arthralgias. Negative for back pain, neck pain and  neck stiffness.  Skin: Negative for pallor.  Neurological: Positive for headaches. Negative for dizziness, weakness, light-headedness and numbness.  A complete 10 system review of systems was obtained and all systems are negative except as noted in the HPI and PMH.      Allergies  Aspirin; Niacin; Ramipril; and Simvastatin  Home Medications   Prior to Admission medications   Medication Sig Start Date End Date Taking? Authorizing Provider  aspirin 81 MG tablet Take 81 mg by mouth every morning.    Yes Historical Provider, MD  calcium gluconate 500 MG tablet Take 500 mg by mouth daily.     Yes Historical Provider, MD  losartan (COZAAR) 50 MG tablet Take 1 tablet (50 mg total) by mouth daily. 06/12/14  Yes Satira Sark, MD  metoprolol succinate (TOPROL XL) 50 MG 24 hr tablet Take 1 1/2 tablets daily Patient taking differently: Take 25-50 mg by mouth 2 (two) times daily. 25mg  in the morning and 50mg  at bedtime 06/12/14  Yes Satira Sark, MD  nitroGLYCERIN (NITROSTAT) 0.4 MG SL tablet Place 1 tablet (0.4 mg total) under the tongue every 5 (five) minutes as needed. 07/18/12  Yes Satira Sark, MD  pravastatin (PRAVACHOL) 20 MG tablet Take 1 tablet (20 mg total) by mouth daily. 06/12/14  Yes Satira Sark, MD  warfarin (COUMADIN) 4 MG tablet Take 1 tablet (4 mg total) by mouth as directed. Patient taking differently: Take 2-4 mg by mouth See admin instructions. 4mg  on Mondays  and Fridays. Take 2mg  on all other days 06/12/14  Yes Satira Sark, MD   BP 121/54 mmHg  Pulse 64  Temp(Src) 98.2 F (36.8 C) (Oral)  Resp 19  Ht 5\' 1"  (1.549 m)  Wt 129 lb (58.514 kg)  BMI 24.39 kg/m2  SpO2 95% Physical Exam  Constitutional: She is oriented to person, place, and time. She appears well-developed and well-nourished. No distress.  HENT:  Head: Normocephalic.  Mouth/Throat: Oropharynx is clear and moist. No oropharyngeal exudate.  Hematoma to L eyebrow with small laceration,  bleeding controlled.  EOMI  Eyes: Conjunctivae and EOM are normal. Pupils are equal, round, and reactive to light.  Neck: Normal range of motion. Neck supple.  No C spine tenderness  Cardiovascular: Normal rate, regular rhythm, normal heart sounds and intact distal pulses.   No murmur heard. Pulmonary/Chest: Effort normal and breath sounds normal. No respiratory distress. She exhibits no tenderness.  Abdominal: Soft. There is no tenderness. There is no rebound and no guarding.  Musculoskeletal: Normal range of motion. She exhibits tenderness. She exhibits no edema.  TTP L lateral hip. No T or L spine tenderness  Neurological: She is alert and oriented to person, place, and time. No cranial nerve deficit. She exhibits normal muscle tone. Coordination normal.  No ataxia on finger to nose bilaterally. No pronator drift. 5/5 strength throughout. CN 2-12 intact. Negative Romberg. Equal grip strength. Sensation intact. Gait is normal.   Skin: Skin is warm.  Psychiatric: She has a normal mood and affect. Her behavior is normal.  Nursing note and vitals reviewed.   ED Course  LACERATION REPAIR Date/Time: 07/17/2014 8:43 PM Performed by: Ezequiel Essex Authorized by: Ezequiel Essex Consent: Verbal consent obtained. Risks and benefits: risks, benefits and alternatives were discussed Consent given by: patient Patient understanding: patient states understanding of the procedure being performed Patient consent: the patient's understanding of the procedure matches consent given Procedure consent: procedure consent matches procedure scheduled Relevant documents: relevant documents present and verified Test results: test results available and properly labeled Site marked: the operative site was marked Imaging studies: imaging studies available Required items: required blood products, implants, devices, and special equipment available Patient identity confirmed: provided demographic data and  verbally with patient Time out: Immediately prior to procedure a "time out" was called to verify the correct patient, procedure, equipment, support staff and site/side marked as required. Body area: head/neck Location details: forehead Laceration length: 1 cm Tendon involvement: none Nerve involvement: none Vascular damage: no Anesthesia: local infiltration Local anesthetic: lidocaine 1% without epinephrine Anesthetic total: 3 ml Patient sedated: no Preparation: Patient was prepped and draped in the usual sterile fashion. Irrigation solution: tap water Irrigation method: syringe Amount of cleaning: standard Debridement: none Skin closure: 6-0 Prolene Number of sutures: 2 Technique: simple Approximation: close Approximation difficulty: simple Dressing: 4x4 sterile gauze and antibiotic ointment Patient tolerance: Patient tolerated the procedure well with no immediate complications   (including critical care time) Labs Review Labs Reviewed - No data to display  Imaging Review Dg Chest 2 View  07/17/2014   CLINICAL DATA:  Multiple trauma after the patient was struck in the head by a falling tree limb. The patient fell and landed on her left hip.  EXAM: CHEST  2 VIEW  COMPARISON:  01/23/2013  FINDINGS: Heart size and pulmonary vascularity are normal. No infiltrates or effusions. Multiple old bilateral rib fractures. Multiple old compression fractures in the mid thoracic spine.  Tortuosity and calcification of the thoracic aorta.  The lungs appear hyperinflated suggesting emphysema.  IMPRESSION: No acute abnormalities. Aortic atherosclerosis. Hyperinflated lungs suggesting emphysema.   Electronically Signed   By: Lorriane Shire M.D.   On: 07/17/2014 19:25   Dg Pelvis 1-2 Views  07/17/2014   CLINICAL DATA:  Left hip pain after the patient was struck in the head by a tree limb and fell along the left hip. Previous left femur fracture.  EXAM: PELVIS - 1-2 VIEW  COMPARISON:  None.  FINDINGS:  Intra medullary nail and fixation screw in the proximal left femur with deformity of the greater trochanter. Moderate arthritic changes of the left hip. Severe arthritic changes of the right hip. Pelvic bones are intact.  IMPRESSION: No acute abnormalities. Arthritic and old posttraumatic changes of the left hip.   Electronically Signed   By: Lorriane Shire M.D.   On: 07/17/2014 19:29   Ct Head Wo Contrast  07/17/2014   CLINICAL DATA:  Blunt head trauma to left temple by tree limb. Headache. Patient on Coumadin.  EXAM: CT HEAD WITHOUT CONTRAST  TECHNIQUE: Contiguous axial images were obtained from the base of the skull through the vertex without intravenous contrast.  COMPARISON:  None.  FINDINGS: There is no evidence of intracranial hemorrhage, brain edema, or other signs of acute infarction. There is no evidence of intracranial mass lesion or mass effect. No abnormal extraaxial fluid collections are identified.  Mild chronic small vessel disease is noted. No evidence of hydrocephalus. No evidence of skull fracture or pneumocephalus.  IMPRESSION: No acute intracranial abnormality.  Mild chronic small vessel disease.   Electronically Signed   By: Earle Gell M.D.   On: 07/17/2014 19:52   Dg Femur Min 2 Views Left  07/17/2014   ADDENDUM REPORT: 07/17/2014 19:49  ADDENDUM: Sentence in the impression should read: Evidence of old healed fracture of the mid left femur with remodeling and small exostoses, benign in appearance, in these areas.   Electronically Signed   By: Lowella Grip III M.D.   On: 07/17/2014 19:49   07/17/2014   CLINICAL DATA:  Pain following fall  EXAM: LEFT FEMUR 2 VIEWS  COMPARISON:  None.  FINDINGS: Frontal and lateral views were obtained. There is screw and rod fixation through a prior fracture of the midportion of the femur. There is been bony remodeling in this area. Small benign exostoses in this area are felt to posttraumatic etiology. There is no acute fracture or dislocation. No  knee joint effusion. There is mild narrowing of the left hip joint. Bones are osteoporotic.  IMPRESSION: Evidence of old healed fracture of the mid left clavicle with remodeling and small exostoses, benign in appearance, in these areas. No acute fracture or dislocation. 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 No knee joint effusion.  Electronically Signed: By: Lowella Grip III M.D. On: 07/17/2014 19:23     EKG Interpretation   Date/Time:  Wednesday July 17 2014 18:45:44 EDT Ventricular Rate:  64 PR Interval:  203 QRS Duration: 76 QT Interval:  403 QTC Calculation: 416 R Axis:   49 Text Interpretation:  Sinus rhythm Abnormal R-wave progression, early  transition No significant change was found Confirmed by Wyvonnia Dusky  MD,   [phone]) on 07/17/2014 7:03:16 PM      MDM   Final diagnoses:  Fall  Head injury, initial encounter    head injury on Coumadin with mechanical fall injuring left hip. No loss of consciousness. Neurologically intact.   Check CT head given Coumadin use. Left hip  x-ray   CT head negative. Hip x-ray negative. Patient able to ambulate. Tetanus updated.   Laceration repaired as above. Discussed risk of delayed bleeding on Coumadin with patient. She is tolerating by mouth and ambulatory. Her husband will watch her tonight.  Suture removal in 5-7 days.   Ezequiel Essex, MD 07/17/14 2050

## 2014-07-17 NOTE — Discharge Instructions (Signed)
Head Injury Follow up for suture removal in 5-7 days. Return to the ED with worsening headache, vomiting, behavior change or any other concerns. You have received a head injury. It does not appear serious at this time. Headaches and vomiting are common following head injury. It should be easy to awaken from sleeping. Sometimes it is necessary for you to stay in the emergency department for a while for observation. Sometimes admission to the hospital may be needed. After injuries such as yours, most problems occur within the first 24 hours, but side effects may occur up to 7-10 days after the injury. It is important for you to carefully monitor your condition and contact your health care provider or seek immediate medical care if there is a change in your condition. WHAT ARE THE TYPES OF HEAD INJURIES? Head injuries can be as minor as a bump. Some head injuries can be more severe. More severe head injuries include:  A jarring injury to the brain (concussion).  A bruise of the brain (contusion). This mean there is bleeding in the brain that can cause swelling.  A cracked skull (skull fracture).  Bleeding in the brain that collects, clots, and forms a bump (hematoma). WHAT CAUSES A HEAD INJURY? A serious head injury is most likely to happen to someone who is in a car wreck and is not wearing a seat belt. Other causes of major head injuries include bicycle or motorcycle accidents, sports injuries, and falls. HOW ARE HEAD INJURIES DIAGNOSED? A complete history of the event leading to the injury and your current symptoms will be helpful in diagnosing head injuries. Many times, pictures of the brain, such as CT or MRI are needed to see the extent of the injury. Often, an overnight hospital stay is necessary for observation.  WHEN SHOULD I SEEK IMMEDIATE MEDICAL CARE?  You should get help right away if:  You have confusion or drowsiness.  You feel sick to your stomach (nauseous) or have continued,  forceful vomiting.  You have dizziness or unsteadiness that is getting worse.  You have severe, continued headaches not relieved by medicine. Only take over-the-counter or prescription medicines for pain, fever, or discomfort as directed by your health care provider.  You do not have normal function of the arms or legs or are unable to walk.  You notice changes in the black spots in the center of the colored part of your eye (pupil).  You have a clear or bloody fluid coming from your nose or ears.  You have a loss of vision. During the next 24 hours after the injury, you must stay with someone who can watch you for the warning signs. This person should contact local emergency services (911 in the [place] if you have seizures, you become unconscious, or you are unable to wake up. HOW CAN I PREVENT A HEAD INJURY IN THE FUTURE? The most important factor for preventing major head injuries is avoiding motor vehicle accidents. To minimize the potential for damage to your head, it is crucial to wear seat belts while riding in motor vehicles. Wearing helmets while bike riding and playing collision sports (like football) is also helpful. Also, avoiding dangerous activities around the house will further help reduce your risk of head injury.  WHEN CAN I RETURN TO NORMAL ACTIVITIES AND ATHLETICS? You should be reevaluated by your health care provider before returning to these activities. If you have any of the following symptoms, you should not return to activities or contact sports until  1 week after the symptoms have stopped:  Persistent headache.  Dizziness or vertigo.  Poor attention and concentration.  Confusion.  Memory problems.  Nausea or vomiting.  Fatigue or tire easily.  Irritability.  Intolerant of bright lights or loud noises.  Anxiety or depression.  Disturbed sleep. MAKE SURE YOU:   Understand these instructions.  Will watch your condition.  Will get help right away if  you are not doing well or get worse. Document Released: 01/11/2005 Document Revised: 01/16/2013 Document Reviewed: 09/18/2012 [facility] Patient Information 2015 [place], [place]. This information is not intended to replace advice given to you by your health care provider. Make sure you discuss any questions you have with your health care provider.

## 2014-07-17 NOTE — ED Notes (Signed)
MD at bedside. 

## 2014-07-17 NOTE — ED Notes (Signed)
Pt ambulated by [place], NT. Pt ambulated with no problems.

## 2014-07-17 NOTE — ED Notes (Signed)
Pt states that she was hit in the head by a tree limb about 30 minutes ago.  States that she fell on the ground and is also c/o left hip pain.  Denies LOC or blurred vision.

## 2014-07-24 ENCOUNTER — Ambulatory Visit (INDEPENDENT_AMBULATORY_CARE_PROVIDER_SITE_OTHER): Payer: Medicare PPO | Admitting: *Deleted

## 2014-07-24 DIAGNOSIS — Z5181 Encounter for therapeutic drug level monitoring: Secondary | ICD-10-CM | POA: Diagnosis not present

## 2014-07-24 DIAGNOSIS — I82409 Acute embolism and thrombosis of unspecified deep veins of unspecified lower extremity: Secondary | ICD-10-CM

## 2014-07-24 DIAGNOSIS — Z7901 Long term (current) use of anticoagulants: Secondary | ICD-10-CM

## 2014-07-24 LAB — POCT INR: INR: 1.9

## 2014-08-21 ENCOUNTER — Ambulatory Visit (INDEPENDENT_AMBULATORY_CARE_PROVIDER_SITE_OTHER): Payer: Medicare PPO | Admitting: *Deleted

## 2014-08-21 DIAGNOSIS — Z7901 Long term (current) use of anticoagulants: Secondary | ICD-10-CM

## 2014-08-21 DIAGNOSIS — I82409 Acute embolism and thrombosis of unspecified deep veins of unspecified lower extremity: Secondary | ICD-10-CM | POA: Diagnosis not present

## 2014-08-21 DIAGNOSIS — Z5181 Encounter for therapeutic drug level monitoring: Secondary | ICD-10-CM

## 2014-08-21 LAB — POCT INR: INR: 2.2

## 2014-09-18 ENCOUNTER — Ambulatory Visit (INDEPENDENT_AMBULATORY_CARE_PROVIDER_SITE_OTHER): Payer: Medicare PPO | Admitting: *Deleted

## 2014-09-18 DIAGNOSIS — Z7901 Long term (current) use of anticoagulants: Secondary | ICD-10-CM

## 2014-09-18 DIAGNOSIS — Z5181 Encounter for therapeutic drug level monitoring: Secondary | ICD-10-CM | POA: Diagnosis not present

## 2014-09-18 DIAGNOSIS — I82409 Acute embolism and thrombosis of unspecified deep veins of unspecified lower extremity: Secondary | ICD-10-CM

## 2014-09-18 LAB — POCT INR: INR: 2

## 2014-09-24 ENCOUNTER — Ambulatory Visit (HOSPITAL_COMMUNITY)
Admission: RE | Admit: 2014-09-24 | Discharge: 2014-09-24 | Disposition: A | Discharge status: Home / Self Care | Payer: Medicare PPO | Source: Ambulatory Visit | Attending: Family Medicine | Admitting: Family Medicine

## 2014-09-24 ENCOUNTER — Other Ambulatory Visit (HOSPITAL_COMMUNITY): Payer: Self-pay | Admitting: Family Medicine

## 2014-09-24 DIAGNOSIS — Z86718 Personal history of other venous thrombosis and embolism: Secondary | ICD-10-CM | POA: Insufficient documentation

## 2014-09-24 DIAGNOSIS — I82402 Acute embolism and thrombosis of unspecified deep veins of left lower extremity: Secondary | ICD-10-CM

## 2014-09-24 DIAGNOSIS — M79605 Pain in left leg: Secondary | ICD-10-CM | POA: Insufficient documentation

## 2014-10-16 ENCOUNTER — Ambulatory Visit (INDEPENDENT_AMBULATORY_CARE_PROVIDER_SITE_OTHER): Payer: Medicare PPO | Admitting: *Deleted

## 2014-10-16 DIAGNOSIS — Z5181 Encounter for therapeutic drug level monitoring: Secondary | ICD-10-CM | POA: Diagnosis not present

## 2014-10-16 DIAGNOSIS — I82409 Acute embolism and thrombosis of unspecified deep veins of unspecified lower extremity: Secondary | ICD-10-CM

## 2014-10-16 DIAGNOSIS — Z7901 Long term (current) use of anticoagulants: Secondary | ICD-10-CM | POA: Diagnosis not present

## 2014-10-16 LAB — POCT INR: INR: 2.3

## 2014-11-27 ENCOUNTER — Ambulatory Visit (INDEPENDENT_AMBULATORY_CARE_PROVIDER_SITE_OTHER): Payer: Medicare PPO | Admitting: *Deleted

## 2014-11-27 DIAGNOSIS — Z7901 Long term (current) use of anticoagulants: Secondary | ICD-10-CM | POA: Diagnosis not present

## 2014-11-27 DIAGNOSIS — I82409 Acute embolism and thrombosis of unspecified deep veins of unspecified lower extremity: Secondary | ICD-10-CM

## 2014-11-27 DIAGNOSIS — Z5181 Encounter for therapeutic drug level monitoring: Secondary | ICD-10-CM

## 2014-11-27 LAB — POCT INR: INR: 1.8

## 2014-12-18 ENCOUNTER — Ambulatory Visit (INDEPENDENT_AMBULATORY_CARE_PROVIDER_SITE_OTHER): Payer: Medicare PPO | Admitting: *Deleted

## 2014-12-18 DIAGNOSIS — Z5181 Encounter for therapeutic drug level monitoring: Secondary | ICD-10-CM

## 2014-12-18 DIAGNOSIS — I82409 Acute embolism and thrombosis of unspecified deep veins of unspecified lower extremity: Secondary | ICD-10-CM

## 2014-12-18 DIAGNOSIS — Z7901 Long term (current) use of anticoagulants: Secondary | ICD-10-CM | POA: Diagnosis not present

## 2014-12-18 LAB — POCT INR: INR: 2.4

## 2015-01-15 ENCOUNTER — Ambulatory Visit (INDEPENDENT_AMBULATORY_CARE_PROVIDER_SITE_OTHER): Payer: Medicare PPO | Admitting: Pharmacist

## 2015-01-15 DIAGNOSIS — Z5181 Encounter for therapeutic drug level monitoring: Secondary | ICD-10-CM

## 2015-01-15 DIAGNOSIS — Z7901 Long term (current) use of anticoagulants: Secondary | ICD-10-CM

## 2015-01-15 DIAGNOSIS — I82409 Acute embolism and thrombosis of unspecified deep veins of unspecified lower extremity: Secondary | ICD-10-CM | POA: Diagnosis not present

## 2015-01-15 LAB — POCT INR: INR: 2

## 2015-02-12 ENCOUNTER — Ambulatory Visit (INDEPENDENT_AMBULATORY_CARE_PROVIDER_SITE_OTHER): Payer: Medicare PPO | Admitting: Pharmacist

## 2015-02-12 ENCOUNTER — Encounter: Payer: Self-pay | Admitting: Cardiology

## 2015-02-12 ENCOUNTER — Ambulatory Visit (INDEPENDENT_AMBULATORY_CARE_PROVIDER_SITE_OTHER): Payer: Medicare PPO | Admitting: Cardiology

## 2015-02-12 VITALS — BP 160/70 | HR 55 | Ht 62.0 in | Wt 121.0 lb

## 2015-02-12 DIAGNOSIS — I2510 Atherosclerotic heart disease of native coronary artery without angina pectoris: Secondary | ICD-10-CM | POA: Diagnosis not present

## 2015-02-12 DIAGNOSIS — E785 Hyperlipidemia, unspecified: Secondary | ICD-10-CM

## 2015-02-12 DIAGNOSIS — Z7901 Long term (current) use of anticoagulants: Secondary | ICD-10-CM | POA: Diagnosis not present

## 2015-02-12 DIAGNOSIS — Z86718 Personal history of other venous thrombosis and embolism: Secondary | ICD-10-CM

## 2015-02-12 DIAGNOSIS — I82409 Acute embolism and thrombosis of unspecified deep veins of unspecified lower extremity: Secondary | ICD-10-CM

## 2015-02-12 DIAGNOSIS — I350 Nonrheumatic aortic (valve) stenosis: Secondary | ICD-10-CM | POA: Diagnosis not present

## 2015-02-12 DIAGNOSIS — Z5181 Encounter for therapeutic drug level monitoring: Secondary | ICD-10-CM

## 2015-02-12 DIAGNOSIS — I10 Essential (primary) hypertension: Secondary | ICD-10-CM | POA: Diagnosis not present

## 2015-02-12 LAB — POCT INR: INR: 2

## 2015-02-12 NOTE — Patient Instructions (Signed)
Your physician wants you to follow-up in: 6 months with Dr.McDowell. You will receive a reminder letter in the mail two months in advance. If you don't receive a letter, please call our office to schedule the follow-up appointment.  Your physician recommends that you continue on your current medications as directed. Please refer to the Current Medication list given to you today.  Your physician has requested that you have an echocardiogram. Echocardiography is a painless test that uses sound waves to create images of your heart. It provides your doctor with information about the size and shape of your heart and how well your heart's chambers and valves are working. This procedure takes approximately one hour. There are no restrictions for this procedure.   If you need a refill on your cardiac medications before your next appointment, please call your pharmacy.  Thank you for choosing [facility]!

## 2015-02-12 NOTE — Progress Notes (Signed)
Cardiology Office Note  Date: 02/12/2015   ID: Danielle Murphy, DOB 1941/01/09, MRN [id number]  PCP: Lanette Hampshire, MD  Primary Cardiologist: [name], MD   Chief Complaint  Patient presents with  . Coronary Artery Disease  . Aortic Stenosis    History of Present Illness: Danielle Murphy is a 75 y.o. female last seen in July 2015. She presents for a routine follow-up visit today. Since last evaluation, she reports no angina symptoms and stable NYHA class II dyspnea. Over the winter months she works part time at [employer], usually 3 days a week. She walks for exercise and likes to get outdoors when the weather is appropriate.  We reviewed her medications, she continues on aspirin, Cozaar, Toprol-XL, Pravachol, and Coumadin (history of recurrent DVT). She is followed in the anticoagulation clinic with recent INR 2.0. She reports no spontaneous bleeding problems.  She reports having a health screening done last year with assessment of lipids and also carotids. She will bring in the report for her next visit. In the meanwhile, she has not yet decided what to do with the impending retirement of Dr. Everette Rank.  Today we discussed her most recent cardiac structural and ischemic testing. She had a low risk Cardiolite study back in 2013. Her last echocardiogram in 2014 showed mild aortic stenosis.  Past Medical History  Diagnosis Date  . Coronary atherosclerosis of native coronary artery     BMS LAD 2002  . Hyperlipidemia   . Essential hypertension, benign   . Aortic stenosis   . Pericardial cyst     Status post excision 2002  . Pulmonary embolism ([place])   . DVT (deep venous thrombosis) [facility])     Past Surgical History  Procedure Laterality Date  . Pericardial cyst excision  2002    Current Outpatient Prescriptions  Medication Sig Dispense Refill  . aspirin 81 MG tablet Take 81 mg by mouth every morning.     . calcium gluconate 500 MG tablet Take 500 mg by mouth daily.      Marland Kitchen losartan  (COZAAR) 50 MG tablet Take 1 tablet (50 mg total) by mouth daily. 90 tablet 3  . metoprolol succinate (TOPROL XL) 50 MG 24 hr tablet Take 1 1/2 tablets daily (Patient taking differently: Take 25-50 mg by mouth 2 (two) times daily. 25mg  in the morning and 50mg  at bedtime) 135 tablet 3  . nitroGLYCERIN (NITROSTAT) 0.4 MG SL tablet Place 1 tablet (0.4 mg total) under the tongue every 5 (five) minutes as needed. 25 tablet 4  . pravastatin (PRAVACHOL) 20 MG tablet Take 1 tablet (20 mg total) by mouth daily. 90 tablet 3  . warfarin (COUMADIN) 4 MG tablet Take 1 tablet (4 mg total) by mouth as directed. (Patient taking differently: Take 2-4 mg by mouth See admin instructions. 4mg  on Mondays and Fridays. Take 2mg  on all other days) 135 tablet 3   No current facility-administered medications for this visit.   Allergies:  Aspirin; Niacin; Ramipril; and Simvastatin   Social History: The patient  reports that she has never smoked. She has never used smokeless tobacco. She reports that she does not drink alcohol or use illicit drugs.   ROS:  Please see the history of present illness. Otherwise, complete review of systems is positive for 2 falls since I last saw her. Neither was associated with syncope..  All other systems are reviewed and negative.   Physical Exam: VS:  BP 160/70 mmHg  Pulse 55  Ht  5\' 2"  (1.575 m)  Wt 121 lb (54.885 kg)  BMI 22.13 kg/m2  SpO2 97%, BMI Body mass index is 22.13 kg/(m^2).  Wt Readings from Last 3 Encounters:  02/12/15 121 lb (54.885 kg)  07/17/14 129 lb (58.514 kg)  07/31/13 137 lb (62.143 kg)    Patient appears comfortable.  HEENT: Conjunctiva and lids are normal, oropharynx clear.  Neck: Supple no elevated jugular venous pressure or carotid bruits.  Lungs: Clear without labored breathing at rest.  Cardiac: Regular rate and rhythm with 99991111 systolic murmur at the base, preserved second heart sound. No S3 gallop or pericardial rub.  Abdomen: Soft and nontender.  No hepatomegaly or bruits.  Extremities: No pitting edema. Distal pulses are 2+. Mild venous stasis.  Skin: Warm and dry.  Musko skeletal: No kyphosis.  Neuropsychiatric: Alert and oriented x3, affect appropriate.  ECG: Tracing from 07/18/2014 showed sinus rhythm.  Other Studies Reviewed Today:  Echocardiogram 01/11/2013: Study Conclusions  - Left ventricle: The cavity size was normal. Wall thickness was normal. Systolic function was normal. The estimated ejection fraction was in the range of 60% to 65%. Wall motion was normal; there were no regional wall motion abnormalities. Features are consistent with a pseudonormal left ventricular filling pattern, with concomitant abnormal relaxation and increased filling pressure (grade 2 diastolic dysfunction). Doppler parameters are consistent with high ventricular filling pressure. - Aortic valve: Valve area by planimetry was 1.45 cm squared. Mildly calcified annulus. Mildly thickened leaflets. There was mild stenosis. Peak velocity: 217cm/s (S). Mean gradient: 66mm Hg (S). - Mitral valve: Calcified annulus. Mildly thickened leaflets . Mild to moderate regurgitation. - Left atrium: The atrium was mildly to moderately dilated. - Right ventricle: RV systolic pressure: 123456 Hg (S, est). - Right atrium: The atrium was mildly dilated. - Tricuspid valve: Mild regurgitation. - Pulmonic valve: Mild regurgitation.  Exercise Cardiolite 04/21/2011: IMPRESSION: Negative stress nuclear myocardial study revealing adequate exercise tolerance, a negative stress EKG, normal left ventricular size and normal left ventricular systolic function. By scintigraphic imaging, there was physiologic apical thinning without convincing evidence for ischemia or infarction. Other findings as noted.  Assessment and Plan:  1. Symptomatically stable CAD status post BMS to the LAD in 2002. Last ischemic testing was in 2013 as outlined  above. Plan will be to continue medical therapy and observation for now.  2. Mild aortic stenosis, last echocardiogram was in 2014. No obvious new symptomatology. We will obtain a follow-up echocardiogram just prior to her next visit for reassessment.  3. Essential hypertension, blood pressure is elevated today. She reports compliance with her medications, continues on Cozaar and Toprol-XL. I have asked her to keep an eye on this, if trend continues would suggest further advancing Cozaar.  4. Hyperlipidemia, on Pravachol. She has had good lipid control over time. She will bring in her last health screening for her next visit.  5. History of recurrent DVT, on chronic Coumadin, followed in the anticoagulation clinic.  Current medicines were reviewed with the patient today.   Orders Placed This Encounter  Procedures  . Echocardiogram    Disposition: FU with me in 6 months.   Signed, Satira Sark, MD, [facility] 02/12/2015 9:57 AM    [facility] [place]. [address], [place], [place] [address] Phone: [phone]; Fax: [phone]

## 2015-03-12 ENCOUNTER — Ambulatory Visit (INDEPENDENT_AMBULATORY_CARE_PROVIDER_SITE_OTHER): Payer: Medicare PPO | Admitting: *Deleted

## 2015-03-12 DIAGNOSIS — Z7901 Long term (current) use of anticoagulants: Secondary | ICD-10-CM | POA: Diagnosis not present

## 2015-03-12 DIAGNOSIS — I82409 Acute embolism and thrombosis of unspecified deep veins of unspecified lower extremity: Secondary | ICD-10-CM

## 2015-03-12 DIAGNOSIS — Z5181 Encounter for therapeutic drug level monitoring: Secondary | ICD-10-CM

## 2015-03-12 LAB — POCT INR: INR: 2.4

## 2015-04-09 ENCOUNTER — Ambulatory Visit (INDEPENDENT_AMBULATORY_CARE_PROVIDER_SITE_OTHER): Payer: Medicare PPO | Admitting: *Deleted

## 2015-04-09 DIAGNOSIS — Z5181 Encounter for therapeutic drug level monitoring: Secondary | ICD-10-CM | POA: Diagnosis not present

## 2015-04-09 DIAGNOSIS — Z7901 Long term (current) use of anticoagulants: Secondary | ICD-10-CM | POA: Diagnosis not present

## 2015-04-09 DIAGNOSIS — I82409 Acute embolism and thrombosis of unspecified deep veins of unspecified lower extremity: Secondary | ICD-10-CM | POA: Diagnosis not present

## 2015-04-09 LAB — POCT INR: INR: 2.1

## 2015-05-07 ENCOUNTER — Ambulatory Visit (INDEPENDENT_AMBULATORY_CARE_PROVIDER_SITE_OTHER): Payer: Medicare PPO | Admitting: *Deleted

## 2015-05-07 DIAGNOSIS — I82409 Acute embolism and thrombosis of unspecified deep veins of unspecified lower extremity: Secondary | ICD-10-CM

## 2015-05-07 DIAGNOSIS — Z5181 Encounter for therapeutic drug level monitoring: Secondary | ICD-10-CM | POA: Diagnosis not present

## 2015-05-07 DIAGNOSIS — Z7901 Long term (current) use of anticoagulants: Secondary | ICD-10-CM

## 2015-05-07 LAB — POCT INR: INR: 2.2

## 2015-06-16 ENCOUNTER — Other Ambulatory Visit: Payer: Self-pay | Admitting: Cardiology

## 2015-06-18 ENCOUNTER — Ambulatory Visit (INDEPENDENT_AMBULATORY_CARE_PROVIDER_SITE_OTHER): Payer: Medicare PPO | Admitting: *Deleted

## 2015-06-18 DIAGNOSIS — I82409 Acute embolism and thrombosis of unspecified deep veins of unspecified lower extremity: Secondary | ICD-10-CM

## 2015-06-18 DIAGNOSIS — Z5181 Encounter for therapeutic drug level monitoring: Secondary | ICD-10-CM | POA: Diagnosis not present

## 2015-06-18 DIAGNOSIS — Z7901 Long term (current) use of anticoagulants: Secondary | ICD-10-CM

## 2015-06-18 LAB — POCT INR: INR: 2.3

## 2015-07-30 ENCOUNTER — Ambulatory Visit (INDEPENDENT_AMBULATORY_CARE_PROVIDER_SITE_OTHER): Payer: Medicare PPO | Admitting: *Deleted

## 2015-07-30 DIAGNOSIS — Z7901 Long term (current) use of anticoagulants: Secondary | ICD-10-CM

## 2015-07-30 DIAGNOSIS — Z5181 Encounter for therapeutic drug level monitoring: Secondary | ICD-10-CM | POA: Diagnosis not present

## 2015-07-30 DIAGNOSIS — I82409 Acute embolism and thrombosis of unspecified deep veins of unspecified lower extremity: Secondary | ICD-10-CM

## 2015-07-30 LAB — POCT INR: INR: 1.9

## 2015-08-14 ENCOUNTER — Other Ambulatory Visit (HOSPITAL_COMMUNITY): Payer: Self-pay | Admitting: Internal Medicine

## 2015-08-14 DIAGNOSIS — Z86711 Personal history of pulmonary embolism: Secondary | ICD-10-CM

## 2015-08-14 DIAGNOSIS — R2232 Localized swelling, mass and lump, left upper limb: Secondary | ICD-10-CM

## 2015-08-18 ENCOUNTER — Ambulatory Visit (HOSPITAL_COMMUNITY)
Admission: RE | Admit: 2015-08-18 | Discharge: 2015-08-18 | Disposition: A | Discharge status: Home / Self Care | Payer: Medicare PPO | Source: Ambulatory Visit | Attending: Cardiology | Admitting: Cardiology

## 2015-08-18 DIAGNOSIS — I350 Nonrheumatic aortic (valve) stenosis: Secondary | ICD-10-CM | POA: Diagnosis present

## 2015-08-18 DIAGNOSIS — I340 Nonrheumatic mitral (valve) insufficiency: Secondary | ICD-10-CM | POA: Diagnosis not present

## 2015-08-18 DIAGNOSIS — I071 Rheumatic tricuspid insufficiency: Secondary | ICD-10-CM | POA: Insufficient documentation

## 2015-08-18 DIAGNOSIS — I119 Hypertensive heart disease without heart failure: Secondary | ICD-10-CM | POA: Insufficient documentation

## 2015-08-18 DIAGNOSIS — Z7901 Long term (current) use of anticoagulants: Secondary | ICD-10-CM | POA: Insufficient documentation

## 2015-08-18 DIAGNOSIS — E785 Hyperlipidemia, unspecified: Secondary | ICD-10-CM | POA: Insufficient documentation

## 2015-08-18 DIAGNOSIS — I2510 Atherosclerotic heart disease of native coronary artery without angina pectoris: Secondary | ICD-10-CM | POA: Insufficient documentation

## 2015-08-18 NOTE — Progress Notes (Signed)
*  PRELIMINARY RESULTS* Echocardiogram 2D Echocardiogram has been performed.  Danielle Murphy 08/18/2015, 9:26 AM

## 2015-08-22 ENCOUNTER — Encounter (HOSPITAL_COMMUNITY): Payer: Self-pay

## 2015-08-22 ENCOUNTER — Ambulatory Visit (HOSPITAL_COMMUNITY)
Admission: RE | Admit: 2015-08-22 | Discharge: 2015-08-22 | Disposition: A | Discharge status: Home / Self Care | Payer: Medicare PPO | Source: Ambulatory Visit | Attending: Internal Medicine | Admitting: Internal Medicine

## 2015-08-22 NOTE — Progress Notes (Signed)
Cardiology Office Note  Date: 08/25/2015   ID: Danielle Murphy, DOB [birth year], MRN [id number]  PCP: Jani Gravel, MD  Primary Cardiologist: [name], MD   Chief Complaint  Patient presents with  . Coronary Artery Disease  . Aortic Stenosis    History of Present Illness: Danielle Murphy is a 75 y.o. female last seen in January. She presents for a routine follow-up visit. She does not report any chest pain with typical activities. Continues to work part-time, 15 hours a week at [employer].  She continues on Coumadin with follow-up in the anticoagulation clinic, history of recurrent DVTs. No reported bleeding episodes.  Recent follow-up echocardiogram is outlined below, mild to moderate aortic stenosis noted. We discussed the results today. She is asymptomatic. Cardiac murmur is stable.  Blood pressure elevated today, systolic in the [id number]. She tells me that she checks her blood pressure at home 3 days a week and that generally her systolics are in the [id number]. I have asked her to keep an eye on this.  Past Medical History:  Diagnosis Date  . Aortic stenosis   . Coronary atherosclerosis of native coronary artery    BMS LAD 2002  . DVT (deep venous thrombosis) ([place])   . Essential hypertension, benign   . Hyperlipidemia   . Pericardial cyst    Status post excision 2002  . Pulmonary embolism [facility])     Past Surgical History:  Procedure Laterality Date  . Pericardial cyst excision  2002    Current Outpatient Prescriptions  Medication Sig Dispense Refill  . aspirin 81 MG tablet Take 81 mg by mouth every morning.     . calcium gluconate 500 MG tablet Take 500 mg by mouth daily.      Marland Kitchen losartan (COZAAR) 50 MG tablet TAKE 1 TABLET EVERY DAY 90 tablet 3  . metoprolol succinate (TOPROL-XL) 50 MG 24 hr tablet TAKE 1 AND 1/2 TABLETS EVERY DAY 135 tablet 3  . nitroGLYCERIN (NITROSTAT) 0.4 MG SL tablet Place 1 tablet (0.4 mg total) under the tongue every 5 (five) minutes as needed. 25 tablet 4    . pravastatin (PRAVACHOL) 20 MG tablet TAKE 1 TABLET EVERY DAY 90 tablet 3  . warfarin (COUMADIN) 4 MG tablet TAKE 1 TABLET AS DIRECTED 135 tablet 3   No current facility-administered medications for this visit.    Allergies:  Aspirin; Niacin; Ramipril; and Simvastatin   Social History: The patient  reports that she has never smoked. She has never used smokeless tobacco. She reports that she does not drink alcohol or use drugs.   ROS:  Please see the history of present illness. Otherwise, complete review of systems is positive for intermittent dry cough.  All other systems are reviewed and negative.   Physical Exam: VS:  BP (!) 164/60   Pulse 60   Ht 5\' 2"  (1.575 m)   Wt 117 lb (53.1 kg)   SpO2 97%   BMI 21.40 kg/m , BMI Body mass index is 21.4 kg/m.  Wt Readings from Last 3 Encounters:  08/25/15 117 lb (53.1 kg)  02/12/15 121 lb (54.9 kg)  07/17/14 129 lb (58.5 kg)    Elderly woman, appears comfortable.  HEENT: Conjunctiva and lids are normal, oropharynx clear.  Neck: Supple no elevated jugular venous pressure or carotid bruits.  Lungs: Clear without labored breathing at rest.  Cardiac: Regular rate and rhythm with 3/6 systolic murmur at the base, preserved second heart sound. No S3 gallop or pericardial rub.  Abdomen: Soft and nontender. No hepatomegaly or bruits.  Extremities: No pitting edema. Distal pulses are 2+. Mild venous stasis.  Skin: Warm and dry.  Musko skeletal: No kyphosis.  Neuropsychiatric: Alert and oriented x3, affect appropriate.   ECG: I personally reviewed the tracing from 07/17/2014 which showed sinus rhythm.  Other Studies Reviewed Today:  Exercise Cardiolite 04/21/2011: IMPRESSION: Negative stress nuclear myocardial study revealing adequate exercise tolerance, a negative stress EKG, normal left ventricular size and normal left ventricular systolic function. By scintigraphic imaging, there was physiologic apical thinning without  convincing evidence for ischemia or infarction. Other findings as noted.  Echocardiogram 08/18/2015: Study Conclusions  - Left ventricle: The cavity size was normal. Wall thickness was   normal. Systolic function was normal. The estimated ejection   fraction was in the range of 60% to 65%. Wall motion was normal;   there were no regional wall motion abnormalities. Features are   consistent with a pseudonormal left ventricular filling pattern,   with concomitant abnormal relaxation and increased filling   pressure (grade 2 diastolic dysfunction). Doppler parameters are   consistent with high ventricular filling pressure. - Aortic valve: Moderately calcified annulus. Trileaflet; mildly   thickened, mildly calcified leaflets. There was mild to moderate   stenosis. Peak velocity (S): 218 cm/s. Mean gradient (S): 10 mm   Hg. Valve area (VTI): 1.17 cm^2. Valve area (Vmax): 1.27 cm^2.   Valve area (Vmean): 1.32 cm^2. - Mitral valve: Calcified annulus. There was moderate   regurgitation. - Left atrium: The atrium was mildly dilated. - Right atrium: The atrium was mildly dilated. - Tricuspid valve: There was mild-moderate regurgitation. - Pulmonary arteries: Systolic pressure was mildly increased. PA   peak pressure: 40 mm Hg (S). - Systemic veins: IVC dilated with normal respiratory variation.   Estimated CVP 8 mmHg.  Assessment and Plan:  1. Symptomatically stable CAD status post BMS to the LAD in 2002. She had low risk stress testing in 2013. I reviewed her ECG today which shows sinus bradycardia, anteroseptal Q waves and no significant ST segment changes. We will continue observation, can discuss follow-up stress testing over the next year.  2. Mild to moderate calcific aortic stenosis. She is asymptomatic.  3. Essential hypertension, blood pressure elevated today. She reports compliance with Cozaar and Toprol-XL. Discussed sodium restriction. Keep follow-up with Dr. Maudie Mercury. Continue to  follow blood pressure at home, which by her description looks to be better controlled.  4. Hyperlipidemia, continues on Pravachol. Keep follow-up with Dr. Maudie Mercury for lipid lab work.  5. History of recurrent DVTs, followed in the anticoagulation clinic on Coumadin.  Current medicines were reviewed with the patient today.   Orders Placed This Encounter  Procedures  . EKG 12-Lead    Disposition: Follow-up with me in 6 months.  Signed, Satira Sark, MD, [facility] 08/25/2015 8:37 AM    [place] [facility] [address] [place], [place] [address] Phone: [phone]; Fax: [phone]

## 2015-08-25 ENCOUNTER — Ambulatory Visit (INDEPENDENT_AMBULATORY_CARE_PROVIDER_SITE_OTHER): Payer: Medicare PPO | Admitting: Cardiology

## 2015-08-25 ENCOUNTER — Ambulatory Visit (INDEPENDENT_AMBULATORY_CARE_PROVIDER_SITE_OTHER): Payer: Medicare PPO | Admitting: *Deleted

## 2015-08-25 ENCOUNTER — Encounter: Payer: Self-pay | Admitting: Cardiology

## 2015-08-25 VITALS — BP 164/60 | HR 60 | Ht 62.0 in | Wt 117.0 lb

## 2015-08-25 DIAGNOSIS — I10 Essential (primary) hypertension: Secondary | ICD-10-CM | POA: Diagnosis not present

## 2015-08-25 DIAGNOSIS — I82402 Acute embolism and thrombosis of unspecified deep veins of left lower extremity: Secondary | ICD-10-CM

## 2015-08-25 DIAGNOSIS — I2510 Atherosclerotic heart disease of native coronary artery without angina pectoris: Secondary | ICD-10-CM | POA: Diagnosis not present

## 2015-08-25 DIAGNOSIS — E785 Hyperlipidemia, unspecified: Secondary | ICD-10-CM

## 2015-08-25 DIAGNOSIS — I350 Nonrheumatic aortic (valve) stenosis: Secondary | ICD-10-CM

## 2015-08-25 DIAGNOSIS — Z86718 Personal history of other venous thrombosis and embolism: Secondary | ICD-10-CM

## 2015-08-25 DIAGNOSIS — I82409 Acute embolism and thrombosis of unspecified deep veins of unspecified lower extremity: Secondary | ICD-10-CM | POA: Diagnosis not present

## 2015-08-25 DIAGNOSIS — Z7901 Long term (current) use of anticoagulants: Secondary | ICD-10-CM | POA: Diagnosis not present

## 2015-08-25 DIAGNOSIS — Z5181 Encounter for therapeutic drug level monitoring: Secondary | ICD-10-CM

## 2015-08-25 LAB — POCT INR: INR: 2.1

## 2015-08-25 NOTE — Patient Instructions (Signed)
Your physician wants you to follow-up in: 6 months You will receive a reminder letter in the mail two months in advance. If you don't receive a letter, please call our office to schedule the follow-up appointment.     Your physician recommends that you continue on your current medications as directed. Please refer to the Current Medication list given to you today.      Thank you for choosing [facility] !        

## 2015-09-05 ENCOUNTER — Emergency Department (HOSPITAL_COMMUNITY): Payer: Medicare PPO

## 2015-09-05 ENCOUNTER — Emergency Department (HOSPITAL_COMMUNITY)
Admission: EM | Admit: 2015-09-05 | Discharge: 2015-09-05 | Disposition: A | Discharge status: Home / Self Care | Payer: Medicare PPO | Attending: Emergency Medicine | Admitting: Emergency Medicine

## 2015-09-05 ENCOUNTER — Encounter (HOSPITAL_COMMUNITY): Payer: Self-pay

## 2015-09-05 DIAGNOSIS — S0083XA Contusion of other part of head, initial encounter: Secondary | ICD-10-CM | POA: Insufficient documentation

## 2015-09-05 DIAGNOSIS — D6832 Hemorrhagic disorder due to extrinsic circulating anticoagulants: Secondary | ICD-10-CM

## 2015-09-05 DIAGNOSIS — Z7901 Long term (current) use of anticoagulants: Secondary | ICD-10-CM | POA: Diagnosis not present

## 2015-09-05 DIAGNOSIS — Y9389 Activity, other specified: Secondary | ICD-10-CM | POA: Diagnosis not present

## 2015-09-05 DIAGNOSIS — Z7982 Long term (current) use of aspirin: Secondary | ICD-10-CM | POA: Insufficient documentation

## 2015-09-05 DIAGNOSIS — Y929 Unspecified place or not applicable: Secondary | ICD-10-CM | POA: Insufficient documentation

## 2015-09-05 DIAGNOSIS — D688 Other specified coagulation defects: Secondary | ICD-10-CM | POA: Insufficient documentation

## 2015-09-05 DIAGNOSIS — W01198A Fall on same level from slipping, tripping and stumbling with subsequent striking against other object, initial encounter: Secondary | ICD-10-CM | POA: Diagnosis not present

## 2015-09-05 DIAGNOSIS — I10 Essential (primary) hypertension: Secondary | ICD-10-CM | POA: Insufficient documentation

## 2015-09-05 DIAGNOSIS — W010XXA Fall on same level from slipping, tripping and stumbling without subsequent striking against object, initial encounter: Secondary | ICD-10-CM

## 2015-09-05 DIAGNOSIS — Z79899 Other long term (current) drug therapy: Secondary | ICD-10-CM | POA: Diagnosis not present

## 2015-09-05 DIAGNOSIS — S5002XA Contusion of left elbow, initial encounter: Secondary | ICD-10-CM

## 2015-09-05 DIAGNOSIS — S0990XA Unspecified injury of head, initial encounter: Secondary | ICD-10-CM | POA: Diagnosis present

## 2015-09-05 DIAGNOSIS — T45515A Adverse effect of anticoagulants, initial encounter: Secondary | ICD-10-CM

## 2015-09-05 DIAGNOSIS — Y990 Civilian activity done for income or pay: Secondary | ICD-10-CM | POA: Insufficient documentation

## 2015-09-05 DIAGNOSIS — I2510 Atherosclerotic heart disease of native coronary artery without angina pectoris: Secondary | ICD-10-CM | POA: Diagnosis not present

## 2015-09-05 LAB — PROTIME-INR
INR: 2.12
PROTHROMBIN TIME: 24.1 s — AB (ref 11.4–15.2)

## 2015-09-05 MED ORDER — ACETAMINOPHEN 325 MG PO TABS
650.0000 mg | ORAL_TABLET | Freq: Once | ORAL | Status: DC
Start: 2015-09-05 — End: 2015-09-06
  Filled 2015-09-05: qty 2

## 2015-09-05 NOTE — ED Notes (Signed)
Patient verbalizes understanding of discharge instructions, home care and follow up care. Patient out of department at this time. 

## 2015-09-05 NOTE — Discharge Instructions (Signed)
It was our pleasure to provide your ER care today - we hope that you feel better.  Elevate elbow/arm.  Ice/coldpack to sore area.  Take tylenol as need.  Follow up with primary care doctor in the coming week.  Return to ER if worse, new symptoms, new or severe pain, numbness, other concern.

## 2015-09-05 NOTE — ED Provider Notes (Signed)
[facility] DEPT Provider Note   CSN: [id number] Arrival date & time: 09/05/15  2053  First Provider Contact:  First MD Initiated Contact with Patient 09/05/15 2135      By signing my name below, I, Emmanuella Mensah, attest that this documentation has been prepared under the direction and in the presence of Lajean Saver, MD. Electronically Signed: Judithann Sauger, ED Scribe. 09/05/15. 9:52 PM.   History   Chief Complaint Chief Complaint  Patient presents with  . Fall    HPI Comments: Danielle Murphy is a 75 y.o. female who presents to the Emergency Department complaining of ongoing moderate left elbow pain with swelling and multiple bleeding abrasions s/p fall that occurred PTA. She reports associated mild generalized headache post fall and contusion to left forehead. Pt explained that they are working on sidewalk/construction, and that she tripped and fell on the uneven sidewalk. No LOC but pt hit her head. No alleviating factors noted. Pt has not tried any medications PTA. She is currently on  Coumadin. No fever, chills, n/v, or rash. No abn bleeding or bruising. Felt fine just prior to trip and fall. Tetanus up to date. No neck or back pain. No nv.    The history is provided by the patient. No language interpreter was used.    Past Medical History:  Diagnosis Date  . Aortic stenosis   . Coronary atherosclerosis of native coronary artery    BMS LAD 2002  . DVT (deep venous thrombosis) ([place])   . Essential hypertension, benign   . Hyperlipidemia   . Pericardial cyst    Status post excision 2002  . Pulmonary embolism [facility])     Patient Active Problem List   Diagnosis Date Noted  . Encounter for therapeutic drug monitoring 03/08/2013  . Pulmonary embolus ([place]) 10/14/2010  . Long term current use of anticoagulant 04/21/2010  . CORONARY ATHEROSCLEROSIS NATIVE CORONARY ARTERY 09/03/2009  . Aortic valve disorders 07/24/2008  . 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 DYSLIPIDEMIA 07/22/2008  . HYPERTENSION 07/22/2008  . DVT 07/22/2008    Past Surgical History:  Procedure Laterality Date  . Pericardial cyst excision  2002    OB History    No data available       Home Medications    Prior to Admission medications   Medication Sig Start Date End Date Taking? Authorizing Provider  aspirin 81 MG tablet Take 81 mg by mouth every morning.     Historical Provider, MD  calcium gluconate 500 MG tablet Take 500 mg by mouth daily.      Historical Provider, MD  losartan (COZAAR) 50 MG tablet TAKE 1 TABLET EVERY DAY 06/16/15   Satira Sark, MD  metoprolol succinate (TOPROL-XL) 50 MG 24 hr tablet TAKE 1 AND 1/2 TABLETS EVERY DAY 06/16/15   Satira Sark, MD  nitroGLYCERIN (NITROSTAT) 0.4 MG SL tablet Place 1 tablet (0.4 mg total) under the tongue every 5 (five) minutes as needed. 07/18/12   Satira Sark, MD  pravastatin (PRAVACHOL) 20 MG tablet TAKE 1 TABLET EVERY DAY 06/16/15   Satira Sark, MD  warfarin (COUMADIN) 4 MG tablet TAKE 1 TABLET AS DIRECTED 06/16/15   Satira Sark, MD    Family History No family history on file.  Social History Social History  Substance Use Topics  . Smoking status: Never Smoker  . Smokeless tobacco: Never Used  . Alcohol use No     Allergies   Aspirin;  Niacin; Ramipril; and Simvastatin   Review of Systems Review of Systems  Constitutional: Negative for fever.  HENT: Negative for nosebleeds.   Eyes: Negative for pain and visual disturbance.  Respiratory: Negative for shortness of breath.   Cardiovascular: Negative for chest pain.  Gastrointestinal: Negative for abdominal pain, nausea and vomiting.  Genitourinary: Negative for flank pain.  Musculoskeletal: Positive for arthralgias and joint swelling. Negative for back pain and neck pain.  Skin: Positive for wound. Negative for rash.  Neurological: Positive for headaches. Negative for weakness and numbness.  Hematological:        On coumadin  Psychiatric/Behavioral: Negative for confusion.     Physical Exam Updated Vital Signs BP 180/64 (BP Location: Right Arm)   Pulse 61   Temp 98.2 F (36.8 C) (Oral)   Resp 16   Ht 5' 2.5" (1.588 m)   Wt 118 lb (53.5 kg)   SpO2 99%   BMI 21.24 kg/m   Physical Exam  Constitutional: She is oriented to person, place, and time. She appears well-developed and well-nourished.  HENT:  Head: Normocephalic.  Small contusion to left forehead  Eyes: Pupils are equal, round, and reactive to light.  Neck: Normal range of motion.  Cardiovascular: Normal rate, normal heart sounds and intact distal pulses.   No murmur heard. Pulmonary/Chest: Effort normal and breath sounds normal. She exhibits no tenderness.  Abdominal: Soft. Bowel sounds are normal. She exhibits no distension. There is no tenderness.  Musculoskeletal:  Swelling, tenderness to left elbow. Abrasion to area. No active bleeding. Compartments of arm/forearm, soft, not tense. Distal pulses palp. CTLS spine, non tender, aligned, no step off.   Neurological: She is alert and oriented to person, place, and time.  Speech clear/fluent. Motor intact bil. Steady gait.   Skin: Skin is warm and dry.  Psychiatric: She has a normal mood and affect.  Nursing note and vitals reviewed.    ED Treatments / Results  DIAGNOSTIC STUDIES: Oxygen Saturation is 99% on RA, normal by my interpretation.    COORDINATION OF CARE: 9:49 PM- Pt advised of plan for treatment and pt agrees. Pt will receive x-ray and CT scan for further evaluation.    Labs (all labs ordered are listed, but only abnormal results are displayed) Results for orders placed or performed in visit on 07/30/15  POCT INR  Result Value Ref Range   INR 2.1    Dg Elbow Complete Left (3+view)  Result Date: 09/05/2015 CLINICAL DATA:  Tripped on sidewalk at home, fell injuring LEFT elbow, with pain, swelling and bleeding abrasions EXAM: LEFT ELBOW - COMPLETE 3+ VIEW  COMPARISON:  None FINDINGS: Diffuse soft tissue swelling at LEFT elbow particularly dorsally, somewhat dense in appearance, question hematoma. Bones appear demineralized. Joint spaces preserved. No acute fracture, dislocation, or bone destruction. IMPRESSION: No acute osseous abnormalities. Soft tissue swelling and suspected hematoma at LEFT elbow. Electronically Signed   By: Lavonia Dana M.D.   On: 09/05/2015 22:54   Ct Head Wo Contrast  Result Date: 09/05/2015 CLINICAL DATA:  Tripped and fell on sidewalk at home, struck head, no loss of consciousness, headache EXAM: CT HEAD WITHOUT CONTRAST TECHNIQUE: Contiguous axial images were obtained from the base of the skull through the vertex without intravenous contrast. Sagittal and coronal MPR images reconstructed from axial data set. COMPARISON:  07/17/2014 FINDINGS: Generalized atrophy. Normal ventricular morphology. No midline shift or mass effect. Minimal small vessel chronic ischemic changes of deep cerebral white matter. No intracranial hemorrhage, mass lesion, or  acute infarction. Dense calcification in the anterior falx. No extra-axial fluid collections. Visualized paranasal sinuses and mastoid air cells clear. Bones unremarkable. IMPRESSION: Atrophy with minimal small vessel chronic ischemic changes of deep cerebral white matter. No acute intracranial abnormalities. Electronically Signed   By: Lavonia Dana M.D.   On: 09/05/2015 22:56    EKG  EKG Interpretation None       Radiology Dg Elbow Complete Left (3+view)  Result Date: 09/05/2015 CLINICAL DATA:  Tripped on sidewalk at home, fell injuring LEFT elbow, with pain, swelling and bleeding abrasions EXAM: LEFT ELBOW - COMPLETE 3+ VIEW COMPARISON:  None FINDINGS: Diffuse soft tissue swelling at LEFT elbow particularly dorsally, somewhat dense in appearance, question hematoma. Bones appear demineralized. Joint spaces preserved. No acute fracture, dislocation, or bone destruction. IMPRESSION: No  acute osseous abnormalities. Soft tissue swelling and suspected hematoma at LEFT elbow. Electronically Signed   By: Lavonia Dana M.D.   On: 09/05/2015 22:54   Ct Head Wo Contrast  Result Date: 09/05/2015 CLINICAL DATA:  Tripped and fell on sidewalk at home, struck head, no loss of consciousness, headache EXAM: CT HEAD WITHOUT CONTRAST TECHNIQUE: Contiguous axial images were obtained from the base of the skull through the vertex without intravenous contrast. Sagittal and coronal MPR images reconstructed from axial data set. COMPARISON:  07/17/2014 FINDINGS: Generalized atrophy. Normal ventricular morphology. No midline shift or mass effect. Minimal small vessel chronic ischemic changes of deep cerebral white matter. No intracranial hemorrhage, mass lesion, or acute infarction. Dense calcification in the anterior falx. No extra-axial fluid collections. Visualized paranasal sinuses and mastoid air cells clear. Bones unremarkable. IMPRESSION: Atrophy with minimal small vessel chronic ischemic changes of deep cerebral white matter. No acute intracranial abnormalities. Electronically Signed   By: Lavonia Dana M.D.   On: 09/05/2015 22:56    Procedures Procedures (including critical care time)  Medications Ordered in ED Medications - No data to display   Initial Impression / Assessment and Plan / ED Course  Lajean Saver, MD has reviewed the triage vital signs and the nursing notes.  Pertinent labs & imaging results that were available during my care of the patient were reviewed by me and considered in my medical decision making (see chart for details).  Clinical Course   I personally performed the services described in this documentation, which was scribed in my presence. The recorded information has been reviewed and considered. Lajean Saver, MD  Ct. Labs.   Xrays/ct neg acute. Tet utd. Tylenol po, icepack to sore area left elbow.   Recheck no new c/o. Pain controlled. No increased swelling to  elbow, compartments soft.  Patient currently appears stable for d/c.     Final Clinical Impressions(s) / ED Diagnoses   Final diagnoses:  None    New Prescriptions New Prescriptions   No medications on file     Lajean Saver, MD 09/05/15 2305

## 2015-09-05 NOTE — ED Triage Notes (Signed)
I tripped over the side walk and fell.  I hit my left arm and skinned it and I hit my forehead per pt.  I am on blood thinners per pt.

## 2015-09-09 ENCOUNTER — Emergency Department (HOSPITAL_COMMUNITY)
Admission: EM | Admit: 2015-09-09 | Discharge: 2015-09-09 | Disposition: A | Discharge status: Home / Self Care | Payer: Medicare PPO | Attending: Emergency Medicine | Admitting: Emergency Medicine

## 2015-09-09 ENCOUNTER — Encounter (HOSPITAL_COMMUNITY): Payer: Self-pay

## 2015-09-09 DIAGNOSIS — W01198A Fall on same level from slipping, tripping and stumbling with subsequent striking against other object, initial encounter: Secondary | ICD-10-CM | POA: Diagnosis not present

## 2015-09-09 DIAGNOSIS — S5002XA Contusion of left elbow, initial encounter: Secondary | ICD-10-CM | POA: Diagnosis present

## 2015-09-09 DIAGNOSIS — Z7982 Long term (current) use of aspirin: Secondary | ICD-10-CM | POA: Diagnosis not present

## 2015-09-09 DIAGNOSIS — Y939 Activity, unspecified: Secondary | ICD-10-CM | POA: Insufficient documentation

## 2015-09-09 DIAGNOSIS — Y990 Civilian activity done for income or pay: Secondary | ICD-10-CM | POA: Diagnosis not present

## 2015-09-09 DIAGNOSIS — Z7901 Long term (current) use of anticoagulants: Secondary | ICD-10-CM | POA: Insufficient documentation

## 2015-09-09 DIAGNOSIS — Y929 Unspecified place or not applicable: Secondary | ICD-10-CM | POA: Diagnosis not present

## 2015-09-09 DIAGNOSIS — Z79899 Other long term (current) drug therapy: Secondary | ICD-10-CM | POA: Insufficient documentation

## 2015-09-09 DIAGNOSIS — I10 Essential (primary) hypertension: Secondary | ICD-10-CM | POA: Diagnosis not present

## 2015-09-09 LAB — HEMOGLOBIN AND HEMATOCRIT, BLOOD
HCT: 33.9 % — ABNORMAL LOW (ref 36.0–46.0)
HEMOGLOBIN: 11.3 g/dL — AB (ref 12.0–15.0)

## 2015-09-09 LAB — PROTIME-INR
INR: 2.44
Prothrombin Time: 27 seconds — ABNORMAL HIGH (ref 11.4–15.2)

## 2015-09-09 NOTE — ED Triage Notes (Signed)
Patient was seen here for a fall on Friday.  Left lower arm completely bruised at this time.  Patient concerned since she is on coumadin.

## 2015-09-09 NOTE — ED Provider Notes (Signed)
[facility] DEPT Provider Note   CSN: [id number] Arrival date & time: 09/09/15  2101  By signing my name below, I, Danielle Murphy, attest that this documentation has been prepared under the direction and in the presence of Isla Pence, MD. Electronically signed, Danielle Murphy, ED Scribe. 09/09/15. 9:41 PM.   History   Chief Complaint Chief Complaint  Patient presents with  . Arm Problem    HPI HPI Comments: Danielle Murphy is a 75 y.o. female who presents to the Emergency Department complaining of unchanged bruising to her left forearm that has been persistent for the past five days. Pt fell five days ago and was seen here in the ED s/p fall. Per chart review, pt tripped and fell striking her left elbow on concrete. Pt takes Warfarin and is here for concerns of a blood clot; reports Hx of 2 DVT's in the past. Pt has apt with PCP in three days.   The history is provided by the patient. No language interpreter was used.    Past Medical History:  Diagnosis Date  . Aortic stenosis   . Coronary atherosclerosis of native coronary artery    BMS LAD 2002  . DVT (deep venous thrombosis) ([place])   . Essential hypertension, benign   . Hyperlipidemia   . Pericardial cyst    Status post excision 2002  . Pulmonary embolism [facility])     Patient Active Problem List   Diagnosis Date Noted  . Encounter for therapeutic drug monitoring 03/08/2013  . Pulmonary embolus ([place]) 10/14/2010  . Long term current use of anticoagulant 04/21/2010  . CORONARY ATHEROSCLEROSIS NATIVE CORONARY ARTERY 09/03/2009  . Aortic valve disorders 07/24/2008  . 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 DYSLIPIDEMIA 07/22/2008  . HYPERTENSION 07/22/2008  . DVT 07/22/2008    Past Surgical History:  Procedure Laterality Date  . Pericardial cyst excision  2002    OB History    No data available       Home Medications    Prior to Admission medications   Medication Sig Start Date End Date  Taking? Authorizing Provider  aspirin 81 MG tablet Take 81 mg by mouth every morning.     Historical Provider, MD  calcium gluconate 500 MG tablet Take 500 mg by mouth daily.      Historical Provider, MD  losartan (COZAAR) 50 MG tablet TAKE 1 TABLET EVERY DAY 06/16/15   Satira Sark, MD  metoprolol succinate (TOPROL-XL) 50 MG 24 hr tablet TAKE 1 AND 1/2 TABLETS EVERY DAY 06/16/15   Satira Sark, MD  nitroGLYCERIN (NITROSTAT) 0.4 MG SL tablet Place 1 tablet (0.4 mg total) under the tongue every 5 (five) minutes as needed. 07/18/12   Satira Sark, MD  pravastatin (PRAVACHOL) 20 MG tablet TAKE 1 TABLET EVERY DAY 06/16/15   Satira Sark, MD  warfarin (COUMADIN) 4 MG tablet TAKE 1 TABLET AS DIRECTED 06/16/15   Satira Sark, MD    Family History No family history on file.  Social History Social History  Substance Use Topics  . Smoking status: Never Smoker  . Smokeless tobacco: Never Used  . Alcohol use No     Allergies   Aspirin; Niacin; Ramipril; and Simvastatin   Review of Systems Review of Systems  Constitutional: Negative for fever.  Skin: Positive for color change (bruising to left forearm).  All other systems reviewed and are negative.    Physical Exam Updated Vital Signs BP 137/62 (BP Location:  Right Arm)   Pulse 85   Temp 98.5 F (36.9 C) (Oral)   Resp 16   Ht 5' 2.5" (1.588 m)   Wt 118 lb (53.5 kg)   SpO2 98%   BMI 21.24 kg/m   Physical Exam  Constitutional: She is oriented to person, place, and time. She appears well-developed and well-nourished. No distress.  HENT:  Head: Normocephalic and atraumatic.  Neck: Normal range of motion.  Pulmonary/Chest: Effort normal.  Musculoskeletal: Normal range of motion.  Ecchymosis on left forearm  Good ROM Large hematoma on left elbow  Neurological: She is alert and oriented to person, place, and time.  Skin: Skin is warm and dry. She is not diaphoretic.  Psychiatric: She has a normal mood and  affect. Judgment normal.  Nursing note and vitals reviewed.    ED Treatments / Results  DIAGNOSTIC STUDIES: Oxygen Saturation is 98% on RA, normal by my interpretation.  COORDINATION OF CARE: 9:30 PM-Will discharge. Discussed treatment plan with pt at bedside and pt agreed to plan.   Labs (all labs ordered are listed, but only abnormal results are displayed) Labs Reviewed  HEMOGLOBIN AND HEMATOCRIT, BLOOD - Abnormal; Notable for the following:       Result Value   Hemoglobin 11.3 (*)    HCT 33.9 (*)    All other components within normal limits  PROTIME-INR - Abnormal; Notable for the following:    Prothrombin Time 27.0 (*)    All other components within normal limits    EKG  EKG Interpretation None       Radiology No results found.  Procedures Procedures (including critical care time)  Medications Ordered in ED Medications - No data to display   Initial Impression / Assessment and Plan / ED Course  I have reviewed the triage vital signs and the nursing notes.  Pertinent labs & imaging results that were available during my care of the patient were reviewed by me and considered in my medical decision making (see chart for details).  Clinical Course   Pt's hgb is ok and INR is stable.  Pt's bruising likely from large hematoma spreading from gravity.  Pt encouraged to use her left arm lightly and to return if worse.  Final Clinical Impressions(s) / ED Diagnoses   Final diagnoses:  Traumatic hematoma of left elbow, initial encounter    New Prescriptions New Prescriptions   No medications on file  I personally performed the services described in this documentation, which was scribed in my presence. The recorded information has been reviewed and is accurate.     Isla Pence, MD 09/09/15 2229

## 2015-09-19 ENCOUNTER — Other Ambulatory Visit (HOSPITAL_COMMUNITY): Payer: Self-pay | Admitting: Internal Medicine

## 2015-09-19 DIAGNOSIS — Z1231 Encounter for screening mammogram for malignant neoplasm of breast: Secondary | ICD-10-CM

## 2015-09-19 DIAGNOSIS — Z780 Asymptomatic menopausal state: Secondary | ICD-10-CM

## 2015-09-22 ENCOUNTER — Ambulatory Visit (HOSPITAL_COMMUNITY)
Admission: RE | Admit: 2015-09-22 | Discharge: 2015-09-22 | Disposition: A | Discharge status: Home / Self Care | Payer: Medicare PPO | Source: Ambulatory Visit | Attending: Internal Medicine | Admitting: Internal Medicine

## 2015-09-22 ENCOUNTER — Ambulatory Visit (INDEPENDENT_AMBULATORY_CARE_PROVIDER_SITE_OTHER): Payer: Medicare PPO | Admitting: *Deleted

## 2015-09-22 DIAGNOSIS — Z1231 Encounter for screening mammogram for malignant neoplasm of breast: Secondary | ICD-10-CM | POA: Diagnosis not present

## 2015-09-22 DIAGNOSIS — M8588 Other specified disorders of bone density and structure, other site: Secondary | ICD-10-CM | POA: Insufficient documentation

## 2015-09-22 DIAGNOSIS — I82409 Acute embolism and thrombosis of unspecified deep veins of unspecified lower extremity: Secondary | ICD-10-CM

## 2015-09-22 DIAGNOSIS — Z7901 Long term (current) use of anticoagulants: Secondary | ICD-10-CM | POA: Diagnosis not present

## 2015-09-22 DIAGNOSIS — Z5181 Encounter for therapeutic drug level monitoring: Secondary | ICD-10-CM

## 2015-09-22 DIAGNOSIS — Z780 Asymptomatic menopausal state: Secondary | ICD-10-CM

## 2015-09-22 LAB — POCT INR: INR: 2.6

## 2015-10-20 ENCOUNTER — Ambulatory Visit (INDEPENDENT_AMBULATORY_CARE_PROVIDER_SITE_OTHER): Payer: Medicare PPO | Admitting: *Deleted

## 2015-10-20 DIAGNOSIS — I82409 Acute embolism and thrombosis of unspecified deep veins of unspecified lower extremity: Secondary | ICD-10-CM

## 2015-10-20 DIAGNOSIS — Z5181 Encounter for therapeutic drug level monitoring: Secondary | ICD-10-CM | POA: Diagnosis not present

## 2015-10-20 DIAGNOSIS — Z7901 Long term (current) use of anticoagulants: Secondary | ICD-10-CM

## 2015-10-20 LAB — POCT INR: INR: 3.4

## 2015-11-10 ENCOUNTER — Ambulatory Visit (INDEPENDENT_AMBULATORY_CARE_PROVIDER_SITE_OTHER): Payer: Medicare PPO | Admitting: *Deleted

## 2015-11-10 DIAGNOSIS — Z7901 Long term (current) use of anticoagulants: Secondary | ICD-10-CM

## 2015-11-10 DIAGNOSIS — Z5181 Encounter for therapeutic drug level monitoring: Secondary | ICD-10-CM

## 2015-11-10 DIAGNOSIS — I82409 Acute embolism and thrombosis of unspecified deep veins of unspecified lower extremity: Secondary | ICD-10-CM | POA: Diagnosis not present

## 2015-11-10 LAB — POCT INR: INR: 2.1

## 2015-12-08 ENCOUNTER — Ambulatory Visit (INDEPENDENT_AMBULATORY_CARE_PROVIDER_SITE_OTHER): Payer: Medicare PPO | Admitting: *Deleted

## 2015-12-08 DIAGNOSIS — I82409 Acute embolism and thrombosis of unspecified deep veins of unspecified lower extremity: Secondary | ICD-10-CM | POA: Diagnosis not present

## 2015-12-08 DIAGNOSIS — Z7901 Long term (current) use of anticoagulants: Secondary | ICD-10-CM | POA: Diagnosis not present

## 2015-12-08 DIAGNOSIS — Z5181 Encounter for therapeutic drug level monitoring: Secondary | ICD-10-CM

## 2015-12-08 LAB — POCT INR: INR: 1.7

## 2015-12-22 ENCOUNTER — Encounter: Payer: Self-pay | Admitting: *Deleted

## 2015-12-25 ENCOUNTER — Ambulatory Visit (INDEPENDENT_AMBULATORY_CARE_PROVIDER_SITE_OTHER): Payer: Medicare PPO | Admitting: *Deleted

## 2015-12-25 DIAGNOSIS — I82409 Acute embolism and thrombosis of unspecified deep veins of unspecified lower extremity: Secondary | ICD-10-CM | POA: Diagnosis not present

## 2015-12-25 DIAGNOSIS — Z7901 Long term (current) use of anticoagulants: Secondary | ICD-10-CM | POA: Diagnosis not present

## 2015-12-25 DIAGNOSIS — Z5181 Encounter for therapeutic drug level monitoring: Secondary | ICD-10-CM | POA: Diagnosis not present

## 2015-12-25 LAB — POCT INR: INR: 1.7

## 2016-01-07 ENCOUNTER — Ambulatory Visit (INDEPENDENT_AMBULATORY_CARE_PROVIDER_SITE_OTHER): Payer: Medicare PPO | Admitting: *Deleted

## 2016-01-07 DIAGNOSIS — Z7901 Long term (current) use of anticoagulants: Secondary | ICD-10-CM | POA: Diagnosis not present

## 2016-01-07 DIAGNOSIS — I82409 Acute embolism and thrombosis of unspecified deep veins of unspecified lower extremity: Secondary | ICD-10-CM | POA: Diagnosis not present

## 2016-01-07 DIAGNOSIS — Z5181 Encounter for therapeutic drug level monitoring: Secondary | ICD-10-CM

## 2016-01-07 LAB — POCT INR: INR: 3

## 2016-02-04 ENCOUNTER — Ambulatory Visit (INDEPENDENT_AMBULATORY_CARE_PROVIDER_SITE_OTHER): Payer: Medicare PPO | Admitting: *Deleted

## 2016-02-04 DIAGNOSIS — Z5181 Encounter for therapeutic drug level monitoring: Secondary | ICD-10-CM

## 2016-02-04 DIAGNOSIS — I82409 Acute embolism and thrombosis of unspecified deep veins of unspecified lower extremity: Secondary | ICD-10-CM | POA: Diagnosis not present

## 2016-02-04 DIAGNOSIS — Z7901 Long term (current) use of anticoagulants: Secondary | ICD-10-CM | POA: Diagnosis not present

## 2016-02-04 LAB — POCT INR: INR: 4.7

## 2016-02-18 ENCOUNTER — Ambulatory Visit (INDEPENDENT_AMBULATORY_CARE_PROVIDER_SITE_OTHER): Payer: Medicare PPO | Admitting: *Deleted

## 2016-02-18 DIAGNOSIS — Z7901 Long term (current) use of anticoagulants: Secondary | ICD-10-CM

## 2016-02-18 DIAGNOSIS — Z5181 Encounter for therapeutic drug level monitoring: Secondary | ICD-10-CM | POA: Diagnosis not present

## 2016-02-18 DIAGNOSIS — I82409 Acute embolism and thrombosis of unspecified deep veins of unspecified lower extremity: Secondary | ICD-10-CM

## 2016-02-18 LAB — POCT INR: INR: 3

## 2016-02-23 NOTE — Progress Notes (Signed)
Cardiology Office Note  Date: 02/24/2016   ID: Danielle Murphy, DOB [birth year], MRN [id number]  PCP: Danielle Gravel, MD  Primary Cardiologist: [name], MD   Chief Complaint  Patient presents with  . Coronary Artery Disease    History of Present Illness: Danielle Murphy is a 76 y.o. female last seen in July 2017. She presents for a routine follow-up visit. No reported functional decline since last encounter, no exertional chest pain.  She continues to follow in the anticoagulation clinic on Coumadin. She has a history of DVT and pulmonary embolus. Reports no bleeding problems.  I reviewed her medications which are outlined below and stable.  Last ischemic testing was in 2013 as outlined below. We did discuss planning to arrange a follow-up Myoview as well as an echocardiogram around the time that I see her in 6 months. Aortic stenosis was in the mild-to-moderate range by echocardiogram last year.  Past Medical History:  Diagnosis Date  . Aortic stenosis   . Coronary atherosclerosis of native coronary artery    BMS LAD 2002  . DVT (deep venous thrombosis) ([place])   . Essential hypertension, benign   . Hyperlipidemia   . Pericardial cyst    Status post excision 2002  . Pulmonary embolism [facility])     Past Surgical History:  Procedure Laterality Date  . Pericardial cyst excision  2002    Current Outpatient Prescriptions  Medication Sig Dispense Refill  . aspirin 81 MG tablet Take 81 mg by mouth every morning.     Marland Kitchen losartan (COZAAR) 50 MG tablet TAKE 1 TABLET EVERY DAY 90 tablet 3  . metoprolol succinate (TOPROL-XL) 50 MG 24 hr tablet TAKE 1 AND 1/2 TABLETS EVERY DAY 135 tablet 3  . nitroGLYCERIN (NITROSTAT) 0.4 MG SL tablet Place 1 tablet (0.4 mg total) under the tongue every 5 (five) minutes as needed. 25 tablet 4  . pravastatin (PRAVACHOL) 20 MG tablet TAKE 1 TABLET EVERY DAY 90 tablet 3  . warfarin (COUMADIN) 4 MG tablet TAKE 1 TABLET AS DIRECTED 135 tablet 3   No current  facility-administered medications for this visit.    Allergies:  Aspirin; Niacin; Ramipril; and Simvastatin   Social History: The patient  reports that she has never smoked. She has never used smokeless tobacco. She reports that she does not drink alcohol or use drugs.   ROS:  Please see the history of present illness. Otherwise, complete review of systems is positive for chronic lower back pain.  All other systems are reviewed and negative.   Physical Exam: VS:  BP 134/72   Pulse 66   Ht 5' (1.524 m)   Wt 120 lb (54.4 kg)   SpO2 94%   BMI 23.44 kg/m , BMI Body mass index is 23.44 kg/m.  Wt Readings from Last 3 Encounters:  02/24/16 120 lb (54.4 kg)  09/09/15 118 lb (53.5 kg)  09/05/15 118 lb (53.5 kg)    Elderly woman, appears comfortable.  HEENT: Conjunctiva and lids are normal, oropharynx clear.  Neck: Supple no elevated jugular venous pressure or carotid bruits.  Lungs: Clear without labored breathing at rest.  Cardiac: Regular rate and rhythm with 3/6 systolic murmur at the base, preserved second heart sound. No S3 gallop or pericardial rub.  Abdomen: Soft and nontender. No hepatomegaly or bruits.  Extremities: No pitting edema. Distal pulses are 2+. Mild venous stasis.  Skin: Warm and dry.  Musko skeletal: No kyphosis.  Neuropsychiatric: Alert and oriented x3, affect  appropriate.  ECG: I personally reviewed the tracing from 08/25/2015 which showed sinus bradycardia.  Recent Labwork: 09/09/2015: Hemoglobin 11.3   Other Studies Reviewed Today:  Echocardiogram 08/18/2015: Study Conclusions  - Left ventricle: The cavity size was normal. Wall thickness was   normal. Systolic function was normal. The estimated ejection   fraction was in the range of 60% to 65%. Wall motion was normal;   there were no regional wall motion abnormalities. Features are   consistent with a pseudonormal left ventricular filling pattern,   with concomitant abnormal relaxation and  increased filling   pressure (grade 2 diastolic dysfunction). Doppler parameters are   consistent with high ventricular filling pressure. - Aortic valve: Moderately calcified annulus. Trileaflet; mildly   thickened, mildly calcified leaflets. There was mild to moderate   stenosis. Peak velocity (S): 218 cm/s. Mean gradient (S): 10 mm   Hg. Valve area (VTI): 1.17 cm^2. Valve area (Vmax): 1.27 cm^2.   Valve area (Vmean): 1.32 cm^2. - Mitral valve: Calcified annulus. There was moderate   regurgitation. - Left atrium: The atrium was mildly dilated. - Right atrium: The atrium was mildly dilated. - Tricuspid valve: There was mild-moderate regurgitation. - Pulmonary arteries: Systolic pressure was mildly increased. PA   peak pressure: 40 mm Hg (S). - Systemic veins: IVC dilated with normal respiratory variation.   Estimated CVP 8 mmHg.  Exercise Cardiolite 04/21/2011: IMPRESSION: Negative stress nuclear myocardial study revealing adequate exercise tolerance, a negative stress EKG, normal left ventricular size and normal left ventricular systolic function. By scintigraphic imaging, there was physiologic apical thinning without convincing evidence for ischemia or infarction. Other findings as noted.  Assessment and Plan:  1. CAD status post BMS to the LAD in 2002. Last ischemic testing was in 2013. We will consider a follow-up Myoview around the time of her next visit. Continue with medical therapy.  2. Mild to moderate aortic stenosis by echocardiogram last year. Follow-up echocardiogram will be discussed at her next visit.  3. Essential hypertension, blood pressure control is adequate today.  4. Hyperlipidemia, continues on Pravachol with follow-up per Dr. Maudie Mercury.  5. History of DVT and pulmonary embolus. She continues on chronic Coumadin with follow-up in the anticoagulation clinic.  Current medicines were reviewed with the patient today.  Disposition: Follow-up in 6  months.  Signed, Satira Sark, MD, [facility] 02/24/2016 11:50 AM    [place] at [place]. [address], [place], [place] [address] Phone: [phone]; Fax: [phone]

## 2016-02-24 ENCOUNTER — Ambulatory Visit (INDEPENDENT_AMBULATORY_CARE_PROVIDER_SITE_OTHER): Payer: Medicare PPO | Admitting: Cardiology

## 2016-02-24 ENCOUNTER — Encounter: Payer: Self-pay | Admitting: Cardiology

## 2016-02-24 VITALS — BP 134/72 | HR 66 | Ht 60.0 in | Wt 120.0 lb

## 2016-02-24 DIAGNOSIS — I2510 Atherosclerotic heart disease of native coronary artery without angina pectoris: Secondary | ICD-10-CM

## 2016-02-24 DIAGNOSIS — I350 Nonrheumatic aortic (valve) stenosis: Secondary | ICD-10-CM

## 2016-02-24 DIAGNOSIS — I10 Essential (primary) hypertension: Secondary | ICD-10-CM

## 2016-02-24 DIAGNOSIS — E782 Mixed hyperlipidemia: Secondary | ICD-10-CM | POA: Diagnosis not present

## 2016-02-24 DIAGNOSIS — Z86718 Personal history of other venous thrombosis and embolism: Secondary | ICD-10-CM

## 2016-02-24 NOTE — Patient Instructions (Signed)
Your physician wants you to follow-up in: 6 months with Dr McDowell You will receive a reminder letter in the mail two months in advance. If you don't receive a letter, please call our office to schedule the follow-up appointment.     Your physician recommends that you continue on your current medications as directed. Please refer to the Current Medication list given to you today.    If you need a refill on your cardiac medications before your next appointment, please call your pharmacy.     Thank you for choosing [facility] !        

## 2016-03-10 ENCOUNTER — Ambulatory Visit (INDEPENDENT_AMBULATORY_CARE_PROVIDER_SITE_OTHER): Payer: Medicare PPO | Admitting: *Deleted

## 2016-03-10 DIAGNOSIS — I82409 Acute embolism and thrombosis of unspecified deep veins of unspecified lower extremity: Secondary | ICD-10-CM | POA: Diagnosis not present

## 2016-03-10 DIAGNOSIS — Z7901 Long term (current) use of anticoagulants: Secondary | ICD-10-CM | POA: Diagnosis not present

## 2016-03-10 DIAGNOSIS — Z5181 Encounter for therapeutic drug level monitoring: Secondary | ICD-10-CM | POA: Diagnosis not present

## 2016-03-10 LAB — POCT INR: INR: 3

## 2016-04-07 ENCOUNTER — Ambulatory Visit (INDEPENDENT_AMBULATORY_CARE_PROVIDER_SITE_OTHER): Payer: Medicare PPO | Admitting: *Deleted

## 2016-04-07 DIAGNOSIS — Z7901 Long term (current) use of anticoagulants: Secondary | ICD-10-CM | POA: Diagnosis not present

## 2016-04-07 DIAGNOSIS — I82409 Acute embolism and thrombosis of unspecified deep veins of unspecified lower extremity: Secondary | ICD-10-CM | POA: Diagnosis not present

## 2016-04-07 DIAGNOSIS — Z5181 Encounter for therapeutic drug level monitoring: Secondary | ICD-10-CM | POA: Diagnosis not present

## 2016-04-07 LAB — POCT INR: INR: 3.2

## 2016-04-07 MED ORDER — PRAVASTATIN SODIUM 20 MG PO TABS: 20.0000 mg | ORAL_TABLET | Freq: Every day | ORAL | 3 refills | Status: DC

## 2016-04-07 MED ORDER — NITROGLYCERIN 0.4 MG SL SUBL: 0.4000 mg | SUBLINGUAL_TABLET | SUBLINGUAL | 4 refills | Status: DC | PRN

## 2016-04-07 MED ORDER — LOSARTAN POTASSIUM 50 MG PO TABS: 50.0000 mg | ORAL_TABLET | Freq: Every day | ORAL | 3 refills | Status: DC

## 2016-04-07 MED ORDER — WARFARIN SODIUM 4 MG PO TABS: ORAL_TABLET | ORAL | 3 refills | Status: DC

## 2016-04-07 MED ORDER — METOPROLOL SUCCINATE ER 50 MG PO TB24: ORAL_TABLET | ORAL | 3 refills | Status: DC

## 2016-04-07 NOTE — Addendum Note (Signed)
Addended by: Malen Gauze on: 04/07/2016 03:29 PM   Modules accepted: Orders

## 2016-05-05 ENCOUNTER — Ambulatory Visit (INDEPENDENT_AMBULATORY_CARE_PROVIDER_SITE_OTHER): Payer: Medicare PPO | Admitting: *Deleted

## 2016-05-05 DIAGNOSIS — Z5181 Encounter for therapeutic drug level monitoring: Secondary | ICD-10-CM

## 2016-05-05 DIAGNOSIS — I82409 Acute embolism and thrombosis of unspecified deep veins of unspecified lower extremity: Secondary | ICD-10-CM

## 2016-05-05 DIAGNOSIS — Z7901 Long term (current) use of anticoagulants: Secondary | ICD-10-CM

## 2016-05-05 LAB — POCT INR: INR: 2.6

## 2016-06-02 ENCOUNTER — Ambulatory Visit (INDEPENDENT_AMBULATORY_CARE_PROVIDER_SITE_OTHER): Payer: Medicare PPO | Admitting: *Deleted

## 2016-06-02 DIAGNOSIS — Z7901 Long term (current) use of anticoagulants: Secondary | ICD-10-CM

## 2016-06-02 DIAGNOSIS — Z5181 Encounter for therapeutic drug level monitoring: Secondary | ICD-10-CM | POA: Diagnosis not present

## 2016-06-02 DIAGNOSIS — I82409 Acute embolism and thrombosis of unspecified deep veins of unspecified lower extremity: Secondary | ICD-10-CM

## 2016-06-02 LAB — POCT INR: INR: 2.3

## 2016-06-30 ENCOUNTER — Ambulatory Visit (INDEPENDENT_AMBULATORY_CARE_PROVIDER_SITE_OTHER): Payer: Medicare PPO | Admitting: *Deleted

## 2016-06-30 DIAGNOSIS — I82409 Acute embolism and thrombosis of unspecified deep veins of unspecified lower extremity: Secondary | ICD-10-CM | POA: Diagnosis not present

## 2016-06-30 DIAGNOSIS — Z5181 Encounter for therapeutic drug level monitoring: Secondary | ICD-10-CM

## 2016-06-30 DIAGNOSIS — Z7901 Long term (current) use of anticoagulants: Secondary | ICD-10-CM

## 2016-06-30 LAB — POCT INR
INR: 1.8
INR: 1.8

## 2016-07-26 ENCOUNTER — Encounter: Payer: Self-pay | Admitting: *Deleted

## 2016-08-04 ENCOUNTER — Ambulatory Visit (INDEPENDENT_AMBULATORY_CARE_PROVIDER_SITE_OTHER): Payer: Medicare PPO | Admitting: *Deleted

## 2016-08-04 DIAGNOSIS — Z5181 Encounter for therapeutic drug level monitoring: Secondary | ICD-10-CM | POA: Diagnosis not present

## 2016-08-04 DIAGNOSIS — I82409 Acute embolism and thrombosis of unspecified deep veins of unspecified lower extremity: Secondary | ICD-10-CM | POA: Diagnosis not present

## 2016-08-04 DIAGNOSIS — Z7901 Long term (current) use of anticoagulants: Secondary | ICD-10-CM | POA: Diagnosis not present

## 2016-08-04 LAB — POCT INR: INR: 2.6

## 2016-08-25 NOTE — Progress Notes (Signed)
Cardiology Office Note  Date: 08/26/2016   ID: Danielle Murphy, DOB Jun 03, 1940, MRN [id number]  PCP: Jani Gravel, MD  Primary Cardiologist: [name], MD   Chief Complaint  Patient presents with  . Coronary Artery Disease    History of Present Illness: Danielle Murphy is a 76 y.o. female last seen in January. She presents for a routine follow-up visit. Reports no angina symptoms at this time with typical activities. She is working part-time at [employer].  She continues on Coumadin with follow-up in the anticoagulation clinic, prior history of DVT and pulmonary embolus. Reports no bleeding problems.  We discussed obtaining a follow-up Myoview study. Last evaluation was in 2013.  Current cardiac regimen as outlined below, remains stable. She reports no intolerances.  I personally reviewed her ECG from today which shows normal sinus rhythm.  Past Medical History:  Diagnosis Date  . Aortic stenosis   . Coronary atherosclerosis of native coronary artery    BMS LAD 2002  . DVT (deep venous thrombosis) ([place])   . Essential hypertension, benign   . Hyperlipidemia   . Pericardial cyst    Status post excision 2002  . Pulmonary embolism [facility])     Past Surgical History:  Procedure Laterality Date  . Pericardial cyst excision  2002    Current Outpatient Prescriptions  Medication Sig Dispense Refill  . aspirin 81 MG tablet Take 81 mg by mouth every morning.     Marland Kitchen losartan (COZAAR) 50 MG tablet Take 1 tablet (50 mg total) by mouth daily. 90 tablet 3  . metoprolol succinate (TOPROL-XL) 50 MG 24 hr tablet TAKE 1 AND 1/2 TABLETS EVERY DAY 135 tablet 3  . pravastatin (PRAVACHOL) 20 MG tablet Take 1 tablet (20 mg total) by mouth daily. 90 tablet 3  . warfarin (COUMADIN) 4 MG tablet Take 1 tablet daily except 1/2 tablet on Tuesdays and Fridays 135 tablet 3   No current facility-administered medications for this visit.    Allergies:  Aspirin; Niacin; Ramipril; and Simvastatin   Social  History: The patient  reports that she has never smoked. She has never used smokeless tobacco. She reports that she does not drink alcohol or use drugs.   ROS:  Please see the history of present illness. Otherwise, complete review of systems is positive for none.  All other systems are reviewed and negative.   Physical Exam: VS:  BP (!) 152/78   Pulse 65   Ht 5' (1.524 m)   Wt 118 lb (53.5 kg)   SpO2 97%   BMI 23.05 kg/m , BMI Body mass index is 23.05 kg/m.  Wt Readings from Last 3 Encounters:  08/26/16 118 lb (53.5 kg)  02/24/16 120 lb (54.4 kg)  09/09/15 118 lb (53.5 kg)    Elderly woman,appears comfortable.  HEENT: Conjunctiva and lids are normal, oropharynx clear.  Neck: Supple no elevated jugular venous pressure or carotid bruits.  Lungs: Clear without labored breathing at rest.  Cardiac: Regular rate and rhythm with 9-5/1 systolic murmur at the base, preserved second heart sound. No S3 gallop or pericardial rub.  Abdomen: Soft and nontender. No hepatomegaly or bruits.  Extremities: No pitting edema. Distal pulses are 2+. Mild venous stasis.  Skin: Warm and dry.  Musko skeletal: No kyphosis.  Neuropsychiatric: Alert and oriented x3, affect appropriate.  ECG: I personally reviewed the tracing from 08/25/2015 which showed sinus bradycardia.  Recent Labwork: 09/09/2015: Hemoglobin 11.3   Other Studies Reviewed Today:  Echocardiogram 08/18/2015: Study  Conclusions  - Left ventricle: The cavity size was normal. Wall thickness was normal. Systolic function was normal. The estimated ejection fraction was in the range of 60% to 65%. Wall motion was normal; there were no regional wall motion abnormalities. Features are consistent with a pseudonormal left ventricular filling pattern, with concomitant abnormal relaxation and increased filling pressure (grade 2 diastolic dysfunction). Doppler parameters are consistent with high ventricular filling  pressure. - Aortic valve: Moderately calcified annulus. Trileaflet; mildly thickened, mildly calcified leaflets. There was mild to moderate stenosis. Peak velocity (S): 218 cm/s. Mean gradient (S): 10 mm Hg. Valve area (VTI): 1.17 cm^2. Valve area (Vmax): 1.27 cm^2. Valve area (Vmean): 1.32 cm^2. - Mitral valve: Calcified annulus. There was moderate regurgitation. - Left atrium: The atrium was mildly dilated. - Right atrium: The atrium was mildly dilated. - Tricuspid valve: There was mild-moderate regurgitation. - Pulmonary arteries: Systolic pressure was mildly increased. PA peak pressure: 40 mm Hg (S). - Systemic veins: IVC dilated with normal respiratory variation. Estimated CVP 8 mmHg.  Exercise Cardiolite 04/21/2011: IMPRESSION: Negative stress nuclear myocardial study revealing adequate exercise tolerance, a negative stress EKG, normal left ventricular size and normal left ventricular systolic function. By scintigraphic imaging, there was physiologic apical thinning without convincing evidence for ischemia or infarction. Other findings as noted.  Assessment and Plan:  1. CAD status post BMS to the LAD in 2002. Last ischemic evaluation was in 2013. We will schedule a Lexiscan Myoview on medical therapy for reevaluation.  2. Mild to moderate aortic stenosis by echocardiogram last year. No change on examination. Continue observation.  3. Essential hypertension, blood pressure elevated today. She reports compliance with current regimen. Keep follow-up with Dr. Maudie Mercury.  4. History of DVT and pulmonary embolus. She remains on chronic Coumadin therapy with follow-up in the at the coagulation clinic.  Current medicines were reviewed with the patient today.   Orders Placed This Encounter  Procedures  . NM Myocar Multi W/Spect W/Wall Motion / EF  . EKG 12-Lead    Disposition: Follow-up in 6 months.  Signed, Satira Sark, MD, [facility] 08/26/2016 8:59 AM      [place] at [facility] [address], [place], [place] [address] Phone: [phone]; Fax: [phone]

## 2016-08-26 ENCOUNTER — Encounter: Payer: Self-pay | Admitting: Cardiology

## 2016-08-26 ENCOUNTER — Ambulatory Visit (INDEPENDENT_AMBULATORY_CARE_PROVIDER_SITE_OTHER): Payer: Medicare PPO | Admitting: Cardiology

## 2016-08-26 VITALS — BP 152/78 | HR 65 | Ht 60.0 in | Wt 118.0 lb

## 2016-08-26 DIAGNOSIS — I350 Nonrheumatic aortic (valve) stenosis: Secondary | ICD-10-CM | POA: Diagnosis not present

## 2016-08-26 DIAGNOSIS — I10 Essential (primary) hypertension: Secondary | ICD-10-CM | POA: Diagnosis not present

## 2016-08-26 DIAGNOSIS — Z86718 Personal history of other venous thrombosis and embolism: Secondary | ICD-10-CM

## 2016-08-26 DIAGNOSIS — I2510 Atherosclerotic heart disease of native coronary artery without angina pectoris: Secondary | ICD-10-CM

## 2016-08-26 NOTE — Patient Instructions (Signed)
Your physician wants you to follow-up in: 6 months with Dr.McDowell You will receive a reminder letter in the mail two months in advance. If you don't receive a letter, please call our office to schedule the follow-up appointment.    Your physician recommends that you continue on your current medications as directed. Please refer to the Current Medication list given to you today.   Your physician has requested that you have a lexiscan myoview. For further information please visit [email or web address]. Please follow instruction sheet, as given.        Thank you for choosing [facility] !        

## 2016-09-01 ENCOUNTER — Ambulatory Visit (INDEPENDENT_AMBULATORY_CARE_PROVIDER_SITE_OTHER): Payer: Medicare PPO | Admitting: *Deleted

## 2016-09-01 DIAGNOSIS — Z5181 Encounter for therapeutic drug level monitoring: Secondary | ICD-10-CM

## 2016-09-01 DIAGNOSIS — I82409 Acute embolism and thrombosis of unspecified deep veins of unspecified lower extremity: Secondary | ICD-10-CM | POA: Diagnosis not present

## 2016-09-01 DIAGNOSIS — Z7901 Long term (current) use of anticoagulants: Secondary | ICD-10-CM | POA: Diagnosis not present

## 2016-09-01 LAB — POCT INR: INR: 2.6

## 2016-09-29 ENCOUNTER — Ambulatory Visit (INDEPENDENT_AMBULATORY_CARE_PROVIDER_SITE_OTHER): Payer: Medicare PPO | Admitting: *Deleted

## 2016-09-29 DIAGNOSIS — Z5181 Encounter for therapeutic drug level monitoring: Secondary | ICD-10-CM

## 2016-09-29 DIAGNOSIS — I82409 Acute embolism and thrombosis of unspecified deep veins of unspecified lower extremity: Secondary | ICD-10-CM | POA: Diagnosis not present

## 2016-09-29 DIAGNOSIS — Z7901 Long term (current) use of anticoagulants: Secondary | ICD-10-CM | POA: Diagnosis not present

## 2016-09-29 LAB — POCT INR: INR: 2.4

## 2016-09-30 ENCOUNTER — Encounter (HOSPITAL_BASED_OUTPATIENT_CLINIC_OR_DEPARTMENT_OTHER)
Admission: RE | Admit: 2016-09-30 | Discharge: 2016-09-30 | Disposition: A | Discharge status: Home / Self Care | Payer: Medicare PPO | Source: Ambulatory Visit | Attending: Cardiology | Admitting: Cardiology

## 2016-09-30 ENCOUNTER — Encounter (HOSPITAL_COMMUNITY)
Admission: RE | Admit: 2016-09-30 | Discharge: 2016-09-30 | Disposition: A | Discharge status: Home / Self Care | Payer: Medicare PPO | Source: Ambulatory Visit | Attending: Cardiology | Admitting: Cardiology

## 2016-09-30 ENCOUNTER — Encounter (HOSPITAL_COMMUNITY): Payer: Self-pay

## 2016-09-30 DIAGNOSIS — I2510 Atherosclerotic heart disease of native coronary artery without angina pectoris: Secondary | ICD-10-CM | POA: Diagnosis present

## 2016-09-30 LAB — NM MYOCAR MULTI W/SPECT W/WALL MOTION / EF
CHL CUP NUCLEAR SDS: 3
CHL CUP NUCLEAR SRS: 1
CSEPPHR: 88 {beats}/min
LV dias vol: 64 mL (ref 46–106)
LV sys vol: 14 mL
RATE: 0.42
Rest HR: 54 {beats}/min
SSS: 4
TID: 1.03

## 2016-09-30 MED ORDER — SODIUM CHLORIDE 0.9% FLUSH
INTRAVENOUS | Status: AC
  Administered 2016-09-30: 10 mL via INTRAVENOUS
  Filled 2016-09-30: qty 10

## 2016-09-30 MED ORDER — REGADENOSON 0.4 MG/5ML IV SOLN
INTRAVENOUS | Status: AC
  Administered 2016-09-30: 0.4 mg via INTRAVENOUS
  Filled 2016-09-30: qty 5

## 2016-09-30 MED ORDER — TECHNETIUM TC 99M TETROFOSMIN IV KIT
10.0000 | PACK | Freq: Once | INTRAVENOUS | Status: AC | PRN
  Administered 2016-09-30: 10 via INTRAVENOUS

## 2016-09-30 MED ORDER — TECHNETIUM TC 99M TETROFOSMIN IV KIT
30.0000 | PACK | Freq: Once | INTRAVENOUS | Status: AC | PRN
  Administered 2016-09-30: 30 via INTRAVENOUS

## 2016-11-10 ENCOUNTER — Ambulatory Visit (INDEPENDENT_AMBULATORY_CARE_PROVIDER_SITE_OTHER): Payer: Medicare PPO | Admitting: *Deleted

## 2016-11-10 DIAGNOSIS — Z7901 Long term (current) use of anticoagulants: Secondary | ICD-10-CM

## 2016-11-10 DIAGNOSIS — Z5181 Encounter for therapeutic drug level monitoring: Secondary | ICD-10-CM | POA: Diagnosis not present

## 2016-11-10 DIAGNOSIS — I82409 Acute embolism and thrombosis of unspecified deep veins of unspecified lower extremity: Secondary | ICD-10-CM | POA: Diagnosis not present

## 2016-11-10 LAB — POCT INR: INR: 2.9

## 2016-12-22 ENCOUNTER — Ambulatory Visit (INDEPENDENT_AMBULATORY_CARE_PROVIDER_SITE_OTHER): Payer: Medicare PPO | Admitting: *Deleted

## 2016-12-22 DIAGNOSIS — Z5181 Encounter for therapeutic drug level monitoring: Secondary | ICD-10-CM | POA: Diagnosis not present

## 2016-12-22 DIAGNOSIS — I4891 Unspecified atrial fibrillation: Secondary | ICD-10-CM | POA: Diagnosis not present

## 2016-12-22 LAB — POCT INR: INR: 2.5

## 2017-02-02 ENCOUNTER — Ambulatory Visit (INDEPENDENT_AMBULATORY_CARE_PROVIDER_SITE_OTHER): Payer: Medicare PPO | Admitting: *Deleted

## 2017-02-02 DIAGNOSIS — Z5181 Encounter for therapeutic drug level monitoring: Secondary | ICD-10-CM | POA: Diagnosis not present

## 2017-02-02 DIAGNOSIS — I4891 Unspecified atrial fibrillation: Secondary | ICD-10-CM

## 2017-02-02 LAB — POCT INR: INR: 2.9

## 2017-02-02 NOTE — Patient Instructions (Signed)
Continue coumadin 1 tablet daily except 1/2 tablet on Mondays and Fridays Continue greens/salads Recheck in 6 weeks 

## 2017-02-08 ENCOUNTER — Other Ambulatory Visit: Payer: Self-pay | Admitting: Cardiology

## 2017-02-15 NOTE — Progress Notes (Signed)
Cardiology Office Note  Date: 02/17/2017   ID: Danielle Murphy, DOB [birth year], MRN [id number]  PCP: Jani Gravel, MD  Primary Cardiologist: [name], MD   Chief Complaint  Patient presents with  . Coronary Artery Disease    History of Present Illness: Danielle Murphy is a 77 y.o. female last seen in August 2018. She presents for a routine follow-up visit.since last assessment she does not report any significant angina symptoms, stable NYHA class II dyspnea. She prefers to stay active as much as she can. She is working part-time at [employer] on [employer].  She continues to follow in the anticoagulation clinic on Coumadin with prior history of DVT and pulmonary embolus. She does not report any spontaneous bleeding problems.  I reviewed her medications which are stable from a cardiac perspective and outlined below.  Follow-up Myoview in September 2018 was overall low risk as outlined below.  Past Medical History:  Diagnosis Date  . Aortic stenosis   . Coronary atherosclerosis of native coronary artery    BMS LAD 2002  . DVT (deep venous thrombosis) ([place])   . Essential hypertension, benign   . Hyperlipidemia   . Pericardial cyst    Status post excision 2002  . Pulmonary embolism [facility])     Past Surgical History:  Procedure Laterality Date  . Pericardial cyst excision  2002    Current Outpatient Medications  Medication Sig Dispense Refill  . aspirin 81 MG tablet Take 81 mg by mouth every morning.     Marland Kitchen losartan (COZAAR) 50 MG tablet TAKE 1 TABLET EVERY DAY 90 tablet 3  . metoprolol succinate (TOPROL-XL) 50 MG 24 hr tablet TAKE 1 AND 1/2 TABLETS EVERY DAY 135 tablet 3  . pravastatin (PRAVACHOL) 20 MG tablet TAKE 1 TABLET EVERY DAY 90 tablet 3  . warfarin (COUMADIN) 4 MG tablet Take 1 tablet daily except 1/2 tablet on Tuesdays and Fridays 135 tablet 3   No current facility-administered medications for this visit.    Allergies:  Aspirin; Niacin; Ramipril; and Simvastatin     Social History: The patient  reports that  has never smoked. she has never used smokeless tobacco. She reports that she does not drink alcohol or use drugs.   ROS:  Please see the history of present illness. Otherwise, complete review of systems is positive for none.  All other systems are reviewed and negative.   Physical Exam: VS:  BP (!) 156/70 (BP Location: Left Arm)   Pulse 70   Ht 5' (1.524 m)   Wt 125 lb (56.7 kg)   SpO2 92%   BMI 24.41 kg/m , BMI Body mass index is 24.41 kg/m.  Wt Readings from Last 3 Encounters:  02/17/17 125 lb (56.7 kg)  08/26/16 118 lb (53.5 kg)  02/24/16 120 lb (54.4 kg)    General: Elderly woman, appears comfortable at rest. HEENT: Conjunctiva and lids normal, oropharynx clear. Neck: Supple, no elevated JVP or carotid bruits, no thyromegaly. Lungs: Clear to auscultation, nonlabored breathing at rest. Cardiac: Regular rate and rhythm, no S3, 5-6/4 systolic murmur, no pericardial rub. Abdomen: Soft, nontender, bowel sounds present, no guarding or rebound. Extremities: Mild venous stasis, distal pulses 2+. Skin: Warm and dry. Musculoskeletal: No kyphosis. Neuropsychiatric: Alert and oriented x3, affect grossly appropriate.  ECG: I personally reviewed the tracing from 08/26/2016 which showed normal sinus rhythm.  Recent Labwork:   Other Studies Reviewed Today:  Echocardiogram 08/18/2015: Study Conclusions  - Left ventricle: The cavity  size was normal. Wall thickness was   normal. Systolic function was normal. The estimated ejection   fraction was in the range of 60% to 65%. Wall motion was normal;   there were no regional wall motion abnormalities. Features are   consistent with a pseudonormal left ventricular filling pattern,   with concomitant abnormal relaxation and increased filling   pressure (grade 2 diastolic dysfunction). Doppler parameters are   consistent with high ventricular filling pressure. - Aortic valve: Moderately calcified  annulus. Trileaflet; mildly   thickened, mildly calcified leaflets. There was mild to moderate   stenosis. Peak velocity (S): 218 cm/s. Mean gradient (S): 10 mm   Hg. Valve area (VTI): 1.17 cm^2. Valve area (Vmax): 1.27 cm^2.   Valve area (Vmean): 1.32 cm^2. - Mitral valve: Calcified annulus. There was moderate   regurgitation. - Left atrium: The atrium was mildly dilated. - Right atrium: The atrium was mildly dilated. - Tricuspid valve: There was mild-moderate regurgitation. - Pulmonary arteries: Systolic pressure was mildly increased. PA   peak pressure: 40 mm Hg (S). - Systemic veins: IVC dilated with normal respiratory variation.   Estimated CVP 8 mmHg.  Lexiscan Myoview 09/30/2016:  There was no ST segment deviation noted during stress.  This is a low risk study.  The left ventricular ejection fraction is hyperdynamic (>65%).  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  Assessment and Plan:  1. Symptomatically stable CAD status post BMS to the LAD in 2002. Ischemic testing from September 2018 was low risk. Continue with medical therapy and observation.  2. Mild to moderate aortic stenosis, no change on examination. Last echocardiogram was in 2017.  3. Essential hypertension, continue Cozaar and Toprol-XL.  4. History of DVT and pulmonary embolus, remains on chronic Coumadin with follow-up in the anticoagulation clinic.  Current medicines were reviewed with the patient today.  Disposition: Follow-up in 6 months.  Signed, Satira Sark, MD, [facility] 02/17/2017 10:05 AM    [place] at [place]. [address], [place], [place] [address] Phone: [phone]; Fax: [phone]

## 2017-02-17 ENCOUNTER — Encounter: Payer: Self-pay | Admitting: Cardiology

## 2017-02-17 ENCOUNTER — Ambulatory Visit: Payer: Medicare PPO | Admitting: Cardiology

## 2017-02-17 VITALS — BP 156/70 | HR 70 | Ht 60.0 in | Wt 125.0 lb

## 2017-02-17 DIAGNOSIS — I10 Essential (primary) hypertension: Secondary | ICD-10-CM | POA: Diagnosis not present

## 2017-02-17 DIAGNOSIS — I2510 Atherosclerotic heart disease of native coronary artery without angina pectoris: Secondary | ICD-10-CM

## 2017-02-17 DIAGNOSIS — Z86718 Personal history of other venous thrombosis and embolism: Secondary | ICD-10-CM | POA: Diagnosis not present

## 2017-02-17 DIAGNOSIS — I350 Nonrheumatic aortic (valve) stenosis: Secondary | ICD-10-CM | POA: Diagnosis not present

## 2017-02-17 NOTE — Patient Instructions (Signed)
Your physician wants you to follow-up in: 6 months with Dr.McDowell You will receive a reminder letter in the mail two months in advance. If you don't receive a letter, please call our office to schedule the follow-up appointment.    Your physician recommends that you continue on your current medications as directed. Please refer to the Current Medication list given to you today.     If you need a refill on your cardiac medications before your next appointment, please call your pharmacy.     No testing ordered today.    Thank you for choosing [facility] !         

## 2017-03-16 ENCOUNTER — Ambulatory Visit (INDEPENDENT_AMBULATORY_CARE_PROVIDER_SITE_OTHER): Payer: Medicare PPO | Admitting: *Deleted

## 2017-03-16 DIAGNOSIS — I4891 Unspecified atrial fibrillation: Secondary | ICD-10-CM | POA: Diagnosis not present

## 2017-03-16 DIAGNOSIS — Z7901 Long term (current) use of anticoagulants: Secondary | ICD-10-CM | POA: Diagnosis not present

## 2017-03-16 DIAGNOSIS — Z5181 Encounter for therapeutic drug level monitoring: Secondary | ICD-10-CM | POA: Diagnosis not present

## 2017-03-16 LAB — POCT INR: INR: 2.6

## 2017-03-16 NOTE — Patient Instructions (Signed)
Continue coumadin 1 tablet daily except 1/2 tablet on Mondays and Fridays Continue greens/salads Recheck in 6 weeks 

## 2017-04-21 ENCOUNTER — Other Ambulatory Visit: Payer: Self-pay | Admitting: Cardiology

## 2017-04-27 ENCOUNTER — Ambulatory Visit (INDEPENDENT_AMBULATORY_CARE_PROVIDER_SITE_OTHER): Payer: Medicare PPO | Admitting: *Deleted

## 2017-04-27 DIAGNOSIS — Z5181 Encounter for therapeutic drug level monitoring: Secondary | ICD-10-CM

## 2017-04-27 DIAGNOSIS — I4891 Unspecified atrial fibrillation: Secondary | ICD-10-CM | POA: Diagnosis not present

## 2017-04-27 LAB — POCT INR: INR: 2.5

## 2017-04-27 NOTE — Patient Instructions (Signed)
Continue coumadin 1 tablet daily except 1/2 tablet on Mondays and Fridays Continue greens/salads Recheck in 6 weeks 

## 2017-05-20 ENCOUNTER — Emergency Department (HOSPITAL_COMMUNITY)
Admission: EM | Admit: 2017-05-20 | Discharge: 2017-05-20 | Disposition: A | Discharge status: Home / Self Care | Payer: Medicare PPO | Attending: Emergency Medicine | Admitting: Emergency Medicine

## 2017-05-20 ENCOUNTER — Encounter (HOSPITAL_COMMUNITY): Payer: Self-pay

## 2017-05-20 ENCOUNTER — Emergency Department (HOSPITAL_COMMUNITY): Payer: Medicare PPO

## 2017-05-20 DIAGNOSIS — I2510 Atherosclerotic heart disease of native coronary artery without angina pectoris: Secondary | ICD-10-CM | POA: Diagnosis not present

## 2017-05-20 DIAGNOSIS — I10 Essential (primary) hypertension: Secondary | ICD-10-CM | POA: Diagnosis not present

## 2017-05-20 DIAGNOSIS — Z86718 Personal history of other venous thrombosis and embolism: Secondary | ICD-10-CM | POA: Insufficient documentation

## 2017-05-20 DIAGNOSIS — Z7901 Long term (current) use of anticoagulants: Secondary | ICD-10-CM | POA: Diagnosis not present

## 2017-05-20 DIAGNOSIS — M79605 Pain in left leg: Secondary | ICD-10-CM | POA: Diagnosis not present

## 2017-05-20 DIAGNOSIS — Z86711 Personal history of pulmonary embolism: Secondary | ICD-10-CM | POA: Diagnosis not present

## 2017-05-20 DIAGNOSIS — Z7982 Long term (current) use of aspirin: Secondary | ICD-10-CM | POA: Insufficient documentation

## 2017-05-20 DIAGNOSIS — Z79899 Other long term (current) drug therapy: Secondary | ICD-10-CM | POA: Insufficient documentation

## 2017-05-20 NOTE — Discharge Instructions (Addendum)
Follow-up with your primary doctor in the next 2 to 3 days, and return to the ER if symptoms significantly worsen or change.
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
## 2017-05-20 NOTE — ED Triage Notes (Signed)
Pt states her left lower leg started hurting yesterday afternoon, states is concerned because she had a blood clot in that leg previously.  Pt is currently on Coumadin and aspirin.

## 2017-06-08 ENCOUNTER — Ambulatory Visit (INDEPENDENT_AMBULATORY_CARE_PROVIDER_SITE_OTHER): Payer: Medicare PPO | Admitting: *Deleted

## 2017-06-08 DIAGNOSIS — Z5181 Encounter for therapeutic drug level monitoring: Secondary | ICD-10-CM | POA: Diagnosis not present

## 2017-06-08 DIAGNOSIS — I4891 Unspecified atrial fibrillation: Secondary | ICD-10-CM

## 2017-06-08 LAB — POCT INR: INR: 2.6

## 2017-06-08 NOTE — Patient Instructions (Signed)
Continue coumadin 1 tablet daily except 1/2 tablet on Mondays and Fridays Continue greens/salads Recheck in 6 weeks 

## 2017-06-09 ENCOUNTER — Other Ambulatory Visit (HOSPITAL_COMMUNITY): Payer: Self-pay | Admitting: Family Medicine

## 2017-06-09 DIAGNOSIS — M79605 Pain in left leg: Secondary | ICD-10-CM

## 2017-06-09 DIAGNOSIS — Z86718 Personal history of other venous thrombosis and embolism: Secondary | ICD-10-CM

## 2017-06-13 ENCOUNTER — Ambulatory Visit (HOSPITAL_COMMUNITY)
Admission: RE | Admit: 2017-06-13 | Discharge: 2017-06-13 | Disposition: A | Discharge status: Home / Self Care | Payer: Medicare PPO | Source: Ambulatory Visit | Attending: Family Medicine | Admitting: Family Medicine

## 2017-06-13 DIAGNOSIS — M79605 Pain in left leg: Secondary | ICD-10-CM | POA: Insufficient documentation

## 2017-06-13 DIAGNOSIS — Z86718 Personal history of other venous thrombosis and embolism: Secondary | ICD-10-CM | POA: Insufficient documentation

## 2017-06-22 ENCOUNTER — Other Ambulatory Visit (HOSPITAL_COMMUNITY): Payer: Self-pay | Admitting: Family Medicine

## 2017-06-22 ENCOUNTER — Ambulatory Visit (HOSPITAL_COMMUNITY)
Admission: RE | Admit: 2017-06-22 | Discharge: 2017-06-22 | Disposition: A | Discharge status: Home / Self Care | Payer: Medicare PPO | Source: Ambulatory Visit | Attending: Family Medicine | Admitting: Family Medicine

## 2017-06-22 DIAGNOSIS — I700 Atherosclerosis of aorta: Secondary | ICD-10-CM | POA: Insufficient documentation

## 2017-06-22 DIAGNOSIS — Z122 Encounter for screening for malignant neoplasm of respiratory organs: Secondary | ICD-10-CM

## 2017-06-22 DIAGNOSIS — J439 Emphysema, unspecified: Secondary | ICD-10-CM | POA: Insufficient documentation

## 2017-06-22 DIAGNOSIS — Z1231 Encounter for screening mammogram for malignant neoplasm of breast: Secondary | ICD-10-CM

## 2017-06-29 ENCOUNTER — Ambulatory Visit (HOSPITAL_COMMUNITY)
Admission: RE | Admit: 2017-06-29 | Discharge: 2017-06-29 | Disposition: A | Discharge status: Home / Self Care | Payer: Medicare PPO | Source: Ambulatory Visit | Attending: Family Medicine | Admitting: Family Medicine

## 2017-06-29 ENCOUNTER — Encounter (HOSPITAL_COMMUNITY): Payer: Self-pay

## 2017-06-29 DIAGNOSIS — Z1231 Encounter for screening mammogram for malignant neoplasm of breast: Secondary | ICD-10-CM | POA: Diagnosis present

## 2017-07-04 ENCOUNTER — Other Ambulatory Visit (HOSPITAL_COMMUNITY): Payer: Self-pay | Admitting: Family Medicine

## 2017-07-04 DIAGNOSIS — Z136 Encounter for screening for cardiovascular disorders: Secondary | ICD-10-CM

## 2017-07-20 ENCOUNTER — Ambulatory Visit (INDEPENDENT_AMBULATORY_CARE_PROVIDER_SITE_OTHER): Payer: Medicare PPO | Admitting: *Deleted

## 2017-07-20 DIAGNOSIS — I4891 Unspecified atrial fibrillation: Secondary | ICD-10-CM

## 2017-07-20 DIAGNOSIS — Z5181 Encounter for therapeutic drug level monitoring: Secondary | ICD-10-CM | POA: Diagnosis not present

## 2017-07-20 LAB — POCT INR: INR: 2.4 (ref 2.0–3.0)

## 2017-07-20 NOTE — Patient Instructions (Signed)
Continue coumadin 1 tablet daily except 1/2 tablet on Mondays and Fridays Continue greens/salads Recheck in 6 weeks

## 2017-08-22 ENCOUNTER — Emergency Department (HOSPITAL_COMMUNITY): Payer: Medicare PPO

## 2017-08-22 ENCOUNTER — Encounter (HOSPITAL_COMMUNITY): Payer: Self-pay

## 2017-08-22 ENCOUNTER — Other Ambulatory Visit: Payer: Self-pay

## 2017-08-22 ENCOUNTER — Emergency Department (HOSPITAL_COMMUNITY)
Admission: EM | Admit: 2017-08-22 | Discharge: 2017-08-22 | Disposition: A | Discharge status: Home / Self Care | Payer: Medicare PPO | Attending: Emergency Medicine | Admitting: Emergency Medicine

## 2017-08-22 DIAGNOSIS — R1031 Right lower quadrant pain: Secondary | ICD-10-CM | POA: Insufficient documentation

## 2017-08-22 DIAGNOSIS — W0110XA Fall on same level from slipping, tripping and stumbling with subsequent striking against unspecified object, initial encounter: Secondary | ICD-10-CM | POA: Diagnosis not present

## 2017-08-22 DIAGNOSIS — Y999 Unspecified external cause status: Secondary | ICD-10-CM | POA: Diagnosis not present

## 2017-08-22 DIAGNOSIS — Z79899 Other long term (current) drug therapy: Secondary | ICD-10-CM | POA: Diagnosis not present

## 2017-08-22 DIAGNOSIS — S20211A Contusion of right front wall of thorax, initial encounter: Secondary | ICD-10-CM

## 2017-08-22 DIAGNOSIS — S299XXA Unspecified injury of thorax, initial encounter: Secondary | ICD-10-CM | POA: Diagnosis present

## 2017-08-22 DIAGNOSIS — Z7982 Long term (current) use of aspirin: Secondary | ICD-10-CM | POA: Diagnosis not present

## 2017-08-22 DIAGNOSIS — S298XXA Other specified injuries of thorax, initial encounter: Secondary | ICD-10-CM

## 2017-08-22 DIAGNOSIS — W19XXXA Unspecified fall, initial encounter: Secondary | ICD-10-CM

## 2017-08-22 DIAGNOSIS — Z7901 Long term (current) use of anticoagulants: Secondary | ICD-10-CM | POA: Diagnosis not present

## 2017-08-22 DIAGNOSIS — M25551 Pain in right hip: Secondary | ICD-10-CM

## 2017-08-22 DIAGNOSIS — Y929 Unspecified place or not applicable: Secondary | ICD-10-CM | POA: Diagnosis not present

## 2017-08-22 DIAGNOSIS — Y939 Activity, unspecified: Secondary | ICD-10-CM | POA: Diagnosis not present

## 2017-08-22 DIAGNOSIS — I2510 Atherosclerotic heart disease of native coronary artery without angina pectoris: Secondary | ICD-10-CM | POA: Diagnosis not present

## 2017-08-22 DIAGNOSIS — I10 Essential (primary) hypertension: Secondary | ICD-10-CM | POA: Diagnosis not present

## 2017-08-22 LAB — CBC
HCT: 32.6 % — ABNORMAL LOW (ref 36.0–46.0)
HEMOGLOBIN: 10.6 g/dL — AB (ref 12.0–15.0)
MCH: 30.5 pg (ref 26.0–34.0)
MCHC: 32.5 g/dL (ref 30.0–36.0)
MCV: 93.7 fL (ref 78.0–100.0)
Platelets: 158 10*3/uL (ref 150–400)
RBC: 3.48 MIL/uL — AB (ref 3.87–5.11)
RDW: 12.6 % (ref 11.5–15.5)
WBC: 6.3 10*3/uL (ref 4.0–10.5)

## 2017-08-22 LAB — BASIC METABOLIC PANEL
Anion gap: 8 (ref 5–15)
BUN: 16 mg/dL (ref 8–23)
CO2: 26 mmol/L (ref 22–32)
Calcium: 8.8 mg/dL — ABNORMAL LOW (ref 8.9–10.3)
Chloride: 104 mmol/L (ref 98–111)
Creatinine, Ser: 0.98 mg/dL (ref 0.44–1.00)
GFR calc non Af Amer: 54 mL/min — ABNORMAL LOW (ref >60)
Glucose, Bld: 112 mg/dL — ABNORMAL HIGH (ref 70–99)
POTASSIUM: 3.9 mmol/L (ref 3.5–5.1)
SODIUM: 138 mmol/L (ref 135–145)

## 2017-08-22 LAB — PROTIME-INR
INR: 3.42
PROTHROMBIN TIME: 34.2 s — AB (ref 11.4–15.2)

## 2017-08-22 MED ORDER — HYDROCODONE-ACETAMINOPHEN 5-325 MG PO TABS: 1.0000 | ORAL_TABLET | ORAL | 0 refills | Status: DC | PRN

## 2017-08-22 MED ORDER — MORPHINE SULFATE (PF) 4 MG/ML IV SOLN
4.0000 mg | Freq: Once | INTRAVENOUS | Status: AC
  Administered 2017-08-22: 4 mg via INTRAVENOUS
  Filled 2017-08-22: qty 1

## 2017-08-22 MED ORDER — FENTANYL CITRATE (PF) 100 MCG/2ML IJ SOLN
50.0000 ug | INTRAMUSCULAR | Status: DC | PRN
  Administered 2017-08-22: 50 ug via INTRAVENOUS
  Filled 2017-08-22: qty 2
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
## 2017-08-22 NOTE — Discharge Instructions (Signed)
Follow-up closely with your primary doctor and orthopedics. Home health will assist you through this process. For severe pain take norco or vicodin however realize they have the potential for addiction and it can make you sleepy and has tylenol in it.  No operating machinery while taking.  If you were given medicines take as directed.  If you are on coumadin or contraceptives realize their levels and effectiveness is altered by many different medicines.  If you have any reaction (rash, tongues swelling, other) to the medicines stop taking and see a physician.    If your blood pressure was elevated in the ER make sure you follow up for management with a primary doctor or return for chest pain, shortness of breath or stroke symptoms.  Please follow up as directed and return to the ER or see a physician for new or worsening symptoms.  Thank you. Vitals:   08/22/17 0926 08/22/17 1100 08/22/17 1330 08/22/17 1338  BP: (!) 184/95 (!) 139/55 (!) 139/51   Pulse: 75 69 71   Resp: 18 16 18    Temp: 98.2 F (36.8 C)     TempSrc: Oral     SpO2: 97% 95% (!) 88% 90%  Weight:

## 2017-08-22 NOTE — ED Notes (Signed)
Patient maintaining oxygen saturations of 94 percent on room air.

## 2017-08-22 NOTE — ED Notes (Signed)
PT at bedside.
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
## 2017-08-22 NOTE — ED Triage Notes (Signed)
Pt fell yesterday while walking. States she tripped. Has right sided hip pain. Was able to walk yesterday, but is not able to bear weight today. Pt has bruising to right arm. Is alert and oriented. Did not hit head, but is on Warfarin.

## 2017-08-22 NOTE — ED Provider Notes (Signed)
[facility] EMERGENCY DEPARTMENT Provider Note   CSN: [id number] Arrival date & time: 08/22/17  5631     History   Chief Complaint Chief Complaint  Patient presents with  . Fall    HPI Danielle Murphy is a 77 y.o. female.  Patient with history of high blood pressure, aortic stenosis, DVT on Coumadin compliant with medications presents after mechanical fall.  Patient tripped and fell on her right hip and right ribs.  Pain with palpation range of motion.  No head injury.  Patient denies syncope.  No smoking history.     Past Medical History:  Diagnosis Date  . Aortic stenosis   . Coronary atherosclerosis of native coronary artery    BMS LAD 2002  . DVT (deep venous thrombosis) ([place])   . Essential hypertension, benign   . Hyperlipidemia   . Pericardial cyst    Status post excision 2002  . Pulmonary embolism [facility])     Patient Active Problem List   Diagnosis Date Noted  . Encounter for therapeutic drug monitoring 03/08/2013  . Pulmonary embolus ([place]) 10/14/2010  . Long term current use of anticoagulant 04/21/2010  . CORONARY ATHEROSCLEROSIS NATIVE CORONARY ARTERY 09/03/2009  . Aortic valve disorders 07/24/2008  . 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 DYSLIPIDEMIA 07/22/2008  . HYPERTENSION 07/22/2008  . DVT 07/22/2008    Past Surgical History:  Procedure Laterality Date  . Pericardial cyst excision  2002     OB History   None      Home Medications    Prior to Admission medications   Medication Sig Start Date End Date Taking? Authorizing Provider  acetaminophen (TYLENOL) 500 MG tablet Take 500 mg by mouth every 6 (six) hours as needed.   Yes [provider]  aspirin 81 MG tablet Take 81 mg by mouth every morning.    Yes [provider]  ciclopirox (PENLAC) 8 % solution Apply 1 application topically daily as needed. 06/15/17  Yes [provider]  losartan (COZAAR) 50 MG tablet TAKE 1 TABLET EVERY DAY 02/08/17   Yes Satira Sark, MD  metoprolol succinate (TOPROL-XL) 50 MG 24 hr tablet TAKE 1 AND 1/2 TABLETS EVERY DAY 02/08/17  Yes Satira Sark, MD  pravastatin (PRAVACHOL) 20 MG tablet TAKE 1 TABLET EVERY DAY 02/08/17  Yes Satira Sark, MD  warfarin (COUMADIN) 4 MG tablet TAKE 1 TABLET EVERY DAY  EXCEPT TAKE 1/2 TABLET  ON  TUESDAYS  AND  FRIDAYS 04/21/17  Yes Satira Sark, MD  HYDROcodone-acetaminophen (NORCO) 5-325 MG tablet Take 1 tablet by mouth every 4 (four) hours as needed. 08/22/17   [name], MD    Family History Family History  Problem Relation Age of Onset  . Stroke Mother   . Cancer Father     Social History Social History   Tobacco Use  . Smoking status: Never Smoker  . Smokeless tobacco: Never Used  Substance Use Topics  . Alcohol use: No    Alcohol/week: 0.0 oz  . Drug use: No     Allergies   Aspirin; Niacin; Ramipril; and Simvastatin   Review of Systems Review of Systems  Constitutional: Negative for chills and fever.  HENT: Negative for congestion.   Eyes: Negative for visual disturbance.  Respiratory: Negative for shortness of breath.   Cardiovascular: Negative for chest pain.  Gastrointestinal: Negative for abdominal pain and vomiting.  Genitourinary: Negative for dysuria and flank pain.  Musculoskeletal: Positive for arthralgias  and gait problem. Negative for back pain, neck pain and neck stiffness.  Skin: Negative for rash.  Neurological: Negative for light-headedness and headaches.     Physical Exam Updated Vital Signs BP (!) 139/51   Pulse 71   Temp 98.2 F (36.8 C) (Oral)   Resp 18   Wt 53.5 kg (118 lb)   SpO2 90%   BMI 23.05 kg/m   Physical Exam  Constitutional: She is oriented to person, place, and time. She appears well-developed and well-nourished.  HENT:  Head: Normocephalic and atraumatic.  Eyes: Conjunctivae are normal. Right eye exhibits no discharge. Left eye exhibits no discharge.  Neck: Normal range of  motion. Neck supple. No tracheal deviation present.  Cardiovascular: Normal rate and regular rhythm.  Pulmonary/Chest: Effort normal and breath sounds normal.  Abdominal: Soft. She exhibits no distension. There is no tenderness. There is no guarding.  Musculoskeletal: She exhibits tenderness. She exhibits no edema.  Patient has tenderness to palpation lateral anterior right hip.  Pain with flexion.  Patient does have ecchymosis to the right lateral tibia however this is from previous injury no tenderness to bones on palpation.  Compartments soft.  Neurological: She is alert and oriented to person, place, and time. No cranial nerve deficit.  Skin: Skin is warm. No rash noted.  Psychiatric: She has a normal mood and affect.  Nursing note and vitals reviewed.    ED Treatments / Results  Labs (all labs ordered are listed, but only abnormal results are displayed) Labs Reviewed  CBC - Abnormal; Notable for the following components:      Result Value   RBC 3.48 (*)    Hemoglobin 10.6 (*)    HCT 32.6 (*)    All other components within normal limits  BASIC METABOLIC PANEL - Abnormal; Notable for the following components:   Glucose, Bld 112 (*)    Calcium 8.8 (*)    GFR calc non Af Amer 54 (*)    All other components within normal limits  PROTIME-INR - Abnormal; Notable for the following components:   Prothrombin Time 34.2 (*)    All other components within normal limits    EKG None  Radiology Dg Chest 1 View  Result Date: 08/22/2017 CLINICAL DATA:  Fall, rib pain. EXAM: CHEST - 2 VIEW COMPARISON:  Chest x-ray dated 06/22/2017. FINDINGS: Heart size and mediastinal contours are stable. Atherosclerotic changes again noted at the aortic arch. No new lung findings. No pulmonary edema, pleural effusion or pneumothorax seen. Old healed rib fractures bilaterally. No acute appearing osseous abnormality. IMPRESSION: No acute findings.  Old healed rib fractures bilaterally. Electronically Signed    By: Franki Cabot M.D.   On: 08/22/2017 10:17   Ct Pelvis Wo Contrast  Result Date: 08/22/2017 CLINICAL DATA:  Recent fall with right hip pain, initial encounter EXAM: CT PELVIS WITHOUT CONTRAST TECHNIQUE: Multidetector CT imaging of the pelvis was performed following the standard protocol without intravenous contrast. COMPARISON:  Plain film from earlier in the same day. FINDINGS: Urinary Tract:  Bladder is well distended.  No calculi are seen. Bowel: Visualized large and small bowel demonstrate no focal abnormality. Vascular/Lymphatic: Vascular calcifications are noted without aneurysmal dilatation. No lymphadenopathy is seen. Reproductive:  No mass or other significant abnormality Other:  No free fluid is identified. Musculoskeletal: There are changes consistent with a right sacral fracture and right superior pubic ramus fracture. Inferior pubic ramus fracture is noted on the right as well without significant displacement. Mild localized hemorrhage is  noted related to the fractures of the pubic rami. No other focal abnormality is seen. Significant remodeling of the femoral head is noted. Degenerative changes of the hip joints are seen particularly on the right. No fracture in the right femur is seen. Prior fixation of the left femoral fracture is noted IMPRESSION: Fractures involving the right sacrum as well as the inferior and superior pubic rami on the right. Significant degenerative change of the right hip joint is noted. No acute femoral fracture is noted. Electronically Signed   By: Inez Catalina M.D.   On: 08/22/2017 11:49   Dg Hip Unilat With Pelvis 2-3 Views Right  Result Date: 08/22/2017 CLINICAL DATA:  Fall, RIGHT-sided hip pain, not able to bear weight today. EXAM: DG HIP (WITH OR WITHOUT PELVIS) 2-3V RIGHT COMPARISON:  Plain film of the pelvis dated 07/17/2014. Plain film of the LEFT femur dated 07/17/2014. FINDINGS: Single view of the pelvis and two views of the RIGHT hip are provided. There  is a slightly displaced fracture within the RIGHT superior pubic ramus, adjacent to the symphysis pubis. Configuration of the RIGHT femoral head and RIGHT femoral neck appear stable compared to the previous study. No changes to suggest acute fracture. 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 Fixation hardware within the proximal LEFT femur appears intact and stable in position. IMPRESSION: 1. Slightly displaced fracture within the RIGHT superior pubic ramus. 2. Appearance of the RIGHT femoral head and RIGHT femoral neck appear stable compared to previous plain film of 07/17/2014. No changes to suggest acute fracture. 3. 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 Electronically Signed   By: Franki Cabot M.D.   On: 08/22/2017 10:38    Procedures Procedures (including critical care time)  Medications Ordered in ED Medications  fentaNYL (SUBLIMAZE) injection 50 mcg (50 mcg Intravenous Given 08/22/17 1014)  morphine 4 MG/ML injection 4 mg (4 mg Intravenous Given 08/22/17 1231)     Initial Impression / Assessment and Plan / ED Course  I have reviewed the triage vital signs and the nursing notes.  Pertinent labs & imaging results that were available during my care of the patient were reviewed by me and considered in my medical decision making (see chart for details).    Patient presents after mechanical fall on Coumadin and concern for right hip fracture.  X-rays, blood work ordered.  Pain medicines ordered.  X-ray and CT results reviewed revealing sacral and pelvic fractures.  No hip fracture.  Patient is pain controlled as long as she is not ambulating.  Discussed with orthopedics recommended physical therapy/rehab and pain control nonoperative.  Discussed with hospitalist patient does not meet requirements for admission.  Physical therapy and social work discussed with patient to help  arrange home health.  Pain is controlled in the ER.  Initially oxygen saturation was dropping to 89-90 however patient was not taking deep breaths, spirometer and respiratory worked with patient and at discharge oxygen is 93-94%.  Results and differential diagnosis were discussed with the patient/parent/guardian. Xrays were independently reviewed by myself.  Close follow up outpatient was discussed, comfortable with the plan.   Medications  fentaNYL (SUBLIMAZE) injection 50 mcg (50 mcg Intravenous Given 08/22/17 1014)  morphine 4 MG/ML injection 4 mg (4 mg Intravenous Given 08/22/17 1231)    Vitals:   08/22/17 0926 08/22/17 1100 08/22/17 1330 08/22/17 1338  BP: (!) 184/95 (!) 139/55 (!) 139/51   Pulse: 75  69 71   Resp: 18 16 18    Temp: 98.2 F (36.8 C)     TempSrc: Oral     SpO2: 97% 95% (!) 88% 90%  Weight:        Final diagnoses:  Rib contusion, right, initial encounter  Fall, initial encounter  Acute right hip pain     Final Clinical Impressions(s) / ED Diagnoses   Final diagnoses:  Rib contusion, right, initial encounter  Fall, initial encounter  Acute right hip pain    ED Discharge Orders        Ordered    HYDROcodone-acetaminophen (NORCO) 5-325 MG tablet  Every 4 hours PRN     08/22/17 1442       [name], MD 08/22/17 1524

## 2017-08-22 NOTE — ED Notes (Signed)
Pt to xray

## 2017-08-22 NOTE — ED Notes (Signed)
Oxygen saturation dropping to 88, 2 L oxygen Nasal cannula applied.

## 2017-08-23 ENCOUNTER — Telehealth: Payer: Self-pay | Admitting: *Deleted

## 2017-08-23 NOTE — Telephone Encounter (Signed)
INR in ED yesterday was 3.4.  She held coumadin last night.  Reviewed chart.  INR's have been within normal range.  Told pt to continue previous dose.  [facility] will be seeing pt tomorrow.  Will have them check INR and call results to me. Pt verbalized understanding.

## 2017-08-23 NOTE — Telephone Encounter (Signed)
Please call pt-- she fell and was checked in the ER, they changed her dosing

## 2017-08-25 ENCOUNTER — Ambulatory Visit (INDEPENDENT_AMBULATORY_CARE_PROVIDER_SITE_OTHER): Payer: Medicare PPO | Admitting: *Deleted

## 2017-08-25 ENCOUNTER — Telehealth: Payer: Self-pay | Admitting: *Deleted

## 2017-08-25 DIAGNOSIS — I2602 Saddle embolus of pulmonary artery with acute cor pulmonale: Secondary | ICD-10-CM | POA: Diagnosis not present

## 2017-08-25 DIAGNOSIS — Z5181 Encounter for therapeutic drug level monitoring: Secondary | ICD-10-CM

## 2017-08-25 LAB — POCT INR: INR: 2.9 (ref 2.0–3.0)

## 2017-08-25 NOTE — Telephone Encounter (Signed)
Deanna nurse w/ [facility] [phone]  INR 2.9

## 2017-08-25 NOTE — Telephone Encounter (Signed)
Done.  See coumadin note. 

## 2017-08-25 NOTE — Patient Instructions (Signed)
Continue coumadin 1 tablet daily except 1/2 tablet on Mondays and Fridays Continue greens/salads Recheck in 2 weeks Order given to [employer] [facility]

## 2017-09-06 ENCOUNTER — Ambulatory Visit: Payer: Medicare PPO | Admitting: Cardiology

## 2017-09-19 ENCOUNTER — Ambulatory Visit (INDEPENDENT_AMBULATORY_CARE_PROVIDER_SITE_OTHER): Payer: Medicare PPO | Admitting: *Deleted

## 2017-09-19 DIAGNOSIS — Z5181 Encounter for therapeutic drug level monitoring: Secondary | ICD-10-CM

## 2017-09-19 DIAGNOSIS — I4891 Unspecified atrial fibrillation: Secondary | ICD-10-CM | POA: Diagnosis not present

## 2017-09-19 LAB — POCT INR: INR: 2.8 (ref 2.0–3.0)

## 2017-09-19 NOTE — Patient Instructions (Signed)
Continue coumadin 1 tablet daily except 1/2 tablet on Mondays and Fridays Recheck in 3 weeks

## 2017-10-12 ENCOUNTER — Ambulatory Visit (INDEPENDENT_AMBULATORY_CARE_PROVIDER_SITE_OTHER): Payer: Medicare PPO | Admitting: *Deleted

## 2017-10-12 DIAGNOSIS — I4891 Unspecified atrial fibrillation: Secondary | ICD-10-CM | POA: Diagnosis not present

## 2017-10-12 DIAGNOSIS — Z5181 Encounter for therapeutic drug level monitoring: Secondary | ICD-10-CM

## 2017-10-12 LAB — POCT INR: INR: 2.9 (ref 2.0–3.0)

## 2017-10-12 NOTE — Patient Instructions (Signed)
Continue coumadin 1 tablet daily except 1/2 tablet on Mondays and Fridays Recheck in 4 weeks  

## 2017-10-20 NOTE — Progress Notes (Signed)
Cardiology Office Note  Date: 10/21/2017   ID: Danielle Murphy, DOB 1940-09-11, MRN [id number]  PCP: Jani Gravel, MD  Primary Cardiologist: [name], MD   Chief Complaint  Patient presents with  . Coronary Artery Disease  . Aortic Stenosis    History of Present Illness: Danielle Murphy is a 77 y.o. female last seen in January.  She presents for a routine visit.  From a cardiac perspective, she does not report any angina symptoms or worsening shortness of breath, no palpitations or syncope.  She did have a mechanical fall back in the summer resulting in a right pubic rami fracture.  She had home health physical therapy and has been doing relatively well recently.  Still uses a cane intermittently.  She remains on Coumadin with follow-up in the anticoagulation clinic, history of DVT and pulmonary embolus.  No reported bleeding episodes.  Last INR was 2.9.  Last echocardiogram was in 2017 as outlined below.  She has history of mild to moderate aortic stenosis.  I personally reviewed her ECG today which shows sinus bradycardia with PAC.  We went over her medications.  She reports compliance.  Blood pressure was up significantly today, but she was having some hip pain.  States that typically it is better controlled.  I have asked her to keep an eye on this.  Past Medical History:  Diagnosis Date  . Aortic stenosis   . Coronary atherosclerosis of native coronary artery    BMS LAD 2002  . DVT (deep venous thrombosis) ([place])   . Essential hypertension, benign   . Hyperlipidemia   . Pericardial cyst    Status post excision 2002  . Pulmonary embolism [facility])     Past Surgical History:  Procedure Laterality Date  . Pericardial cyst excision  2002    Current Outpatient Medications  Medication Sig Dispense Refill  . acetaminophen (TYLENOL) 500 MG tablet Take 500 mg by mouth every 6 (six) hours as needed.    Marland Kitchen aspirin 81 MG tablet Take 81 mg by mouth every morning.     Marland Kitchen losartan  (COZAAR) 50 MG tablet TAKE 1 TABLET EVERY DAY 90 tablet 3  . metoprolol succinate (TOPROL-XL) 50 MG 24 hr tablet TAKE 1 AND 1/2 TABLETS EVERY DAY 135 tablet 3  . pravastatin (PRAVACHOL) 20 MG tablet TAKE 1 TABLET EVERY DAY 90 tablet 3  . warfarin (COUMADIN) 4 MG tablet TAKE 1 TABLET EVERY DAY  EXCEPT TAKE 1/2 TABLET  ON  TUESDAYS  AND  FRIDAYS 78 tablet 3   No current facility-administered medications for this visit.    Allergies:  Aspirin; Niacin; Ramipril; and Simvastatin   Social History: The patient  reports that she has never smoked. She has never used smokeless tobacco. She reports that she does not drink alcohol or use drugs.   ROS:  Please see the history of present illness. Otherwise, complete review of systems is positive for hip pain.  All other systems are reviewed and negative.   Physical Exam: VS:  BP (!) 178/76   Pulse (!) 59   Ht 5' (1.524 m)   Wt 119 lb (54 kg)   SpO2 96%   BMI 23.24 kg/m , BMI Body mass index is 23.24 kg/m.  Wt Readings from Last 3 Encounters:  10/21/17 119 lb (54 kg)  08/22/17 118 lb (53.5 kg)  05/20/17 117 lb (53.1 kg)    General: Elderly woman, appears comfortable at rest. HEENT: Conjunctiva and lids normal, oropharynx  clear. Neck: Supple, no elevated JVP or carotid bruits, no thyromegaly. Lungs: Clear to auscultation, nonlabored breathing at rest. Cardiac: Regular rate and rhythm, no S3, 3/6 systolic murmur, no pericardial rub. Abdomen: Soft, nontender, bowel sounds present. Extremities: Wearing compression stockings, distal pulses 2+. Skin: Warm and dry. Musculoskeletal: No kyphosis. Neuropsychiatric: Alert and oriented x3, affect grossly appropriate.  ECG: I personally reviewed the tracing from 08/26/2016 which showed normal sinus rhythm.  Recent Labwork: 08/22/2017: BUN 16; Creatinine, Ser 0.98; Hemoglobin 10.6; Platelets 158; Potassium 3.9; Sodium 138   Other Studies Reviewed Today:  Lexiscan Myoview 09/30/2016:  There was no ST  segment deviation noted during stress.  This is a low risk study.  The left ventricular ejection fraction is hyperdynamic (>65%).  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  Echocardiogram 08/18/2015: Study Conclusions  - Left ventricle: The cavity size was normal. Wall thickness was   normal. Systolic function was normal. The estimated ejection   fraction was in the range of 60% to 65%. Wall motion was normal;   there were no regional wall motion abnormalities. Features are   consistent with a pseudonormal left ventricular filling pattern,   with concomitant abnormal relaxation and increased filling   pressure (grade 2 diastolic dysfunction). Doppler parameters are   consistent with high ventricular filling pressure. - Aortic valve: Moderately calcified annulus. Trileaflet; mildly   thickened, mildly calcified leaflets. There was mild to moderate   stenosis. Peak velocity (S): 218 cm/s. Mean gradient (S): 10 mm   Hg. Valve area (VTI): 1.17 cm^2. Valve area (Vmax): 1.27 cm^2.   Valve area (Vmean): 1.32 cm^2. - Mitral valve: Calcified annulus. There was moderate   regurgitation. - Left atrium: The atrium was mildly dilated. - Right atrium: The atrium was mildly dilated. - Tricuspid valve: There was mild-moderate regurgitation. - Pulmonary arteries: Systolic pressure was mildly increased. PA   peak pressure: 40 mm Hg (S). - Systemic veins: IVC dilated with normal respiratory variation.   Estimated CVP 8 mmHg.  Assessment and Plan:  1.  CAD with history of BMS to the LAD in 2002.  She underwent ischemic testing in 2018 which was low risk and reports no active angina on medical therapy.  Continue observation.  2.  Mild to moderate aortic stenosis by evaluation in 2017.  Plan to obtain a follow-up echocardiogram.  3.  Essential hypertension, continues on Cozaar and Toprol-XL.  Blood pressure elevated today although she was  having some hip pain.  I have asked her to track this more closely in case trend is increasing.  Cozaar could be further increased versus addition of chlorthalidone.  4.  History of DVT and pulmonary embolus, on chronic Coumadin with follow-up in the anticoagulation clinic.  Current medicines were reviewed with the patient today.   Orders Placed This Encounter  Procedures  . EKG 12-Lead  . ECHOCARDIOGRAM COMPLETE    Disposition: Follow-up in 6 months.  Signed, Satira Sark, MD, [facility] 10/21/2017 9:33 AM    [place] [facility] [place]. [address], [place], [place] [address] Phone: [phone]; Fax: [phone]

## 2017-10-21 ENCOUNTER — Ambulatory Visit: Payer: Medicare PPO | Admitting: Cardiology

## 2017-10-21 ENCOUNTER — Encounter: Payer: Self-pay | Admitting: Cardiology

## 2017-10-21 VITALS — BP 178/76 | HR 59 | Ht 60.0 in | Wt 119.0 lb

## 2017-10-21 DIAGNOSIS — I10 Essential (primary) hypertension: Secondary | ICD-10-CM | POA: Diagnosis not present

## 2017-10-21 DIAGNOSIS — Z86718 Personal history of other venous thrombosis and embolism: Secondary | ICD-10-CM

## 2017-10-21 DIAGNOSIS — I2510 Atherosclerotic heart disease of native coronary artery without angina pectoris: Secondary | ICD-10-CM

## 2017-10-21 DIAGNOSIS — I350 Nonrheumatic aortic (valve) stenosis: Secondary | ICD-10-CM | POA: Diagnosis not present

## 2017-10-21 NOTE — Patient Instructions (Signed)
Your physician wants you to follow-up in: 6 months with Dr.McDowell You will receive a reminder letter in the mail two months in advance. If you don't receive a letter, please call our office to schedule the follow-up appointment.    Your physician has requested that you have an echocardiogram. Echocardiography is a painless test that uses sound waves to create images of your heart. It provides your doctor with information about the size and shape of your heart and how well your heart's chambers and valves are working. This procedure takes approximately one hour. There are no restrictions for this procedure.     Your physician recommends that you continue on your current medications as directed. Please refer to the Current Medication list given to you today.     If you need a refill on your cardiac medications before your next appointment, please call your pharmacy.      No lab work today.      Thank you for choosing [facility] !

## 2017-11-09 ENCOUNTER — Ambulatory Visit (HOSPITAL_COMMUNITY): Payer: Medicare PPO | Attending: Cardiology

## 2017-11-09 ENCOUNTER — Ambulatory Visit (INDEPENDENT_AMBULATORY_CARE_PROVIDER_SITE_OTHER): Payer: Medicare PPO | Admitting: *Deleted

## 2017-11-09 DIAGNOSIS — I4891 Unspecified atrial fibrillation: Secondary | ICD-10-CM | POA: Diagnosis not present

## 2017-11-09 DIAGNOSIS — Z5181 Encounter for therapeutic drug level monitoring: Secondary | ICD-10-CM | POA: Diagnosis not present

## 2017-11-09 LAB — POCT INR: INR: 2.3 (ref 2.0–3.0)

## 2017-11-09 NOTE — Patient Instructions (Signed)
Continue coumadin 1 tablet daily except 1/2 tablet on Mondays and Fridays Recheck in 6 weeks 

## 2017-12-05 ENCOUNTER — Other Ambulatory Visit: Payer: Self-pay | Admitting: Cardiology

## 2017-12-14 NOTE — Progress Notes (Signed)
Cardiology Office Note  Date: 12/15/2017   ID: Danielle Murphy, DOB 1940-06-04, MRN [id number]  PCP: Jani Gravel, MD  Primary Cardiologist: [name], MD   Chief Complaint  Patient presents with  . Cardiac follow-up    History of Present Illness: Danielle Murphy is a 77 y.o. female last seen in September.  She presents to discuss blood pressure readings.  She states that she had been noticing elevations in her blood pressure, some systolics over 053Z during time when she was recuperating from a fall with pelvic fracture and had a lot of pain.  She was also not exercising as well.  Since about a month ago she has gotten back to baseline and states that her blood pressures have come down, although still fluctuating from a low of 110 to a high of 767 systolic.  She otherwise has had no other specific cardiac symptoms and remains compliant with her medications.  She is on Coumadin with follow-up in the anticoagulation clinic, history of DVT and pulmonary embolus.  Last echocardiogram was in 2017.  Past Medical History:  Diagnosis Date  . Aortic stenosis   . Coronary atherosclerosis of native coronary artery    BMS LAD 2002  . DVT (deep venous thrombosis) ([place])   . Essential hypertension, benign   . Hyperlipidemia   . Pericardial cyst    Status post excision 2002  . Pulmonary embolism [facility])     Past Surgical History:  Procedure Laterality Date  . Pericardial cyst excision  2002    Current Outpatient Medications  Medication Sig Dispense Refill  . acetaminophen (TYLENOL) 500 MG tablet Take 500 mg by mouth every 6 (six) hours as needed.    Marland Kitchen aspirin 81 MG tablet Take 81 mg by mouth every morning.     Marland Kitchen losartan (COZAAR) 50 MG tablet TAKE 1 TABLET EVERY DAY 90 tablet 3  . metoprolol succinate (TOPROL-XL) 50 MG 24 hr tablet TAKE 1 AND 1/2 TABLETS EVERY DAY 135 tablet 3  . pravastatin (PRAVACHOL) 20 MG tablet TAKE 1 TABLET EVERY DAY 90 tablet 3  . warfarin (COUMADIN) 4 MG tablet  TAKE 1 TABLET EVERY DAY  EXCEPT TAKE 1/2 TABLET  ON  TUESDAYS  AND  FRIDAYS 78 tablet 3   No current facility-administered medications for this visit.    Allergies:  Aspirin; Niacin; Ramipril; and Simvastatin   Social History: The patient  reports that she has never smoked. She has never used smokeless tobacco. She reports that she does not drink alcohol or use drugs.   ROS:  Please see the history of present illness. Otherwise, complete review of systems is positive for none.  All other systems are reviewed and negative.   Physical Exam: VS:  BP (!) 144/66 (BP Location: Right Arm)   Pulse 64   Ht 5' (1.524 m)   Wt 117 lb (53.1 kg)   SpO2 96%   BMI 22.85 kg/m , BMI Body mass index is 22.85 kg/m.  Wt Readings from Last 3 Encounters:  12/15/17 117 lb (53.1 kg)  10/21/17 119 lb (54 kg)  08/22/17 118 lb (53.5 kg)    General: Elderly woman, appears comfortable at rest. HEENT: Conjunctiva and lids normal, oropharynx clear. Neck: Supple, no elevated JVP or carotid bruits, no thyromegaly. Lungs: Clear to auscultation, nonlabored breathing at rest. Cardiac: Regular rate and rhythm, no S3, 2/6 systolic murmur, no pericardial rub. Abdomen: Soft, nontender, bowel sounds present. Extremities: No pitting edema, distal pulses 2+. Skin:  Warm and dry. Musculoskeletal: No kyphosis. Neuropsychiatric: Alert and oriented x3, affect grossly appropriate.  ECG: I personally reviewed the tracing from 10/21/2017 which showed sinus bradycardia with PAC.  Recent Labwork: 08/22/2017: BUN 16; Creatinine, Ser 0.98; Hemoglobin 10.6; Platelets 158; Potassium 3.9; Sodium 138   Other Studies Reviewed Today:  Lexiscan Myoview 09/30/2016:  There was no ST segment deviation noted during stress.  This is a low risk study.  The left ventricular ejection fraction is hyperdynamic (>65%).  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  Echocardiogram  08/18/2015: Study Conclusions  - Left ventricle: The cavity size was normal. Wall thickness was normal. Systolic function was normal. The estimated ejection fraction was in the range of 60% to 65%. Wall motion was normal; there were no regional wall motion abnormalities. Features are consistent with a pseudonormal left ventricular filling pattern, with concomitant abnormal relaxation and increased filling pressure (grade 2 diastolic dysfunction). Doppler parameters are consistent with high ventricular filling pressure. - Aortic valve: Moderately calcified annulus. Trileaflet; mildly thickened, mildly calcified leaflets. There was mild to moderate stenosis. Peak velocity (S): 218 cm/s. Mean gradient (S): 10 mm Hg. Valve area (VTI): 1.17 cm^2. Valve area (Vmax): 1.27 cm^2. Valve area (Vmean): 1.32 cm^2. - Mitral valve: Calcified annulus. There was moderate regurgitation. - Left atrium: The atrium was mildly dilated. - Right atrium: The atrium was mildly dilated. - Tricuspid valve: There was mild-moderate regurgitation. - Pulmonary arteries: Systolic pressure was mildly increased. PA peak pressure: 40 mm Hg (S). - Systemic veins: IVC dilated with normal respiratory variation. Estimated CVP 8 mmHg.  Assessment and Plan:  1.  Essential hypertension.  Now we will continue with current regimen including Cozaar and Toprol-XL.  She will continue to track this, if she starts to see systolics more consistently over the 150s, we may consider adding chlorthalidone.  2.  Mild to moderate aortic stenosis, last echocardiogram in 2017.  3.  CAD status post BMS to the LAD in 2002.  She reports no active angina and underwent low risk ischemic testing in 2018.  Current medicines were reviewed with the patient today.  Disposition: Follow-up in 6 months.  Signed, Satira Sark, MD, [facility] 12/15/2017 10:07 AM    Danielle Murphy at [place]. [address] [place], [place] [address] Phone: [phone]; Fax: [phone]

## 2017-12-15 ENCOUNTER — Encounter: Payer: Self-pay | Admitting: Cardiology

## 2017-12-15 ENCOUNTER — Ambulatory Visit: Payer: Medicare PPO | Admitting: Cardiology

## 2017-12-15 VITALS — BP 144/66 | HR 64 | Ht 60.0 in | Wt 117.0 lb

## 2017-12-15 DIAGNOSIS — I2510 Atherosclerotic heart disease of native coronary artery without angina pectoris: Secondary | ICD-10-CM | POA: Diagnosis not present

## 2017-12-15 DIAGNOSIS — I10 Essential (primary) hypertension: Secondary | ICD-10-CM | POA: Diagnosis not present

## 2017-12-15 DIAGNOSIS — I350 Nonrheumatic aortic (valve) stenosis: Secondary | ICD-10-CM | POA: Diagnosis not present

## 2017-12-15 NOTE — Patient Instructions (Signed)
Medication Instructions:  Your physician recommends that you continue on your current medications as directed. Please refer to the Current Medication list given to you today.  If you need a refill on your cardiac medications before your next appointment, please call your pharmacy.   Lab work: None If you have labs (blood work) drawn today and your tests are completely normal, you will receive your results only by: Marland Kitchen MyChart Message (if you have MyChart) OR . A paper copy in the mail If you have any lab test that is abnormal or we need to change your treatment, we will call you to review the results.  Testing/Procedures: NONE  Follow-Up: At [facility], you and your health needs are our priority.  As part of our continuing mission to provide you with exceptional heart care, we have created designated Provider Care Teams.  These Care Teams include your primary Cardiologist (physician) and Advanced Practice Providers (APPs -  Physician Assistants and Nurse Practitioners) who all work together to provide you with the care you need, when you need it. You will need a follow up appointment in 6 months.  Please call our office 2 months in advance to schedule this appointment.  You may see [name], MD or one of the following Advanced Practice Providers on your designated Care Team:   Bernerd Pho, PA-C [facility]) . Ermalinda Barrios, PA-C ([place])  Any Other Special Instructions Will Be Listed Below (If Applicable). NONE

## 2017-12-21 ENCOUNTER — Ambulatory Visit (INDEPENDENT_AMBULATORY_CARE_PROVIDER_SITE_OTHER): Payer: Medicare PPO | Admitting: *Deleted

## 2017-12-21 DIAGNOSIS — I4891 Unspecified atrial fibrillation: Secondary | ICD-10-CM

## 2017-12-21 DIAGNOSIS — Z5181 Encounter for therapeutic drug level monitoring: Secondary | ICD-10-CM | POA: Diagnosis not present

## 2017-12-21 LAB — POCT INR: INR: 2.4 (ref 2.0–3.0)

## 2017-12-21 NOTE — Patient Instructions (Signed)
Continue coumadin 1 tablet daily except 1/2 tablet on Mondays and Fridays Recheck in 6 weeks 

## 2018-02-01 ENCOUNTER — Ambulatory Visit (INDEPENDENT_AMBULATORY_CARE_PROVIDER_SITE_OTHER): Payer: Medicare Other | Admitting: Pharmacist

## 2018-02-01 DIAGNOSIS — I4891 Unspecified atrial fibrillation: Secondary | ICD-10-CM | POA: Diagnosis not present

## 2018-02-01 DIAGNOSIS — Z5181 Encounter for therapeutic drug level monitoring: Secondary | ICD-10-CM

## 2018-02-01 LAB — POCT INR: INR: 2.8 (ref 2.0–3.0)

## 2018-02-01 NOTE — Patient Instructions (Signed)
Description   Continue coumadin 1 tablet daily except 1/2 tablet on Mondays and Fridays Recheck in 6 weeks

## 2018-03-15 ENCOUNTER — Ambulatory Visit (INDEPENDENT_AMBULATORY_CARE_PROVIDER_SITE_OTHER): Payer: Medicare Other | Admitting: *Deleted

## 2018-03-15 DIAGNOSIS — I4891 Unspecified atrial fibrillation: Secondary | ICD-10-CM

## 2018-03-15 DIAGNOSIS — Z5181 Encounter for therapeutic drug level monitoring: Secondary | ICD-10-CM

## 2018-03-15 LAB — POCT INR: INR: 3.5 — AB (ref 2.0–3.0)

## 2018-03-15 NOTE — Patient Instructions (Signed)
Hold coumadin tonight then resume 1 tablet daily except 1/2 tablet on Mondays and Fridays Recheck in 4 weeks

## 2018-04-12 ENCOUNTER — Ambulatory Visit (INDEPENDENT_AMBULATORY_CARE_PROVIDER_SITE_OTHER): Payer: Medicare Other | Admitting: *Deleted

## 2018-04-12 ENCOUNTER — Other Ambulatory Visit: Payer: Self-pay

## 2018-04-12 DIAGNOSIS — I4891 Unspecified atrial fibrillation: Secondary | ICD-10-CM | POA: Diagnosis not present

## 2018-04-12 DIAGNOSIS — Z5181 Encounter for therapeutic drug level monitoring: Secondary | ICD-10-CM | POA: Diagnosis not present

## 2018-04-12 LAB — POCT INR: INR: 2.8 (ref 2.0–3.0)

## 2018-04-12 NOTE — Patient Instructions (Signed)
Continue coumadin 1 tablet daily except 1/2 tablet on Mondays and Fridays Recheck in 4 weeks  

## 2018-05-10 ENCOUNTER — Telehealth: Payer: Self-pay | Admitting: *Deleted

## 2018-05-10 NOTE — Telephone Encounter (Signed)
° °  COVID-19 Pre-Screening Questions: ° °• Do you currently have a fever?NO ° ° °• Have you recently travelled on a cruise, internationally, or to [place], [place], [place], [place], [place], or [place], [place] ([employer]) ? NO °•  °• Have you been in contact with someone that is currently pending confirmation of Covid19 testing or has been confirmed to have the Covid19 virus?  NO °•  °Are you currently experiencing fatigue or cough? NO ° ° °   ° ° ° ° °

## 2018-05-11 ENCOUNTER — Ambulatory Visit (INDEPENDENT_AMBULATORY_CARE_PROVIDER_SITE_OTHER): Payer: Medicare Other | Admitting: *Deleted

## 2018-05-11 ENCOUNTER — Other Ambulatory Visit: Payer: Self-pay

## 2018-05-11 DIAGNOSIS — Z5181 Encounter for therapeutic drug level monitoring: Secondary | ICD-10-CM

## 2018-05-11 DIAGNOSIS — I4891 Unspecified atrial fibrillation: Secondary | ICD-10-CM | POA: Diagnosis not present

## 2018-05-11 LAB — POCT INR: INR: 2.7 (ref 2.0–3.0)

## 2018-05-11 NOTE — Patient Instructions (Signed)
Continue coumadin 1 tablet daily except 1/2 tablet on Mondays and Fridays Recheck in 4 weeks

## 2018-06-09 ENCOUNTER — Telehealth: Payer: Self-pay | Admitting: Pharmacist

## 2018-06-09 NOTE — Telephone Encounter (Signed)
Spoke with pt - she would like to continue on warfarin for now but will think about changing to Eliquis in the future.

## 2018-06-09 NOTE — Telephone Encounter (Signed)
LMOM for pt to discuss changing from warfarin to DOAC due to better efficacy/safety data as well as to decrease office visits and exposure during COVID-19 pandemic. Pt would qualify for Eliquis 2.5mg  BID (recurrence of VTE dosing) - her copay would $47/month with her insurance plan.

## 2018-06-12 ENCOUNTER — Ambulatory Visit (INDEPENDENT_AMBULATORY_CARE_PROVIDER_SITE_OTHER): Payer: Medicare Other | Admitting: *Deleted

## 2018-06-12 ENCOUNTER — Other Ambulatory Visit: Payer: Self-pay

## 2018-06-12 DIAGNOSIS — Z5181 Encounter for therapeutic drug level monitoring: Secondary | ICD-10-CM

## 2018-06-12 DIAGNOSIS — I4891 Unspecified atrial fibrillation: Secondary | ICD-10-CM

## 2018-06-12 LAB — POCT INR: INR: 2.4 (ref 2.0–3.0)

## 2018-06-12 NOTE — Patient Instructions (Signed)
Continue coumadin 1 tablet daily except 1/2 tablet on Mondays and Fridays Recheck in 6 weeks

## 2018-06-21 ENCOUNTER — Ambulatory Visit: Payer: Medicare Other | Admitting: Cardiology

## 2018-07-24 ENCOUNTER — Ambulatory Visit (INDEPENDENT_AMBULATORY_CARE_PROVIDER_SITE_OTHER): Payer: Medicare Other | Admitting: *Deleted

## 2018-07-24 ENCOUNTER — Other Ambulatory Visit: Payer: Self-pay

## 2018-07-24 DIAGNOSIS — Z5181 Encounter for therapeutic drug level monitoring: Secondary | ICD-10-CM

## 2018-07-24 DIAGNOSIS — I4891 Unspecified atrial fibrillation: Secondary | ICD-10-CM

## 2018-07-24 LAB — POCT INR: INR: 2.2 (ref 2.0–3.0)

## 2018-07-24 NOTE — Patient Instructions (Signed)
Continue coumadin 1 tablet daily except 1/2 tablet on Mondays and Fridays Recheck in 6 weeks

## 2018-07-26 ENCOUNTER — Telehealth: Payer: Self-pay | Admitting: Cardiology

## 2018-07-26 NOTE — Telephone Encounter (Signed)

## 2018-07-31 ENCOUNTER — Telehealth (INDEPENDENT_AMBULATORY_CARE_PROVIDER_SITE_OTHER): Payer: Medicare Other | Admitting: Cardiology

## 2018-07-31 ENCOUNTER — Encounter: Payer: Self-pay | Admitting: Cardiology

## 2018-07-31 VITALS — BP 148/57 | HR 64 | Ht 63.0 in | Wt 115.0 lb

## 2018-07-31 DIAGNOSIS — I350 Nonrheumatic aortic (valve) stenosis: Secondary | ICD-10-CM

## 2018-07-31 DIAGNOSIS — I10 Essential (primary) hypertension: Secondary | ICD-10-CM

## 2018-07-31 DIAGNOSIS — Z7189 Other specified counseling: Secondary | ICD-10-CM

## 2018-07-31 DIAGNOSIS — I25119 Atherosclerotic heart disease of native coronary artery with unspecified angina pectoris: Secondary | ICD-10-CM

## 2018-07-31 DIAGNOSIS — Z86718 Personal history of other venous thrombosis and embolism: Secondary | ICD-10-CM | POA: Diagnosis not present

## 2018-07-31 DIAGNOSIS — I119 Hypertensive heart disease without heart failure: Secondary | ICD-10-CM

## 2018-07-31 NOTE — Patient Instructions (Signed)
Medication Instructions: Your physician recommends that you continue on your current medications as directed. Please refer to the Current Medication list given to you today.   Labwork: none  Procedures/Testing: Your physician has requested that you have an echocardiogram JUST BEFORE NEXT VISIT. Echocardiography is a painless test that uses sound waves to create images of your heart. It provides your doctor with information about the size and shape of your heart and how well your heart's chambers and valves are working. This procedure takes approximately one hour. There are no restrictions for this procedure.    Follow-Up: 6 months with Dr.McDowell  Any Additional Special Instructions Will Be Listed Below (If Applicable).     If you need a refill on your cardiac medications before your next appointment, please call your pharmacy.     Thank you for choosing [facility] !

## 2018-07-31 NOTE — Progress Notes (Signed)
Virtual Visit via Telephone Note   This visit type was conducted due to national recommendations for restrictions regarding the COVID-19 Pandemic (e.g. social distancing) in an effort to limit this patient's exposure and mitigate transmission in our community.  Due to her co-morbid illnesses, this patient is at least at moderate risk for complications without adequate follow up.  This format is felt to be most appropriate for this patient at this time.  The patient did not have access to video technology/had technical difficulties with video requiring transitioning to audio format only (telephone).  All issues noted in this document were discussed and addressed.  No physical exam could be performed with this format.  Please refer to the patient's chart for her  consent to telehealth for [facility].   Date:  07/31/2018   ID:  Danielle Murphy, DOB 1941/01/24, MRN [id number]  Patient Location: Home Provider Location: Office  PCP:  Jani Gravel, MD  Cardiologist:  [name], MD Electrophysiologist:  None   Evaluation Performed:  Follow-Up Visit  Chief Complaint:   Cardiac follow-up  History of Present Illness:    Danielle Murphy is a 78 y.o. female last seen in November 2019.  She did not have video access and we spoke by phone today.  She tells me that her husband passed away back in 2022/04/28.  She has a lot of support from her family and has been doing reasonably well.  She spends a lot of time working outside in her garden.  She does not report any active angina symptoms or shortness of breath.  She is followed in the anticoagulation clinic on Coumadin, history of DVT and pulmonary embolus.  She does not report any bleeding problems.  Last INR was 2.2.  Last echocardiogram was in 28-Apr-2015, we discussed obtaining a follow-up study for reevaluation of aortic stenosis.  The patient does not have symptoms concerning for COVID-19 infection (fever, chills, cough, or new shortness of breath).  She goes  away from her house rarely, relatives do all of her shopping for her.   Past Medical History:  Diagnosis Date  . Aortic stenosis   . Coronary atherosclerosis of native coronary artery    BMS LAD 2000-04-27  . DVT (deep venous thrombosis) ([place])   . Essential hypertension   . Hyperlipidemia   . Pericardial cyst    Status post excision 04-27-00  . Pulmonary embolism [facility])    Past Surgical History:  Procedure Laterality Date  . Pericardial cyst excision  04/27/00     Current Meds  Medication Sig  . acetaminophen (TYLENOL) 500 MG tablet Take 500 mg by mouth every 6 (six) hours as needed.  Marland Kitchen aspirin 81 MG tablet Take 81 mg by mouth every morning.   Marland Kitchen losartan (COZAAR) 50 MG tablet TAKE 1 TABLET EVERY DAY  . metoprolol succinate (TOPROL-XL) 50 MG 24 hr tablet TAKE 1 AND 1/2 TABLETS EVERY DAY  . pravastatin (PRAVACHOL) 20 MG tablet TAKE 1 TABLET EVERY DAY  . warfarin (COUMADIN) 4 MG tablet TAKE 1 TABLET EVERY DAY  EXCEPT TAKE 1/2 TABLET  ON  TUESDAYS  AND  FRIDAYS     Allergies:   Aspirin, Niacin, Ramipril, and Simvastatin   Social History   Tobacco Use  . Smoking status: Never Smoker  . Smokeless tobacco: Never Used  Substance Use Topics  . Alcohol use: No    Alcohol/week: 0.0 standard drinks  . Drug use: No     Family Hx: The patient's family  history includes Cancer in her father; Stroke in her mother.  ROS:   Please see the history of present illness. All other systems reviewed and are negative.   Prior CV studies:   The following studies were reviewed today:  Echocardiogram 08/18/2015: Study Conclusions  - Left ventricle: The cavity size was normal. Wall thickness was   normal. Systolic function was normal. The estimated ejection   fraction was in the range of 60% to 65%. Wall motion was normal;   there were no regional wall motion abnormalities. Features are   consistent with a pseudonormal left ventricular filling pattern,   with concomitant abnormal relaxation and  increased filling   pressure (grade 2 diastolic dysfunction). Doppler parameters are   consistent with high ventricular filling pressure. - Aortic valve: Moderately calcified annulus. Trileaflet; mildly   thickened, mildly calcified leaflets. There was mild to moderate   stenosis. Peak velocity (S): 218 cm/s. Mean gradient (S): 10 mm   Hg. Valve area (VTI): 1.17 cm^2. Valve area (Vmax): 1.27 cm^2.   Valve area (Vmean): 1.32 cm^2. - Mitral valve: Calcified annulus. There was moderate   regurgitation. - Left atrium: The atrium was mildly dilated. - Right atrium: The atrium was mildly dilated. - Tricuspid valve: There was mild-moderate regurgitation. - Pulmonary arteries: Systolic pressure was mildly increased. PA   peak pressure: 40 mm Hg (S). - Systemic veins: IVC dilated with normal respiratory variation.   Estimated CVP 8 mmHg.  Lexiscan Myoview 09/30/2016:  There was no ST segment deviation noted during stress.  This is a low risk study.  The left ventricular ejection fraction is hyperdynamic (>65%).  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  Labs/Other Tests and Data Reviewed:    EKG:  An ECG dated 10/21/2017 was personally reviewed today and demonstrated:  Sinus bradycardia with prolonged PR interval and PAC.  Recent Labs: 08/22/2017: BUN 16; Creatinine, Ser 0.98; Hemoglobin 10.6; Platelets 158; Potassium 3.9; Sodium 138    Wt Readings from Last 3 Encounters:  07/31/18 115 lb (52.2 kg)  12/15/17 117 lb (53.1 kg)  10/21/17 119 lb (54 kg)     Objective:    Vital Signs:  BP (!) 148/57   Pulse 64   Ht 5\' 3"  (1.6 m)   Wt 115 lb (52.2 kg)   BMI 20.37 kg/m    Patient spoke in full sentences, not short of breath. No audible wheezing or coughing. Speech pattern normal.  ASSESSMENT & PLAN:    1.  CAD status post BMS to the LAD in 2002.  She reports no active angina symptoms and underwent a low risk Myoview in  2018.  Continue aspirin and statin.  2.  History of DVT and pulmonary embolus, she is on Coumadin with follow-up in anticoagulation clinic.  3.  Aortic stenosis, mild to moderate by last assessment in 2017.  We will obtain a follow-up echocardiogram for her next visit.  4.  Essential hypertension, no changes made to present regimen.  Keep follow-up with PCP.  COVID-19 Education: The signs and symptoms of COVID-19 were discussed with the patient and how to seek care for testing (follow up with PCP or arrange E-visit).  The importance of social distancing was discussed today.  Time:   Today, I have spent 5 minutes with the patient with telehealth technology discussing the above problems.     Medication Adjustments/Labs and Tests Ordered: Current medicines are reviewed at length with the patient today.  Concerns regarding  medicines are outlined above.   Tests Ordered: Orders Placed This Encounter  Procedures  . ECHOCARDIOGRAM COMPLETE    Medication Changes: No orders of the defined types were placed in this encounter.   Follow Up:  In Person 6 months in the [place] office.  Signed, [name], MD  07/31/2018 1:11 PM    [place]

## 2018-08-08 ENCOUNTER — Ambulatory Visit (HOSPITAL_COMMUNITY): Payer: Medicare Other

## 2018-08-09 ENCOUNTER — Other Ambulatory Visit (HOSPITAL_COMMUNITY): Payer: Self-pay | Admitting: Family Medicine

## 2018-08-09 DIAGNOSIS — Z1231 Encounter for screening mammogram for malignant neoplasm of breast: Secondary | ICD-10-CM

## 2018-09-06 ENCOUNTER — Other Ambulatory Visit: Payer: Self-pay

## 2018-09-06 ENCOUNTER — Ambulatory Visit (INDEPENDENT_AMBULATORY_CARE_PROVIDER_SITE_OTHER): Payer: Medicare Other | Admitting: *Deleted

## 2018-09-06 DIAGNOSIS — I4891 Unspecified atrial fibrillation: Secondary | ICD-10-CM

## 2018-09-06 DIAGNOSIS — Z5181 Encounter for therapeutic drug level monitoring: Secondary | ICD-10-CM | POA: Diagnosis not present

## 2018-09-06 LAB — POCT INR: INR: 2.3 (ref 2.0–3.0)

## 2018-09-06 NOTE — Patient Instructions (Signed)
Continue coumadin 1 tablet daily except 1/2 tablet on Mondays and Fridays Recheck in 6 weeks

## 2018-09-15 ENCOUNTER — Ambulatory Visit (HOSPITAL_COMMUNITY)
Admission: RE | Admit: 2018-09-15 | Discharge: 2018-09-15 | Disposition: A | Discharge status: Home / Self Care | Payer: Medicare Other | Source: Ambulatory Visit | Attending: Cardiology | Admitting: Cardiology

## 2018-09-15 ENCOUNTER — Other Ambulatory Visit: Payer: Self-pay

## 2018-09-15 DIAGNOSIS — I350 Nonrheumatic aortic (valve) stenosis: Secondary | ICD-10-CM

## 2018-09-15 NOTE — Progress Notes (Signed)
*  PRELIMINARY RESULTS* Echocardiogram 2D Echocardiogram has been performed.  Danielle Murphy 09/15/2018, 3:38 PM

## 2018-10-18 ENCOUNTER — Other Ambulatory Visit: Payer: Self-pay

## 2018-10-18 ENCOUNTER — Ambulatory Visit (INDEPENDENT_AMBULATORY_CARE_PROVIDER_SITE_OTHER): Payer: Medicare Other | Admitting: *Deleted

## 2018-10-18 DIAGNOSIS — Z5181 Encounter for therapeutic drug level monitoring: Secondary | ICD-10-CM

## 2018-10-18 DIAGNOSIS — I4891 Unspecified atrial fibrillation: Secondary | ICD-10-CM

## 2018-10-18 LAB — POCT INR: INR: 2.5 (ref 2.0–3.0)

## 2018-10-18 MED ORDER — WARFARIN SODIUM 4 MG PO TABS: ORAL_TABLET | ORAL | 3 refills | Status: DC

## 2018-10-18 NOTE — Patient Instructions (Signed)
Continue coumadin 1 tablet daily except 1/2 tablet on Mondays and Fridays Recheck in 6 weeks 

## 2018-10-20 ENCOUNTER — Other Ambulatory Visit: Payer: Self-pay

## 2018-10-20 MED ORDER — PRAVASTATIN SODIUM 20 MG PO TABS: 20.0000 mg | ORAL_TABLET | Freq: Every day | ORAL | 3 refills | Status: DC

## 2018-10-20 MED ORDER — METOPROLOL SUCCINATE ER 50 MG PO TB24: ORAL_TABLET | ORAL | 3 refills | Status: DC

## 2018-10-20 NOTE — Telephone Encounter (Signed)
Refilled toprol and pravastatin to [facility] on [address]

## 2018-11-29 ENCOUNTER — Other Ambulatory Visit: Payer: Self-pay

## 2018-11-29 ENCOUNTER — Ambulatory Visit (INDEPENDENT_AMBULATORY_CARE_PROVIDER_SITE_OTHER): Payer: Medicare Other | Admitting: *Deleted

## 2018-11-29 DIAGNOSIS — Z5181 Encounter for therapeutic drug level monitoring: Secondary | ICD-10-CM | POA: Diagnosis not present

## 2018-11-29 DIAGNOSIS — I4891 Unspecified atrial fibrillation: Secondary | ICD-10-CM | POA: Diagnosis not present

## 2018-11-29 LAB — POCT INR: INR: 2.2 (ref 2.0–3.0)

## 2018-11-29 MED ORDER — LOSARTAN POTASSIUM 50 MG PO TABS: 50.0000 mg | ORAL_TABLET | Freq: Every day | ORAL | 3 refills | Status: DC

## 2018-11-29 NOTE — Patient Instructions (Signed)
Continue coumadin 1 tablet daily except 1/2 tablet on Mondays and Fridays Recheck in 6 weeks 

## 2018-11-30 ENCOUNTER — Telehealth: Payer: Self-pay | Admitting: Cardiology

## 2018-11-30 MED ORDER — METOPROLOL SUCCINATE ER 50 MG PO TB24: ORAL_TABLET | ORAL | 3 refills | Status: DC

## 2018-11-30 NOTE — Telephone Encounter (Signed)
Please give pt a call concerning her metoprolol succinate (TOPROL-XL) 50 MG 24 hr tablet UI:[phone]  She's wanting to know if it's been changed.  Can leave message or call back around 11

## 2018-11-30 NOTE — Telephone Encounter (Signed)
Sent in new RX. Pt takes Toprol XL 75 mg daily.

## 2019-01-17 ENCOUNTER — Ambulatory Visit (INDEPENDENT_AMBULATORY_CARE_PROVIDER_SITE_OTHER): Payer: Medicare Other | Admitting: *Deleted

## 2019-01-17 ENCOUNTER — Other Ambulatory Visit: Payer: Self-pay

## 2019-01-17 DIAGNOSIS — I4891 Unspecified atrial fibrillation: Secondary | ICD-10-CM

## 2019-01-17 DIAGNOSIS — Z5181 Encounter for therapeutic drug level monitoring: Secondary | ICD-10-CM

## 2019-01-17 LAB — POCT INR: INR: 2.1 (ref 2.0–3.0)

## 2019-01-17 NOTE — Patient Instructions (Signed)
Continue coumadin 1 tablet daily except 1/2 tablet on Mondays and Fridays Recheck in 6 weeks

## 2019-02-28 ENCOUNTER — Other Ambulatory Visit: Payer: Self-pay

## 2019-02-28 ENCOUNTER — Ambulatory Visit (INDEPENDENT_AMBULATORY_CARE_PROVIDER_SITE_OTHER): Payer: Medicare Other | Admitting: *Deleted

## 2019-02-28 DIAGNOSIS — I4891 Unspecified atrial fibrillation: Secondary | ICD-10-CM | POA: Diagnosis not present

## 2019-02-28 DIAGNOSIS — Z5181 Encounter for therapeutic drug level monitoring: Secondary | ICD-10-CM | POA: Diagnosis not present

## 2019-02-28 LAB — POCT INR: INR: 2.4 (ref 2.0–3.0)

## 2019-02-28 NOTE — Patient Instructions (Signed)
Continue coumadin 1 tablet daily except 1/2 tablet on Mondays and Fridays Recheck in 6 weeks

## 2019-03-06 ENCOUNTER — Telehealth: Payer: Self-pay | Admitting: Cardiology

## 2019-03-06 NOTE — Telephone Encounter (Signed)
Virtual Visit Pre-Appointment Phone Call  "(Name), I am calling you today to discuss your upcoming appointment. We are currently trying to limit exposure to the virus that causes COVID-19 by seeing patients at home rather than in the office."  1. "What is the BEST phone number to call the day of the visit?" -  [phone]  2. "Do you have or have access to (through a family member/friend) a smartphone with video capability that we can use for your visit?" a. If yes - list this number in appt notes as "cell" (if different from BEST phone #) and list the appointment type as a VIDEO visit in appointment notes b. If no - list the appointment type as a PHONE visit in appointment notes  3. Confirm consent - "In the setting of the current Covid19 crisis, you are scheduled for a (phone or video) visit with your provider on (date) at (time).  Just as we do with many in-office visits, in order for you to participate in this visit, we must obtain consent.  If you'd like, I can send this to your mychart (if signed up) or email for you to review.  Otherwise, I can obtain your verbal consent now.  All virtual visits are billed to your insurance company just like a normal visit would be.  By agreeing to a virtual visit, we'd like you to understand that the technology does not allow for your provider to perform an examination, and thus may limit your provider's ability to fully assess your condition. If your provider identifies any concerns that need to be evaluated in person, we will make arrangements to do so.  Finally, though the technology is pretty good, we cannot assure that it will always work on either your or our end, and in the setting of a video visit, we may have to convert it to a phone-only visit.  In either situation, we cannot ensure that we have a secure connection.  Are you willing to proceed?" STAFF: Did the patient verbally acknowledge consent to telehealth visit? Document YES/NO here: YES    4. Advise patient to be prepared - "Two hours prior to your appointment, go ahead and check your blood pressure, pulse, oxygen saturation, and your weight (if you have the equipment to check those) and write them all down. When your visit starts, your provider will ask you for this information. If you have an Apple Watch or Kardia device, please plan to have heart rate information ready on the day of your appointment. Please have a pen and paper handy nearby the day of the visit as well."  5. Give patient instructions for MyChart download to smartphone OR Doximity/Doxy.me as below if video visit (depending on what platform provider is using)  6. Inform patient they will receive a phone call 15 minutes prior to their appointment time (may be from unknown caller ID) so they should be prepared to answer    TELEPHONE CALL NOTE  Danielle Murphy has been deemed a candidate for a follow-up tele-health visit to limit community exposure during the Covid-19 pandemic. I spoke with the patient via phone to ensure availability of phone/video source, confirm preferred email & phone number, and discuss instructions and expectations.  I reminded Danielle Murphy to be prepared with any vital sign and/or heart rhythm information that could potentially be obtained via home monitoring, at the time of her visit. I reminded Danielle Murphy to expect a phone call prior to her visit.  Danielle Murphy 03/06/2019 10:58 AM   INSTRUCTIONS FOR DOWNLOADING THE MYCHART APP TO SMARTPHONE  - The patient must first make sure to have activated MyChart and know their login information - If Apple, go to [employer] and type in MyChart in the search bar and download the app. If Android, ask patient to go to [employer] and type in [facility] in the search bar and download the app. The app is free but as with any other app downloads, their phone may require them to verify saved payment information or Apple/Android password.  - The patient  will need to then log into the app with their MyChart username and password, and select [facility] as their healthcare provider to link the account. When it is time for your visit, go to the MyChart app, find appointments, and click Begin Video Visit. Be sure to Select Allow for your device to access the Microphone and Camera for your visit. You will then be connected, and your provider will be with you shortly.  **If they have any issues connecting, or need assistance please contact MyChart service desk [phone])**  **If using a computer, in order to ensure the best quality for their visit they will need to use either of the following Internet Browsers: [employer], or Google Chrome**  IF USING DOXIMITY or DOXY.ME - The patient will receive a link just prior to their visit by text.     FULL LENGTH CONSENT FOR TELE-HEALTH VISIT   I hereby voluntarily request, consent and authorize [facility] and its employed or contracted physicians, physician assistants, nurse practitioners or other licensed health care professionals (the Practitioner), to provide me with telemedicine health care services (the "Services") as deemed necessary by the treating Practitioner. I acknowledge and consent to receive the Services by the Practitioner via telemedicine. I understand that the telemedicine visit will involve communicating with the Practitioner through live audiovisual communication technology and the disclosure of certain medical information by electronic transmission. I acknowledge that I have been given the opportunity to request an in-person assessment or other available alternative prior to the telemedicine visit and am voluntarily participating in the telemedicine visit.  I understand that I have the right to withhold or withdraw my consent to the use of telemedicine in the course of my care at any time, without affecting my right to future care or treatment, and that the Practitioner  or I may terminate the telemedicine visit at any time. I understand that I have the right to inspect all information obtained and/or recorded in the course of the telemedicine visit and may receive copies of available information for a reasonable fee.  I understand that some of the potential risks of receiving the Services via telemedicine include:  Marland Kitchen Delay or interruption in medical evaluation due to technological equipment failure or disruption; . Information transmitted may not be sufficient (e.g. poor resolution of images) to allow for appropriate medical decision making by the Practitioner; and/or  . In rare instances, security protocols could fail, causing a breach of personal health information.  Furthermore, I acknowledge that it is my responsibility to provide information about my medical history, conditions and care that is complete and accurate to the best of my ability. I acknowledge that Practitioner's advice, recommendations, and/or decision may be based on factors not within their control, such as incomplete or inaccurate data provided by me or distortions of diagnostic images or specimens that may result from electronic transmissions. I understand that the  practice of medicine is not an Chief Strategy Officer and that Practitioner makes no warranties or guarantees regarding treatment outcomes. I acknowledge that I will receive a copy of this consent concurrently upon execution via email to the email address I last provided but may also request a printed copy by calling the office of [facility].    I understand that my insurance will be billed for this visit.   I have read or had this consent read to me. . I understand the contents of this consent, which adequately explains the benefits and risks of the Services being provided via telemedicine.  . I have been provided ample opportunity to ask questions regarding this consent and the Services and have had my questions answered to my  satisfaction. . I give my informed consent for the services to be provided through the use of telemedicine in my medical care  By participating in this telemedicine visit I agree to the above.

## 2019-03-19 NOTE — Progress Notes (Signed)
Virtual Visit via Telephone Note   This visit type was conducted due to national recommendations for restrictions regarding the COVID-19 Pandemic (e.g. social distancing) in an effort to limit this patient's exposure and mitigate transmission in our community.  Due to her co-morbid illnesses, this patient is at least at moderate risk for complications without adequate follow up.  This format is felt to be most appropriate for this patient at this time.  The patient did not have access to video technology/had technical difficulties with video requiring transitioning to audio format only (telephone).  All issues noted in this document were discussed and addressed.  No physical exam could be performed with this format.  Please refer to the patient's chart for her  consent to telehealth for [facility].   Date:  03/20/2019   ID:  Danielle Murphy, DOB 1940-09-24, MRN [id number]  Patient Location: Home Provider Location: Office  PCP:  Jani Gravel, MD  Cardiologist:  [name], MD Electrophysiologist:  None   Evaluation Performed:  Follow-Up Visit  Chief Complaint:  Cardiac follow-up  History of Present Illness:    Danielle Murphy is a 79 y.o. female last assessed via telehealth encounter in July 2020.  We spoke by phone today.  She states that she is doing reasonably well, no obvious angina symptoms or increasing shortness of breath with typical ADLs.  She has been very careful about social distancing, essentially stays at home most of the time with her son who has Down syndrome.  She continues on Coumadin with follow-up in the anticoagulation clinic.  Recent INR was 2.4.  She does not report any bleeding problems.  Follow-up echocardiogram from August 2020 revealed LVEF 60 to 65% with evidence of moderate aortic stenosis as outlined below.  I went over the results with her and we will plan a follow-up study in about 6 months.  We went over her medications today, no changes were made.  The  patient does not have symptoms concerning for COVID-19 infection (fever, chills, cough, or new shortness of breath).  She does plan to get the vaccine.   Past Medical History:  Diagnosis Date  . Aortic stenosis   . Coronary atherosclerosis of native coronary artery    BMS LAD 2002  . DVT (deep venous thrombosis) ([place])   . Essential hypertension   . Hyperlipidemia   . Pericardial cyst    Status post excision 2002  . Pulmonary embolism [facility])    Past Surgical History:  Procedure Laterality Date  . Pericardial cyst excision  2002     Current Meds  Medication Sig  . acetaminophen (TYLENOL) 500 MG tablet Take 500 mg by mouth every 6 (six) hours as needed.  Marland Kitchen aspirin 81 MG tablet Take 81 mg by mouth every morning.   Marland Kitchen losartan (COZAAR) 50 MG tablet Take 1 tablet (50 mg total) by mouth daily.  . metoprolol succinate (TOPROL-XL) 50 MG 24 hr tablet Take 1 & 1/2 tablets by mouth (75 mg) daily.  . pravastatin (PRAVACHOL) 20 MG tablet Take 1 tablet (20 mg total) by mouth daily.  Marland Kitchen warfarin (COUMADIN) 4 MG tablet TAKE 1 TABLET EVERY DAY  EXCEPT TAKE 1/2 TABLET  ON  MONDAYS  AND  FRIDAYS     Allergies:   Aspirin, Niacin, Ramipril, and Simvastatin   ROS:   No palpitations or syncope.  Prior CV studies:   The following studies were reviewed today:  Echocardiogram 09/15/2018: 1. The left ventricle has normal systolic function  with an ejection  fraction of 60-65%. The cavity size was normal. Left ventricular diastolic  Doppler parameters are consistent with pseudonormalization. No evidence of  left ventricular regional wall  motion abnormalities.  2. The right ventricle has normal systolic function. The cavity was  normal. There is no increase in right ventricular wall thickness. Right  ventricular systolic pressure is normal with an estimated pressure of 27.1  mmHg.  3. Left atrial size was mildly dilated.  4. Mildly thickened tricuspid valve leaflets.  5. The aortic valve is  tricuspid. Mild calcification of the aortic valve.  Moderate stenosis of the aortic valve with relatively low mean gradient of  12 mmHg, but dimentionless index of 0.36. Mild to moderate aortic annular  calcification noted.  6. The mitral valve is grossly normal. Mild calcification of the mitral  valve leaflet. There is mild mitral annular calcification present. Mitral  valve regurgitation is moderate by color flow Doppler.  7. The tricuspid valve is grossly normal.  8. The aorta is normal unless otherwise noted.   Labs/Other Tests and Data Reviewed:    EKG:  An ECG dated 10/21/2017 was personally reviewed today and demonstrated:  Sinus bradycardia with prolonged PR interval and PAC.  Recent Labs:  July 2019: Potassium 3.9, BUN 16, creatinine 0.98  Wt Readings from Last 3 Encounters:  03/20/19 115 lb (52.2 kg)  07/31/18 115 lb (52.2 kg)  12/15/17 117 lb (53.1 kg)     Objective:    Vital Signs:  BP (!) 137/56   Pulse (!) 57   Ht 5' (1.524 m)   Wt 115 lb (52.2 kg)   BMI 22.46 kg/m    Patient spoke in full sentences, not short of breath. No audible wheezing or coughing. Speech pattern normal.  ASSESSMENT & PLAN:    1.  CAD status post BMS to the LAD in 2002.  We are following her conservatively on medical therapy in the absence of recurring angina symptoms.  Continue aspirin, Cozaar, Toprol-XL, and Pravachol.  2.  Moderate aortic valve stenosis, last echocardiogram was in August 2020.  We will plan to repeat study in 6 months with clinical reassessment.  3.  Essential hypertension, continue Cozaar and Toprol-XL, systolic in the [id number] today.  4.  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  She is followed in the anticoagulation clinic.   Time:   Today, I have spent 5 minutes with the patient with telehealth technology discussing the above problems.     Medication Adjustments/Labs and Tests Ordered: Current  medicines are reviewed at length with the patient today.  Concerns regarding medicines are outlined above.   Tests Ordered: Orders Placed This Encounter  Procedures  . ECHOCARDIOGRAM COMPLETE    Medication Changes: No orders of the defined types were placed in this encounter.   Follow Up:  In Person 6 months in the [place] office.  Signed, [name], MD  03/20/2019 11:15 AM    [place]

## 2019-03-20 ENCOUNTER — Encounter: Payer: Self-pay | Admitting: Cardiology

## 2019-03-20 ENCOUNTER — Telehealth (INDEPENDENT_AMBULATORY_CARE_PROVIDER_SITE_OTHER): Payer: Medicare Other | Admitting: Cardiology

## 2019-03-20 VITALS — BP 137/56 | HR 57 | Ht 60.0 in | Wt 115.0 lb

## 2019-03-20 DIAGNOSIS — I10 Essential (primary) hypertension: Secondary | ICD-10-CM

## 2019-03-20 DIAGNOSIS — I25119 Atherosclerotic heart disease of native coronary artery with unspecified angina pectoris: Secondary | ICD-10-CM | POA: Diagnosis not present

## 2019-03-20 DIAGNOSIS — I350 Nonrheumatic aortic (valve) stenosis: Secondary | ICD-10-CM

## 2019-03-20 DIAGNOSIS — Z86718 Personal history of other venous thrombosis and embolism: Secondary | ICD-10-CM

## 2019-03-20 DIAGNOSIS — D6869 Other thrombophilia: Secondary | ICD-10-CM

## 2019-03-20 DIAGNOSIS — Z86711 Personal history of pulmonary embolism: Secondary | ICD-10-CM

## 2019-03-20 DIAGNOSIS — Z7901 Long term (current) use of anticoagulants: Secondary | ICD-10-CM

## 2019-03-20 NOTE — Patient Instructions (Addendum)
Medication Instructions:    Your physician recommends that you continue on your current medications as directed. Please refer to the Current Medication list given to you today.  Labwork:  NONE  Testing/Procedures: Your physician has requested that you have an echocardiogram in 6 months just before your next visit. Echocardiography is a painless test that uses sound waves to create images of your heart. It provides your doctor with information about the size and shape of your heart and how well your heart's chambers and valves are working. This procedure takes approximately one hour. There are no restrictions for this procedure.  Follow-Up:  Your physician recommends that you schedule a follow-up appointment in: 6 months (office) [place]. You will receive a reminder letter in the mail in about 4 months reminding you to call and schedule your appointment. If you don't receive this letter, please contact our office.  Any Other Special Instructions Will Be Listed Below (If Applicable).  If you need a refill on your cardiac medications before your next appointment, please call your pharmacy.

## 2019-04-11 ENCOUNTER — Other Ambulatory Visit: Payer: Self-pay

## 2019-04-11 ENCOUNTER — Ambulatory Visit (INDEPENDENT_AMBULATORY_CARE_PROVIDER_SITE_OTHER): Payer: Medicare Other | Admitting: *Deleted

## 2019-04-11 DIAGNOSIS — Z5181 Encounter for therapeutic drug level monitoring: Secondary | ICD-10-CM | POA: Diagnosis not present

## 2019-04-11 DIAGNOSIS — I4891 Unspecified atrial fibrillation: Secondary | ICD-10-CM

## 2019-04-11 LAB — POCT INR: INR: 2 (ref 2.0–3.0)

## 2019-04-11 NOTE — Patient Instructions (Signed)
Continue coumadin 1 tablet daily except 1/2 tablet on Mondays and Fridays Recheck in 6 weeks

## 2019-05-22 ENCOUNTER — Ambulatory Visit (INDEPENDENT_AMBULATORY_CARE_PROVIDER_SITE_OTHER): Payer: Medicare Other | Admitting: *Deleted

## 2019-05-22 ENCOUNTER — Other Ambulatory Visit: Payer: Self-pay

## 2019-05-22 DIAGNOSIS — I4891 Unspecified atrial fibrillation: Secondary | ICD-10-CM

## 2019-05-22 DIAGNOSIS — Z5181 Encounter for therapeutic drug level monitoring: Secondary | ICD-10-CM | POA: Diagnosis not present

## 2019-05-22 LAB — POCT INR: INR: 1.8 — AB (ref 2.0–3.0)

## 2019-05-22 NOTE — Patient Instructions (Signed)
Take warfarin 1 1/2 tablets tonight then increase dose to 1 tablet daily except 1/2 tablet on Mondays Recheck in 4 weeks

## 2019-06-19 ENCOUNTER — Ambulatory Visit (INDEPENDENT_AMBULATORY_CARE_PROVIDER_SITE_OTHER): Payer: Medicare Other | Admitting: *Deleted

## 2019-06-19 ENCOUNTER — Other Ambulatory Visit: Payer: Self-pay

## 2019-06-19 DIAGNOSIS — I4891 Unspecified atrial fibrillation: Secondary | ICD-10-CM

## 2019-06-19 DIAGNOSIS — Z5181 Encounter for therapeutic drug level monitoring: Secondary | ICD-10-CM | POA: Diagnosis not present

## 2019-06-19 LAB — POCT INR: INR: 2.7 (ref 2.0–3.0)

## 2019-06-19 NOTE — Patient Instructions (Signed)
Continue warfarin 1 tablet daily except 1/2 tablet on Mondays  Recheck in 4 weeks 

## 2019-07-19 ENCOUNTER — Ambulatory Visit (INDEPENDENT_AMBULATORY_CARE_PROVIDER_SITE_OTHER): Payer: Medicare Other | Admitting: *Deleted

## 2019-07-19 ENCOUNTER — Other Ambulatory Visit: Payer: Self-pay

## 2019-07-19 DIAGNOSIS — I4891 Unspecified atrial fibrillation: Secondary | ICD-10-CM

## 2019-07-19 DIAGNOSIS — Z5181 Encounter for therapeutic drug level monitoring: Secondary | ICD-10-CM

## 2019-07-19 LAB — POCT INR: INR: 2.4 (ref 2.0–3.0)

## 2019-07-19 NOTE — Patient Instructions (Signed)
Continue warfarin 1 tablet daily except 1/2 tablet on Mondays Recheck in 6 weeks

## 2019-08-30 ENCOUNTER — Ambulatory Visit (INDEPENDENT_AMBULATORY_CARE_PROVIDER_SITE_OTHER): Payer: Medicare Other | Admitting: *Deleted

## 2019-08-30 DIAGNOSIS — Z5181 Encounter for therapeutic drug level monitoring: Secondary | ICD-10-CM | POA: Diagnosis not present

## 2019-08-30 DIAGNOSIS — I4891 Unspecified atrial fibrillation: Secondary | ICD-10-CM

## 2019-08-30 LAB — POCT INR: INR: 2 (ref 2.0–3.0)

## 2019-08-30 NOTE — Patient Instructions (Signed)
Continue warfarin 1 tablet daily except 1/2 tablet on Mondays Recheck in 6 weeks

## 2019-08-31 ENCOUNTER — Other Ambulatory Visit: Payer: Self-pay | Admitting: Cardiology

## 2019-09-20 ENCOUNTER — Other Ambulatory Visit: Payer: Self-pay

## 2019-09-20 ENCOUNTER — Ambulatory Visit (HOSPITAL_COMMUNITY)
Admission: RE | Admit: 2019-09-20 | Discharge: 2019-09-20 | Disposition: A | Discharge status: Home / Self Care | Payer: Medicare Other | Source: Ambulatory Visit | Attending: Cardiology | Admitting: Cardiology

## 2019-09-20 DIAGNOSIS — I350 Nonrheumatic aortic (valve) stenosis: Secondary | ICD-10-CM | POA: Diagnosis not present

## 2019-09-20 LAB — ECHOCARDIOGRAM COMPLETE
AR max vel: 1.47 cm2
AV Area VTI: 1.43 cm2
AV Area mean vel: 1.37 cm2
AV Mean grad: 12.7 mmHg
AV Peak grad: 21.2 mmHg
Ao pk vel: 2.3 m/s
Area-P 1/2: 3.79 cm2
MV M vel: 5.93 m/s
MV Peak grad: 140.7 mmHg
S' Lateral: 2.12 cm

## 2019-09-20 NOTE — Progress Notes (Signed)
*  PRELIMINARY RESULTS* Echocardiogram 2D Echocardiogram has been performed. Patients blood pressure was 183/69 and 185/69. Patient reported her blood pressure is never that high and may be elevated because she did not receive a reminder call about today's echo. Patient reported no headache, dizziness or blurred vision. Informed patient to take her blood pressure at home after she has relaxed for a while and again tonight before bed. If it still high she can call [place] office or if she feels bad call or go to ed.  Leavy Cella 09/20/2019, 12:26 PM

## 2019-10-10 ENCOUNTER — Encounter: Payer: Self-pay | Admitting: Internal Medicine

## 2019-10-11 ENCOUNTER — Other Ambulatory Visit: Payer: Self-pay

## 2019-10-11 ENCOUNTER — Ambulatory Visit (INDEPENDENT_AMBULATORY_CARE_PROVIDER_SITE_OTHER): Payer: Medicare Other | Admitting: *Deleted

## 2019-10-11 DIAGNOSIS — Z5181 Encounter for therapeutic drug level monitoring: Secondary | ICD-10-CM | POA: Diagnosis not present

## 2019-10-11 DIAGNOSIS — I4891 Unspecified atrial fibrillation: Secondary | ICD-10-CM | POA: Diagnosis not present

## 2019-10-11 LAB — POCT INR: INR: 2.3 (ref 2.0–3.0)

## 2019-10-11 NOTE — Patient Instructions (Signed)
Continue warfarin 1 tablet daily except 1/2 tablet on Mondays Recheck in 6 weeks

## 2019-10-13 ENCOUNTER — Other Ambulatory Visit: Payer: Self-pay | Admitting: Cardiology

## 2019-10-15 ENCOUNTER — Encounter: Payer: Self-pay | Admitting: Cardiology

## 2019-10-15 ENCOUNTER — Ambulatory Visit: Payer: Medicare Other | Admitting: Cardiology

## 2019-10-15 VITALS — BP 160/62 | HR 58 | Ht 60.0 in | Wt 110.0 lb

## 2019-10-15 DIAGNOSIS — I350 Nonrheumatic aortic (valve) stenosis: Secondary | ICD-10-CM | POA: Diagnosis not present

## 2019-10-15 DIAGNOSIS — I25119 Atherosclerotic heart disease of native coronary artery with unspecified angina pectoris: Secondary | ICD-10-CM | POA: Diagnosis not present

## 2019-10-15 NOTE — Patient Instructions (Signed)

## 2019-10-15 NOTE — Progress Notes (Signed)
Cardiology Office Note  Date: 10/15/2019   ID: CANDIES PALM, DOB 25-Mar-1940, MRN [id number]  PCP:  Danielle Robert, FNP  Cardiologist:  [name], MD Electrophysiologist:  None   Chief Complaint  Patient presents with  . Cardiac follow-up    History of Present Illness: Danielle Murphy is a 79 y.o. female last assessed via telehealth encounter in February.  She presents for a routine visit.  States that she continues to do well in terms of ADLs and outside yard work, no change in stamina or increasing shortness of breath.  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  She is on Coumadin with follow-up in the anticoagulation clinic.  Last INR was 2.3.  No reported spontaneous bleeding problems.  Follow-up echocardiogram in August shows LVEF 60 to 65% with overall stable, moderate calcific aortic stenosis.  Past Medical History:  Diagnosis Date  . Aortic stenosis   . Coronary atherosclerosis of native coronary artery    BMS LAD 2002  . DVT (deep venous thrombosis) ([place])   . Essential hypertension   . Hyperlipidemia   . Pericardial cyst    Status post excision 2002  . Pulmonary embolism [facility])     Past Surgical History:  Procedure Laterality Date  . Pericardial cyst excision  2002    Current Outpatient Medications  Medication Sig Dispense Refill  . acetaminophen (TYLENOL) 500 MG tablet Take 500 mg by mouth every 6 (six) hours as needed.    Marland Kitchen aspirin 81 MG tablet Take 81 mg by mouth every morning.     Marland Kitchen losartan (COZAAR) 50 MG tablet Take 1 tablet (50 mg total) by mouth daily. 90 tablet 3  . metoprolol succinate (TOPROL-XL) 50 MG 24 hr tablet Take 1 & 1/2 tablets by mouth (75 mg) daily. 135 tablet 3  . pravastatin (PRAVACHOL) 20 MG tablet TAKE 1 TABLET BY MOUTH EVERY DAY 90 tablet 3  . warfarin (COUMADIN) 4 MG tablet TAKE 1 TABLET EVERY DAY EXCEPT TAKE 1/2 TABLET ON MONDAYS AND FRIDAYS 90 tablet 3   No current facility-administered medications for this visit.    Allergies:  Aspirin, Niacin, Ramipril, and Simvastatin   ROS:  No palpitations or syncope.  Physical Exam: VS:  BP (!) 160/62   Pulse (!) 58   Ht 5' (1.524 m)   Wt 110 lb (49.9 kg)   SpO2 98%   BMI 21.48 kg/m , BMI Body mass index is 21.48 kg/m.  Wt Readings from Last 3 Encounters:  10/15/19 110 lb (49.9 kg)  03/20/19 115 lb (52.2 kg)  07/31/18 115 lb (52.2 kg)    General: Elderly woman, appears comfortable at rest. HEENT: Conjunctiva and lids normal, wearing a mask. Neck: Supple, no elevated JVP or carotid bruits, no thyromegaly. Lungs: Clear to auscultation, nonlabored breathing at rest. Cardiac: Regular rate and rhythm, no S3, 3/6 systolic murmur, no pericardial rub. Extremities: No pitting edema, distal pulses 2+.  ECG:  An ECG dated 10/21/2017 was personally reviewed today and demonstrated:  Sinus bradycardia with prolonged PR interval and PAC.  Recent Labwork:  No interval lab work for review today.  Other Studies Reviewed Today:  Echocardiogram 09/20/2019: 1. Left ventricular ejection fraction, by estimation, is 60 to 65%. The  left ventricle has normal function. The left ventricle has no regional  wall motion abnormalities. There is mild left ventricular hypertrophy.  Left ventricular diastolic parameters  are indeterminate.  2. Right ventricular systolic function is normal. The right ventricular  size is normal. There is mildly elevated pulmonary artery systolic  pressure.  3. The mitral valve is normal in structure. Mild mitral valve  regurgitation. No evidence of mitral stenosis.  4. The aortic valve has an indeterminant number of cusps. Aortic valve  regurgitation is mild. Mild to moderate aortic valve stenosis. Aortic  valve mean gradient measures 12.7 mmHg. Aortic valve peak gradient  measures 21.2 mmHg. Aortic valve area, by VTI  measures 1.43 cm.  5. The inferior vena cava is normal in size with greater than 50%  respiratory variability,  suggesting right atrial pressure of 3 mmHg.   Assessment and Plan:  1.  Moderate calcific aortic stenosis, stable by follow-up echocardiogram in August.  Continue with observation for now.  Repeat echocardiogram in 1 year.  2.  CAD status post BMS to the LAD in 2002.  She reports no active angina and we continue medical therapy and observation.  Aspirin, Toprol-XL, losartan, and Pravachol.  3.  History of recurrent DVT and pulmonary embolus.  She is on Coumadin with follow-up in the anticoagulation clinic.  Medication Adjustments/Labs and Tests Ordered: Current medicines are reviewed at length with the patient today.  Concerns regarding medicines are outlined above.   Tests Ordered: Orders Placed This Encounter  Procedures  . EKG 12-Lead    Medication Changes: No orders of the defined types were placed in this encounter.   Disposition:  Follow up 6 months in the [place] office.  Signed, Satira Sark, MD, [facility] 10/15/2019 11:21 AM    [facility] [place], [place], [place] [address] Phone: [phone]; Fax: [phone]

## 2019-10-17 ENCOUNTER — Other Ambulatory Visit: Payer: Self-pay | Admitting: Cardiology

## 2019-10-30 IMAGING — DX DG FEMUR 2+V*L*
4 series · 4 of 4 positions shown · non-contrast
Comparison: 07/17/2014

CLINICAL DATA: Pain in the left hip since twisting injury a couple
of weeks ago.

EXAM:
LEFT FEMUR 2 VIEWS

[femur ap (1 of 2)]
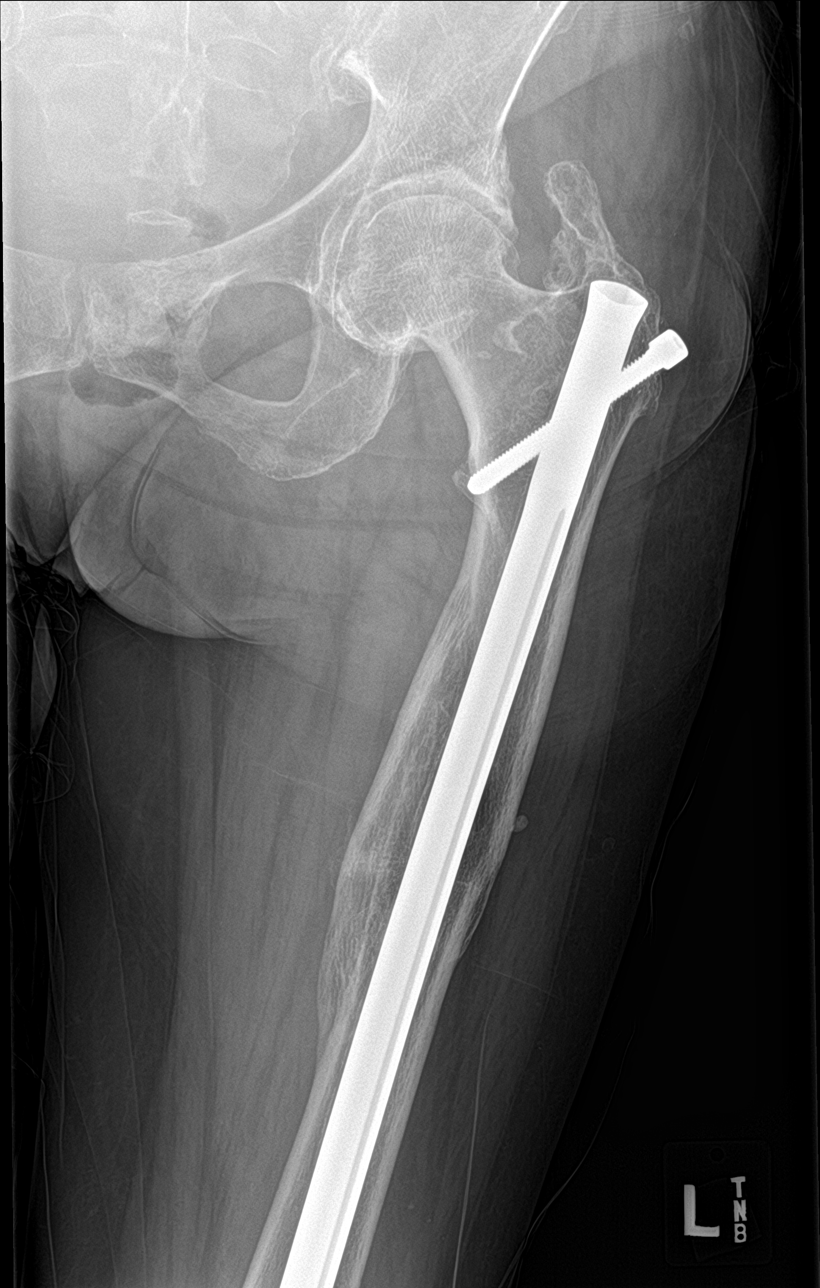

[femur ap (2 of 2)]
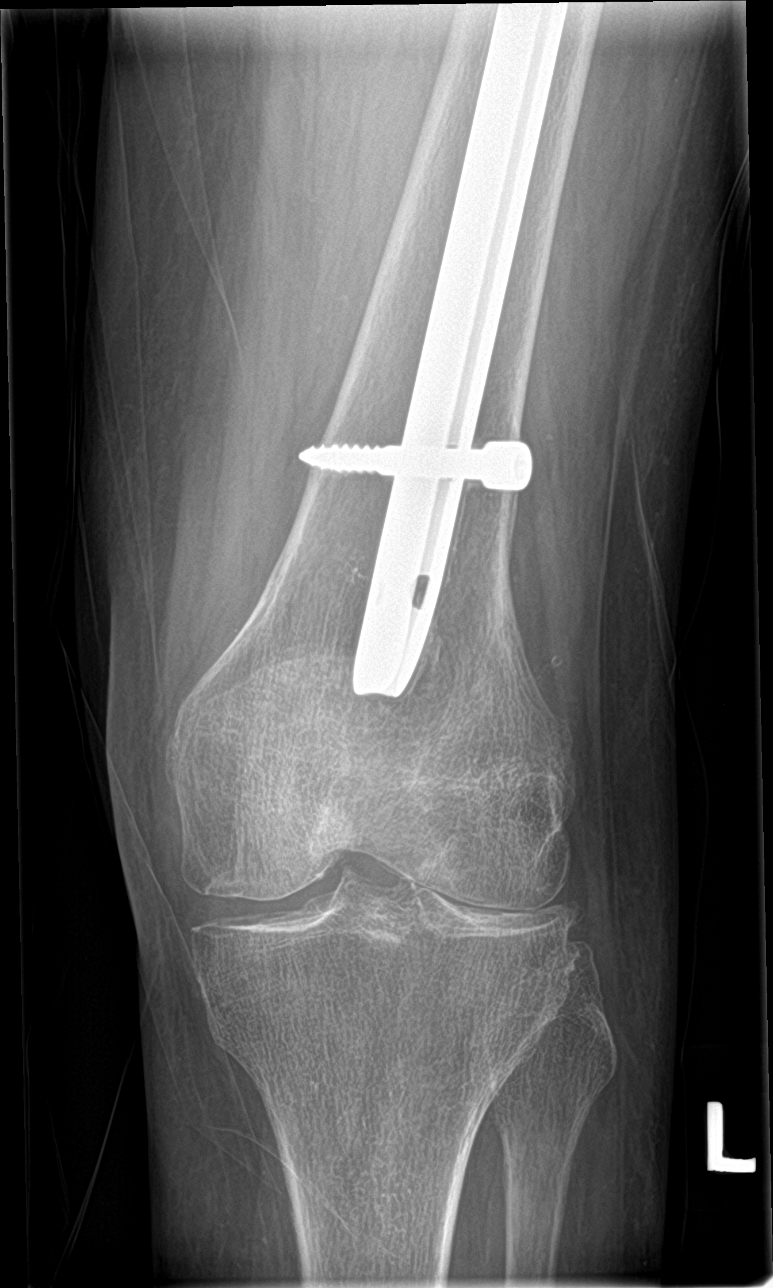

[femur lat (1 of 2)]
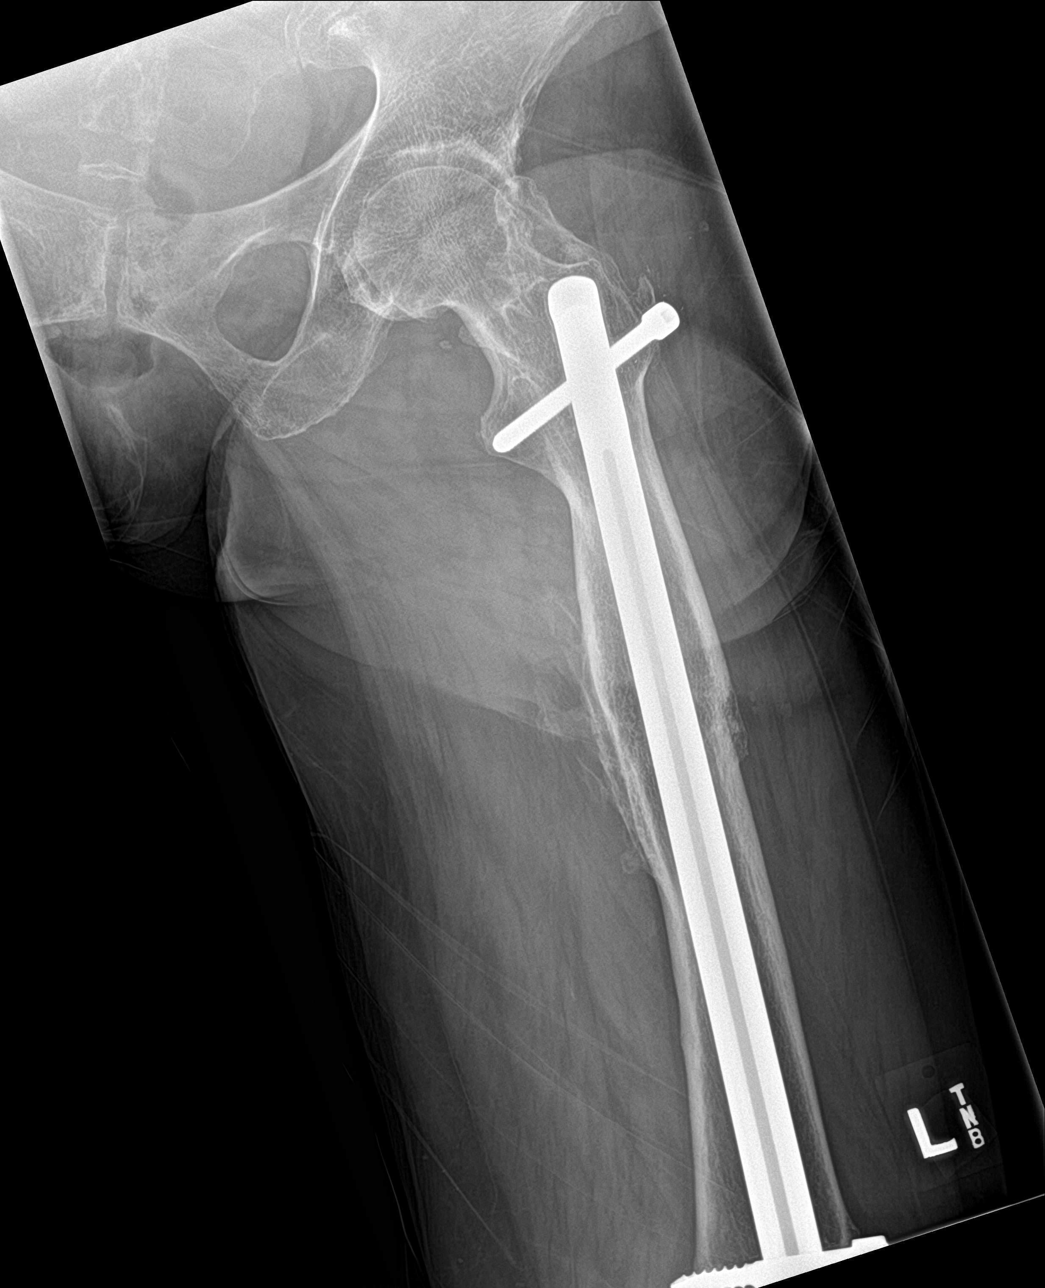

[femur lat (2 of 2)]
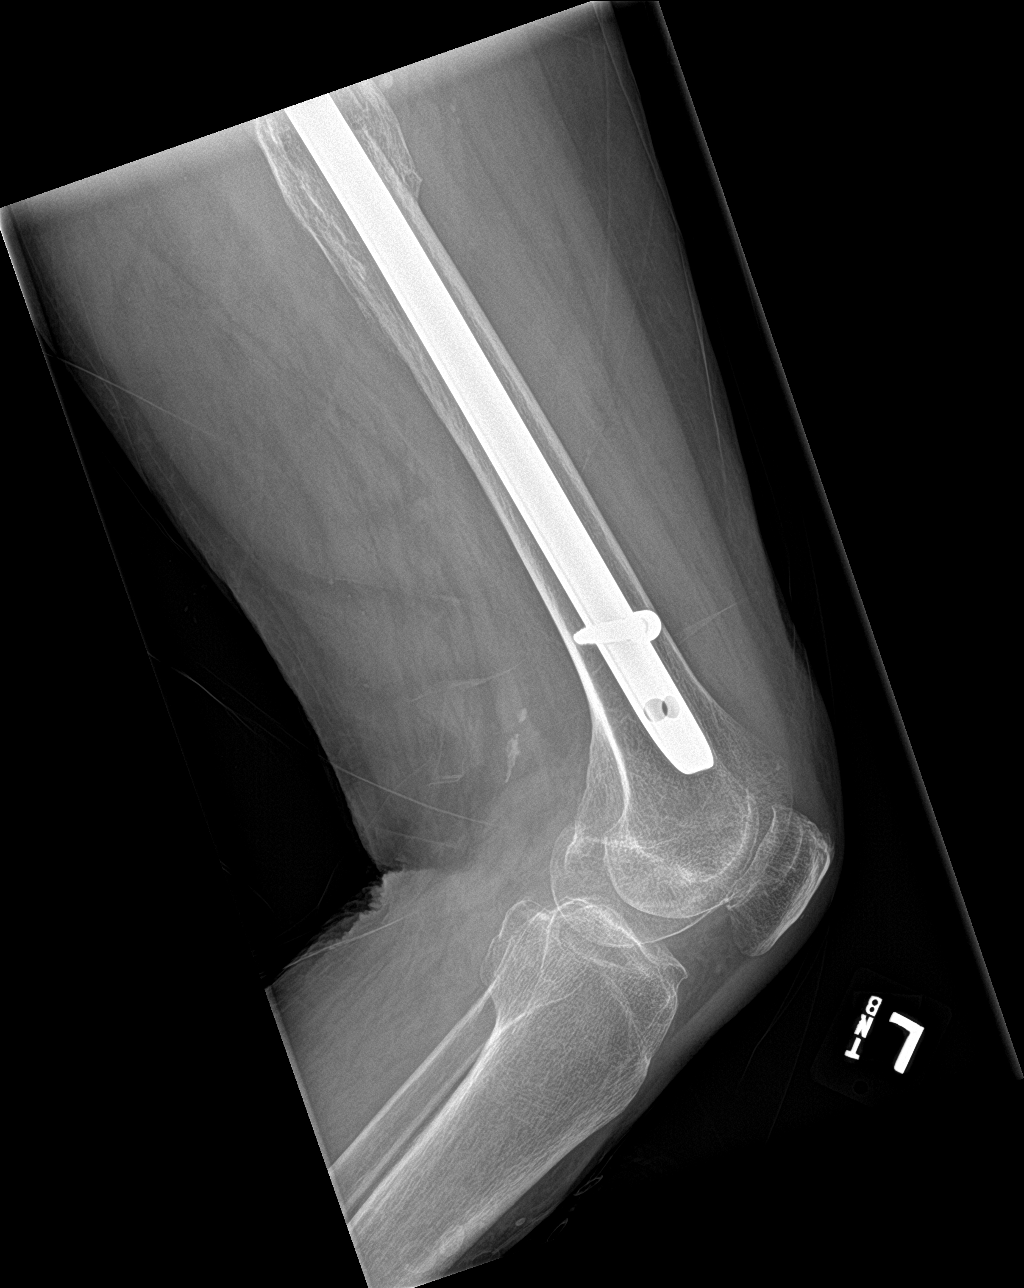

[4 of 4 positions shown; findings below may reference images not displayed]

FINDINGS: Intramedullary rod and screw fixation of an old healed fracture of
the midshaft left femur. Heterotopic bone formation at the greater
trochanter. Degenerative changes in the left hip. Degenerative
changes in the left knee. There is mild lucency at the bone hardware
interface similar to previous study. No evidence of acute fracture
or dislocation of the left femur. Vascular calcifications.
IMPRESSION: Intramedullary rod and screw fixation of an old healed fracture of
the midshaft left femur. No acute bony abnormalities.

## 2019-11-27 ENCOUNTER — Ambulatory Visit (INDEPENDENT_AMBULATORY_CARE_PROVIDER_SITE_OTHER): Payer: Medicare Other | Admitting: *Deleted

## 2019-11-27 DIAGNOSIS — I4891 Unspecified atrial fibrillation: Secondary | ICD-10-CM

## 2019-11-27 DIAGNOSIS — Z5181 Encounter for therapeutic drug level monitoring: Secondary | ICD-10-CM | POA: Diagnosis not present

## 2019-11-27 LAB — POCT INR: INR: 1.9 — AB (ref 2.0–3.0)

## 2019-11-27 NOTE — Patient Instructions (Signed)
Take warfarin 1 1/2 tablets tonight then resume 1 tablet daily except 1/2 tablet on Mondays Recheck in 6 weeks

## 2019-12-13 ENCOUNTER — Ambulatory Visit: Payer: Medicare Other | Admitting: Nurse Practitioner

## 2020-01-08 ENCOUNTER — Ambulatory Visit (INDEPENDENT_AMBULATORY_CARE_PROVIDER_SITE_OTHER): Payer: Medicare Other | Admitting: *Deleted

## 2020-01-08 DIAGNOSIS — Z5181 Encounter for therapeutic drug level monitoring: Secondary | ICD-10-CM | POA: Diagnosis not present

## 2020-01-08 DIAGNOSIS — I4891 Unspecified atrial fibrillation: Secondary | ICD-10-CM | POA: Diagnosis not present

## 2020-01-08 LAB — POCT INR: INR: 3 (ref 2.0–3.0)

## 2020-01-08 NOTE — Patient Instructions (Signed)
Continue warfarin 1 tablet daily except 1/2 tablet on Mondays Recheck in 6 weeks

## 2020-02-19 ENCOUNTER — Other Ambulatory Visit: Payer: Self-pay

## 2020-02-19 ENCOUNTER — Ambulatory Visit (INDEPENDENT_AMBULATORY_CARE_PROVIDER_SITE_OTHER): Payer: Medicare Other | Admitting: *Deleted

## 2020-02-19 DIAGNOSIS — Z5181 Encounter for therapeutic drug level monitoring: Secondary | ICD-10-CM | POA: Diagnosis not present

## 2020-02-19 DIAGNOSIS — I4891 Unspecified atrial fibrillation: Secondary | ICD-10-CM

## 2020-02-19 LAB — POCT INR: INR: 3 (ref 2.0–3.0)

## 2020-02-19 NOTE — Patient Instructions (Signed)
Continue warfarin 1 tablet daily except 1/2 tablet on Mondays Recheck in 6 weeks

## 2020-04-02 ENCOUNTER — Ambulatory Visit (INDEPENDENT_AMBULATORY_CARE_PROVIDER_SITE_OTHER): Payer: Medicare Other | Admitting: *Deleted

## 2020-04-02 DIAGNOSIS — I4891 Unspecified atrial fibrillation: Secondary | ICD-10-CM

## 2020-04-02 DIAGNOSIS — Z5181 Encounter for therapeutic drug level monitoring: Secondary | ICD-10-CM

## 2020-04-02 LAB — POCT INR: INR: 3 (ref 2.0–3.0)

## 2020-04-02 NOTE — Patient Instructions (Signed)
Continue warfarin 1 tablet daily except 1/2 tablet on Mondays Recheck in 6 weeks

## 2020-04-14 ENCOUNTER — Ambulatory Visit: Payer: Medicare Other | Admitting: Cardiology

## 2020-04-14 ENCOUNTER — Encounter: Payer: Self-pay | Admitting: Cardiology

## 2020-04-14 VITALS — BP 140/68 | HR 85 | Wt 111.8 lb

## 2020-04-14 DIAGNOSIS — I25119 Atherosclerotic heart disease of native coronary artery with unspecified angina pectoris: Secondary | ICD-10-CM | POA: Diagnosis not present

## 2020-04-14 DIAGNOSIS — I350 Nonrheumatic aortic (valve) stenosis: Secondary | ICD-10-CM | POA: Diagnosis not present

## 2020-04-14 DIAGNOSIS — Z86718 Personal history of other venous thrombosis and embolism: Secondary | ICD-10-CM

## 2020-04-14 NOTE — Progress Notes (Signed)
Cardiology Office Note  Date: 04/14/2020   ID: CHIYOKO Murphy, DOB 06-21-1940, MRN [id number]  PCP:  [name], FNP  Cardiologist:  [name], MD Electrophysiologist:  None   Chief Complaint  Patient presents with  . Cardiac follow-up    History of Present Illness: Danielle Murphy is a 80 y.o. female last seen in September 2021.  She presents for a routine follow-up visit.  She does not report any exertional angina or change in stamina.  Has been walking about 1/2 mile to three quarters of a mile most days of the week for exercise.  Also enjoying getting outside in her yard as the weather warms up.  She remains on Coumadin with follow-up in the anticoagulation clinic.  Recent INR 3.0.  She does not report any spontaneous bleeding problems.  I reviewed her medications which are outlined below.  Echocardiogram in August 2021 revealed mild to moderate calcific aortic stenosis with mild aortic regurgitation, mean gradient 12.7 mmHg.  Past Medical History:  Diagnosis Date  . Aortic stenosis   . Coronary atherosclerosis of native coronary artery    BMS LAD 2002  . DVT (deep venous thrombosis) ([place])   . Essential hypertension   . Hyperlipidemia   . Pericardial cyst    Status post excision 2002  . Pulmonary embolism [facility])     Past Surgical History:  Procedure Laterality Date  . Pericardial cyst excision  2002    Current Outpatient Medications  Medication Sig Dispense Refill  . acetaminophen (TYLENOL) 500 MG tablet Take 500 mg by mouth every 6 (six) hours as needed.    Marland Kitchen aspirin 81 MG tablet Take 81 mg by mouth every morning.    Marland Kitchen losartan (COZAAR) 50 MG tablet TAKE 1 TABLET BY MOUTH EVERY DAY 90 tablet 3  . metoprolol succinate (TOPROL-XL) 50 MG 24 hr tablet TAKE 1 & 1/2 TABLETS BY MOUTH (75 MG) DAILY. 135 tablet 3  . pravastatin (PRAVACHOL) 20 MG tablet TAKE 1 TABLET BY MOUTH EVERY DAY 90 tablet 3  . warfarin (COUMADIN) 4 MG tablet TAKE 1 TABLET EVERY DAY EXCEPT TAKE  1/2 TABLET ON MONDAYS AND FRIDAYS 90 tablet 3   No current facility-administered medications for this visit.   Allergies:  Aspirin, Niacin, Ramipril, and Simvastatin   ROS: No palpitations or syncope.  Physical Exam: VS:  BP 140/68 (BP Location: Left Arm, Patient Position: Sitting, Cuff Size: Normal)   Pulse 85   Wt 111 lb 12.8 oz (50.7 kg)   SpO2 98%   BMI 21.83 kg/m , BMI Body mass index is 21.83 kg/m.  Wt Readings from Last 3 Encounters:  04/14/20 111 lb 12.8 oz (50.7 kg)  10/15/19 110 lb (49.9 kg)  03/20/19 115 lb (52.2 kg)    General: Patient appears comfortable at rest. HEENT: Conjunctiva and lids normal, wearing a mask. Neck: Supple, no elevated JVP or carotid bruits, no thyromegaly. Lungs: Clear to auscultation, nonlabored breathing at rest. Cardiac: Regular rate and rhythm, no S3, 5-3/6 systolic murmur, no pericardial rub. Extremities: No pitting edema.  ECG:  An ECG dated 10/15/2019 was personally reviewed today and demonstrated:  Sinus bradycardia with prolonged PR interval and low voltage, decreased anteroseptal R wave progression.  Recent Labwork:  No interval lab work for review today.  Other Studies Reviewed Today:  Echocardiogram 09/20/2019: 1. Left ventricular ejection fraction, by estimation, is 60 to 65%. The  left ventricle has normal function. The left ventricle has no regional  wall motion  abnormalities. There is mild left ventricular hypertrophy.  Left ventricular diastolic parameters  are indeterminate.  2. Right ventricular systolic function is normal. The right ventricular  size is normal. There is mildly elevated pulmonary artery systolic  pressure.  3. The mitral valve is normal in structure. Mild mitral valve  regurgitation. No evidence of mitral stenosis.  4. The aortic valve has an indeterminant number of cusps. Aortic valve  regurgitation is mild. Mild to moderate aortic valve stenosis. Aortic  valve mean gradient measures 12.7 mmHg.  Aortic valve peak gradient  measures 21.2 mmHg. Aortic valve area, by VTI  measures 1.43 cm.  5. The inferior vena cava is normal in size with greater than 50%  respiratory variability, suggesting right atrial pressure of 3 mmHg.   Assessment and Plan:  1.  Mild to moderate calcific aortic stenosis, mean gradient approximately 13 mmHg by echocardiogram in August 2021.  She is asymptomatic at this time, no significant change in murmur.  Continue observation for now.  2.  CAD status post BMS to the LAD in 2002.  Would continue with observation on medical therapy in the absence of angina symptoms.  She remains on low-dose aspirin which she has tolerated, losartan, Toprol-XL, and Pravachol.  Continue to follow lipids with PCP.  3.  History of recurrent DVT, tolerating long-term anticoagulation on Coumadin with follow-up in the anticoagulation clinic.  Medication Adjustments/Labs and Tests Ordered: Current medicines are reviewed at length with the patient today.  Concerns regarding medicines are outlined above.   Tests Ordered: No orders of the defined types were placed in this encounter.   Medication Changes: No orders of the defined types were placed in this encounter.   Disposition:  Follow up 6 months in the [place] office.  Signed, Satira Sark, MD, [facility] 04/14/2020 10:03 AM    [facility] at [place], [place], [place] [address] Phone: [phone]; Fax: [phone]

## 2020-04-14 NOTE — Patient Instructions (Signed)

## 2020-05-14 ENCOUNTER — Ambulatory Visit (INDEPENDENT_AMBULATORY_CARE_PROVIDER_SITE_OTHER): Payer: Medicare Other | Admitting: *Deleted

## 2020-05-14 DIAGNOSIS — I4891 Unspecified atrial fibrillation: Secondary | ICD-10-CM

## 2020-05-14 DIAGNOSIS — Z5181 Encounter for therapeutic drug level monitoring: Secondary | ICD-10-CM

## 2020-05-14 LAB — POCT INR: INR: 2.1 (ref 2.0–3.0)

## 2020-05-14 NOTE — Patient Instructions (Signed)
Continue warfarin 1 tablet daily except 1/2 tablet on Mondays Recheck in 6 weeks

## 2020-06-07 ENCOUNTER — Other Ambulatory Visit: Payer: Self-pay | Admitting: Cardiology

## 2020-06-09 ENCOUNTER — Encounter: Payer: Self-pay | Admitting: *Deleted

## 2020-06-25 ENCOUNTER — Ambulatory Visit (INDEPENDENT_AMBULATORY_CARE_PROVIDER_SITE_OTHER): Payer: Medicare Other | Admitting: *Deleted

## 2020-06-25 DIAGNOSIS — I4891 Unspecified atrial fibrillation: Secondary | ICD-10-CM | POA: Diagnosis not present

## 2020-06-25 DIAGNOSIS — Z5181 Encounter for therapeutic drug level monitoring: Secondary | ICD-10-CM

## 2020-06-25 LAB — POCT INR: INR: 1.9 — AB (ref 2.0–3.0)

## 2020-06-25 NOTE — Patient Instructions (Signed)
Take warfarin 1 1/2 tablets tonight then resume 1 tablet daily except 1/2 tablet on Mondays Recheck in 6 weeks

## 2020-08-11 ENCOUNTER — Other Ambulatory Visit: Payer: Self-pay

## 2020-08-11 ENCOUNTER — Ambulatory Visit: Payer: Medicare Other | Admitting: *Deleted

## 2020-08-11 DIAGNOSIS — Z5181 Encounter for therapeutic drug level monitoring: Secondary | ICD-10-CM

## 2020-08-11 DIAGNOSIS — I4891 Unspecified atrial fibrillation: Secondary | ICD-10-CM | POA: Diagnosis not present

## 2020-08-11 LAB — POCT INR: INR: 3.2 — AB (ref 2.0–3.0)

## 2020-08-11 NOTE — Patient Instructions (Signed)
Hold warfarin today then resume 1 tablet daily except 1/2 tablet on Mondays Recheck in 6 weeks

## 2020-09-22 ENCOUNTER — Other Ambulatory Visit: Payer: Self-pay

## 2020-09-22 ENCOUNTER — Ambulatory Visit: Payer: Medicare Other | Admitting: *Deleted

## 2020-09-22 DIAGNOSIS — Z5181 Encounter for therapeutic drug level monitoring: Secondary | ICD-10-CM | POA: Diagnosis not present

## 2020-09-22 DIAGNOSIS — I4891 Unspecified atrial fibrillation: Secondary | ICD-10-CM

## 2020-09-22 LAB — POCT INR: INR: 2.8 (ref 2.0–3.0)

## 2020-09-22 NOTE — Patient Instructions (Signed)
Continue warfarin 1 tablet daily except 1/2 tablet on Mondays Recheck in 6 weeks

## 2020-09-30 ENCOUNTER — Encounter (HOSPITAL_COMMUNITY): Payer: Self-pay

## 2020-09-30 ENCOUNTER — Other Ambulatory Visit: Payer: Self-pay

## 2020-09-30 ENCOUNTER — Emergency Department (HOSPITAL_COMMUNITY)
Admission: EM | Admit: 2020-09-30 | Discharge: 2020-09-30 | Disposition: A | Discharge status: Home / Self Care | Payer: Medicare Other | Attending: Emergency Medicine | Admitting: Emergency Medicine

## 2020-09-30 DIAGNOSIS — R21 Rash and other nonspecific skin eruption: Secondary | ICD-10-CM | POA: Insufficient documentation

## 2020-09-30 DIAGNOSIS — Z5321 Procedure and treatment not carried out due to patient leaving prior to being seen by health care provider: Secondary | ICD-10-CM | POA: Diagnosis not present

## 2020-09-30 NOTE — ED Triage Notes (Signed)
Pov from home with cc of rash "Comes and goes" "ive had it before"  "it is worse today"   Pt has a rash to her face. Denies any new products. No shortness of breath.  Has not taken any meds for it.

## 2020-10-01 ENCOUNTER — Emergency Department (HOSPITAL_COMMUNITY)
Admission: EM | Admit: 2020-10-01 | Discharge: 2020-10-01 | Disposition: A | Discharge status: Home / Self Care | Payer: Medicare Other | Attending: Emergency Medicine | Admitting: Emergency Medicine

## 2020-10-01 ENCOUNTER — Encounter (HOSPITAL_COMMUNITY): Payer: Self-pay | Admitting: Emergency Medicine

## 2020-10-01 ENCOUNTER — Other Ambulatory Visit: Payer: Self-pay

## 2020-10-01 ENCOUNTER — Ambulatory Visit
Admission: EM | Admit: 2020-10-01 | Discharge: 2020-10-01 | Disposition: A | Discharge status: Home / Self Care | Payer: Medicare Other | Attending: Family Medicine | Admitting: Family Medicine

## 2020-10-01 DIAGNOSIS — Z5321 Procedure and treatment not carried out due to patient leaving prior to being seen by health care provider: Secondary | ICD-10-CM | POA: Diagnosis not present

## 2020-10-01 DIAGNOSIS — R21 Rash and other nonspecific skin eruption: Secondary | ICD-10-CM | POA: Insufficient documentation

## 2020-10-01 DIAGNOSIS — B9689 Other specified bacterial agents as the cause of diseases classified elsewhere: Secondary | ICD-10-CM

## 2020-10-01 DIAGNOSIS — T7840XA Allergy, unspecified, initial encounter: Secondary | ICD-10-CM

## 2020-10-01 DIAGNOSIS — I10 Essential (primary) hypertension: Secondary | ICD-10-CM

## 2020-10-01 DIAGNOSIS — L089 Local infection of the skin and subcutaneous tissue, unspecified: Secondary | ICD-10-CM

## 2020-10-01 MED ORDER — PREDNISONE 20 MG PO TABS: 40.0000 mg | ORAL_TABLET | Freq: Every day | ORAL | 0 refills | Status: DC

## 2020-10-01 MED ORDER — AMOXICILLIN-POT CLAVULANATE 875-125 MG PO TABS: 1.0000 | ORAL_TABLET | Freq: Two times a day (BID) | ORAL | 0 refills | Status: DC

## 2020-10-01 NOTE — ED Triage Notes (Signed)
Patient presents to Urgent Care with complaints of a face rash that appeared on Sunday evening. Daughter states she is concerned with shingles. No changes in medications, foods, or lotions. Treating symptoms with Claritin and cold compresses with some relief.   Denies fever or  n/v.

## 2020-10-01 NOTE — ED Triage Notes (Signed)
Pt c/o intermittent rash to face for days but states it is worse tonight.

## 2020-10-01 NOTE — Discharge Instructions (Addendum)
Your blood pressure was noted to be elevated during your visit today. If you are currently taking medication for high blood pressure, please ensure you are taking this as directed. If you do not have a history of high blood pressure and your blood pressure remains persistently elevated, you may need to begin taking a medication at some point. You may return here within the next few days to recheck if unable to see your primary care provider or if you do not have a one.  BP (S) (!) 193/64 (BP Location: Right Arm) Comment: did not take her BP med this morning  Pulse 61   Temp 98.1 F (36.7 C) (Oral)   Resp 16   SpO2 96%   BP Readings from Last 3 Encounters:  10/01/20 (S) (!) 193/64  10/01/20 (!) 191/52  04/14/20 140/68

## 2020-10-01 NOTE — ED Provider Notes (Signed)
[facility]   [id number] 10/01/20 Arrival Time: [id number]  ASSESSMENT & PLAN:  1. Allergic reaction, initial encounter   2. Localized bacterial skin infection   3. Elevated blood pressure reading in office with diagnosis of hypertension    Stop Neosporin use; ques localized reaction. Discussed. Begin: Meds ordered this encounter  Medications   predniSONE (DELTASONE) 20 MG tablet    Sig: Take 2 tablets (40 mg total) by mouth daily.    Dispense:  10 tablet    Refill:  0   amoxicillin-clavulanate (AUGMENTIN) 875-125 MG tablet    Sig: Take 1 tablet by mouth every 12 (twelve) hours.    Dispense:  14 tablet    Refill:  0     Discharge Instructions      Your blood pressure was noted to be elevated during your visit today. If you are currently taking medication for high blood pressure, please ensure you are taking this as directed. If you do not have a history of high blood pressure and your blood pressure remains persistently elevated, you may need to begin taking a medication at some point. You may return here within the next few days to recheck if unable to see your primary care provider or if you do not have a one.  BP (S) (!) 193/64 (BP Location: Right Arm) Comment: did not take her BP med this morning  Pulse 61   Temp 98.1 F (36.7 C) (Oral)   Resp 16   SpO2 96%   BP Readings from Last 3 Encounters:  10/01/20 (S) (!) 193/64  10/01/20 (!) 191/52  04/14/20 140/68   No s/s of HTN urgency. Has BP cuff at home to monitor.    Follow-up Information     [name], FNP.   Specialty: Family Medicine Why: If worsening or failing to improve as anticipated. Contact information: [place] [address] [place].   Specialty: Emergency Medicine Why: If symptoms worsen in any way. Contact information: [address] [id number] Prudy Feeler [place] [id number] [phone]                 Reviewed expectations re: course of current medical issues. Questions answered. Outlined signs and symptoms indicating need for more acute intervention. Patient verbalized understanding. After Visit Summary given.   SUBJECTIVE:  SHAQUEENA GORT is a 80 y.o. female who presents with a skin complaint. With facial swelling and redness; x 2-3 days. Slight itching. No new exposures. No swallowing or respiratory difficulties. Claritin without much relief.   Increased blood pressure noted today. Reports that she is treated for HTN. She reports taking medications as instructed, no chest pain on exertion, no dyspnea on exertion, no swelling of ankles, no orthostatic dizziness or lightheadedness, no orthopnea or paroxysmal nocturnal dyspnea, no palpitations, and no intermittent claudication symptoms. No headaches.   OBJECTIVE: Vitals:   10/01/20 1045 10/01/20 1050  BP:  (S) (!) 193/64  Pulse:  61  Resp:  16  Temp:  98.1 F (36.7 C)  TempSrc: Oral Oral  SpO2:  96%    General appearance: alert; no distress HEENT: [place]; AT Neck: supple with FROM; without LAD CV: reg Extremities: no edema; moves all extremities normally Skin: warm and dry; bilateral facial swelling/edema with overlying erythema; cool to touch; bridge of nose with rectangular shaped area of erythematous skin (where she has been applying Neosporin) with approx 3-80m slight ulceration  of skin at center; honey-colored drainage; no bleeding Psychological: alert and cooperative; normal mood and affect  Allergies  Allergen Reactions   Aspirin Other (See Comments)    Takes '81mg'$  but has a childhood history of oral swelling with higher doses. Currently taking Coumadin   Niacin Other (See Comments)    Muscle tightness   Ramipril Other (See Comments)    Unknown reaction   Simvastatin Other (See Comments)    Cramping and muscle tightness    Past Medical History:  Diagnosis Date   Aortic stenosis    Coronary  atherosclerosis of native coronary artery    BMS LAD 2002   DVT (deep venous thrombosis) ([facility])    Essential hypertension    Hyperlipidemia    Pericardial cyst    Status post excision 2002   Pulmonary embolism ([facility])    Social History   Socioeconomic History   Marital status: Married    Spouse name: Not on file   Number of children: Not on file   Years of education: Not on file   Highest education level: Not on file  Occupational History   Occupation: Retired  Tobacco Use   Smoking status: Never   Smokeless tobacco: Never  Vaping Use   Vaping Use: Never used  Substance and Sexual Activity   Alcohol use: No    Alcohol/week: 0.0 standard drinks   Drug use: No   Sexual activity: Not on file  Other Topics Concern   Not on file  Social History Narrative   Not on file   Social Determinants of Health   Financial Resource Strain: Not on file  Food Insecurity: Not on file  Transportation Needs: Not on file  Physical Activity: Not on file  Stress: Not on file  Social Connections: Not on file  Intimate Partner Violence: Not on file   Family History  Problem Relation Age of Onset   Stroke Mother    Cancer Father    Past Surgical History:  Procedure Laterality Date   Pericardial cyst excision  2002      Vanessa Kick, MD 10/04/20 1115

## 2020-10-01 NOTE — ED Notes (Signed)
Pt seen walking down hallway, leaving.

## 2020-10-05 ENCOUNTER — Other Ambulatory Visit: Payer: Self-pay | Admitting: Cardiology

## 2020-10-17 ENCOUNTER — Ambulatory Visit: Payer: Medicare Other | Admitting: Cardiology

## 2020-10-17 ENCOUNTER — Encounter: Payer: Self-pay | Admitting: Cardiology

## 2020-10-17 VITALS — BP 172/68 | HR 66 | Ht 62.0 in | Wt 105.6 lb

## 2020-10-17 DIAGNOSIS — Z86718 Personal history of other venous thrombosis and embolism: Secondary | ICD-10-CM

## 2020-10-17 DIAGNOSIS — I25119 Atherosclerotic heart disease of native coronary artery with unspecified angina pectoris: Secondary | ICD-10-CM

## 2020-10-17 DIAGNOSIS — I10 Essential (primary) hypertension: Secondary | ICD-10-CM | POA: Diagnosis not present

## 2020-10-17 DIAGNOSIS — I350 Nonrheumatic aortic (valve) stenosis: Secondary | ICD-10-CM

## 2020-10-17 MED ORDER — LOSARTAN POTASSIUM 50 MG PO TABS: 75.0000 mg | ORAL_TABLET | Freq: Every day | ORAL | 6 refills | Status: DC

## 2020-10-17 NOTE — Addendum Note (Signed)
Addended by: Laurine Blazer on: 10/17/2020 05:11 PM   Modules accepted: Orders

## 2020-10-17 NOTE — Progress Notes (Signed)
Cardiology Office Note  Date: 10/17/2020   ID: Danielle Murphy, DOB 03/30/1940, MRN [id number]  PCP:  [name], FNP  Cardiologist:  [name], MD Electrophysiologist:  None   Chief Complaint  Patient presents with   Cardiac follow-up    History of Present Illness: Danielle Murphy is an 80 y.o. female last seen in March.  She is here for a follow-up visit.  She continues to live in her own home, functional with ADLs including yard work, enjoys walking when the weather allows.  She continues to follow in the anticoagulation clinic on Coumadin.  She has been anticoagulated long-term given history of recurrent DVT and previous pulmonary embolus.  Echocardiogram from August of last year revealed LVEF 60 to 65% and evidence of mild to moderate aortic stenosis with mean gradient 13 mmHg.  I reviewed her medications and we discussed increasing losartan for better blood pressure control.  I personally reviewed her ECG today which shows sinus rhythm with prolonged PR interval.  Past Medical History:  Diagnosis Date   Aortic stenosis    Coronary atherosclerosis of native coronary artery    BMS LAD 2002   DVT (deep venous thrombosis) ([facility])    Essential hypertension    Hyperlipidemia    Pericardial cyst    Status post excision 2002   Pulmonary embolism [facility])     Past Surgical History:  Procedure Laterality Date   Pericardial cyst excision  2002    Current Outpatient Medications  Medication Sig Dispense Refill   acetaminophen (TYLENOL) 500 MG tablet Take 500 mg by mouth every 6 (six) hours as needed.     aspirin 81 MG tablet Take 81 mg by mouth every morning.     metoprolol succinate (TOPROL-XL) 50 MG 24 hr tablet TAKE 1 & 1/2 TABLETS BY MOUTH (75 MG) DAILY. 135 tablet 3   pravastatin (PRAVACHOL) 20 MG tablet TAKE 1 TABLET BY MOUTH EVERY DAY 90 tablet 0   warfarin (COUMADIN) 4 MG tablet TAKE 1 TABLET EVERY DAY  EXCEPT TAKE 1/2 TABLET  ON  MONDAYS 90 tablet 3   losartan  (COZAAR) 50 MG tablet Take 1.5 tablets (75 mg total) by mouth daily. 45 tablet 6   No current facility-administered medications for this visit.   Allergies:  Aspirin, Niacin, Ramipril, and Simvastatin   ROS: No palpitations or syncope.  Physical Exam: VS:  BP (!) 172/68   Pulse 66   Ht 5\' 2"  (1.575 m)   Wt 105 lb 9.6 oz (47.9 kg)   SpO2 97%   BMI 19.31 kg/m , BMI Body mass index is 19.31 kg/m.  Wt Readings from Last 3 Encounters:  10/17/20 105 lb 9.6 oz (47.9 kg)  10/01/20 104 lb 15 oz (47.6 kg)  04/14/20 111 lb 12.8 oz (50.7 kg)    General: Patient appears comfortable at rest. HEENT: Conjunctiva and lids normal, wearing a mask. Neck: Supple, no elevated JVP or carotid bruits, no thyromegaly. Lungs: Clear to auscultation, nonlabored breathing at rest. Cardiac: Regular rate and rhythm, no S3, 3/6 systolic murmur. Extremities: No pitting edema.  ECG:  An ECG dated 10/15/2019 was personally reviewed today and demonstrated:  Sinus bradycardia with prolonged PR interval and low voltage, decreased anteroseptal R wave progression.  Recent Labwork:  July 2021: Hemoglobin 11.8, platelets 249, BUN 22, creatinine 1.16, potassium 4.2, AST 21, ALT 10, cholesterol 191, triglycerides 64, HDL 52, LDL 124, hemoglobin A1c 5.7%, TSH 2.15  Other Studies Reviewed Today:  Echocardiogram 09/20/2019:  1. Left ventricular ejection fraction, by estimation, is 60 to 65%. The  left ventricle has normal function. The left ventricle has no regional  wall motion abnormalities. There is mild left ventricular hypertrophy.  Left ventricular diastolic parameters  are indeterminate.   2. Right ventricular systolic function is normal. The right ventricular  size is normal. There is mildly elevated pulmonary artery systolic  pressure.   3. The mitral valve is normal in structure. Mild mitral valve  regurgitation. No evidence of mitral stenosis.   4. The aortic valve has an indeterminant number of cusps.  Aortic valve  regurgitation is mild. Mild to moderate aortic valve stenosis. Aortic  valve mean gradient measures 12.7 mmHg. Aortic valve peak gradient  measures 21.2 mmHg. Aortic valve area, by VTI   measures 1.43 cm.   5. The inferior vena cava is normal in size with greater than 50%  respiratory variability, suggesting right atrial pressure of 3 mmHg.   Assessment and Plan:  1.  Mild to moderate calcific aortic stenosis, previous mean gradient 13 mmHg..  We will obtain a follow-up echocardiogram for surveillance in comparison to study from August of last year.  2.  CAD status post BMS to the LAD in 2002.  She does not describe any angina symptoms, ECG is stable.  Continue with observation at this time.  She is on Toprol-XL, low-dose aspirin, and Pravachol.  3.  Essential hypertension, blood pressure elevated per review of serial numbers in the chart.  Increase losartan to 75 mg daily for now.  4.  History of recurrent DVT and previous pulmonary embolus.  She is on Coumadin with follow-up in the anticoagulation clinic.  Medication Adjustments/Labs and Tests Ordered: Current medicines are reviewed at length with the patient today.  Concerns regarding medicines are outlined above.   Tests Ordered: Orders Placed This Encounter  Procedures   ECHOCARDIOGRAM COMPLETE     Medication Changes: Meds ordered this encounter  Medications   losartan (COZAAR) 50 MG tablet    Sig: Take 1.5 tablets (75 mg total) by mouth daily.    Dispense:  45 tablet    Refill:  6    Dose increased 10/17/20     Disposition:  Follow up  6 months.  Signed, Satira Sark, MD, [facility] 10/17/2020 3:59 PM    [facility] [place], [place], [place] [address] Phone: [phone]; Fax: [phone]

## 2020-10-17 NOTE — Patient Instructions (Addendum)
Medication Instructions:  Increased Losartan 75 mg daily  Labwork: none  Testing/Procedures: Your physician has requested that you have an echocardiogram. Echocardiography is a painless test that uses sound waves to create images of your heart. It provides your doctor with information about the size and shape of your heart and how well your heart's chambers and valves are working. This procedure takes approximately one hour. There are no restrictions for this procedure.   Follow-Up: Your physician recommends that you schedule a follow-up appointment in: 6 months   Any Other Special Instructions Will Be Listed Below (If Applicable).  If you need a refill on your cardiac medications before your next appointment, please call your pharmacy.

## 2020-11-03 ENCOUNTER — Ambulatory Visit: Payer: Medicare Other | Admitting: *Deleted

## 2020-11-03 DIAGNOSIS — Z5181 Encounter for therapeutic drug level monitoring: Secondary | ICD-10-CM

## 2020-11-03 DIAGNOSIS — I4891 Unspecified atrial fibrillation: Secondary | ICD-10-CM | POA: Diagnosis not present

## 2020-11-03 LAB — POCT INR: INR: 4.2 — AB (ref 2.0–3.0)

## 2020-11-03 NOTE — Patient Instructions (Signed)
Hold warfarin tonight then decrease dose to 1 tablet daily except 1/2 tablet on Tuesdays and Fridays Recheck in 2 weeks

## 2020-11-13 ENCOUNTER — Ambulatory Visit (HOSPITAL_COMMUNITY)
Admission: RE | Admit: 2020-11-13 | Discharge: 2020-11-13 | Disposition: A | Discharge status: Home / Self Care | Payer: Medicare Other | Source: Ambulatory Visit | Attending: Cardiology | Admitting: Cardiology

## 2020-11-13 ENCOUNTER — Other Ambulatory Visit: Payer: Self-pay

## 2020-11-13 DIAGNOSIS — I350 Nonrheumatic aortic (valve) stenosis: Secondary | ICD-10-CM | POA: Insufficient documentation

## 2020-11-13 LAB — ECHOCARDIOGRAM COMPLETE
AR max vel: 0.9 cm2
AV Area VTI: 1.01 cm2
AV Area mean vel: 1 cm2
AV Mean grad: 18 mmHg
AV Peak grad: 33.2 mmHg
Ao pk vel: 2.88 m/s
Area-P 1/2: 2.54 cm2
P 1/2 time: 339 msec
S' Lateral: 2.5 cm

## 2020-11-13 NOTE — Progress Notes (Signed)
*  PRELIMINARY RESULTS* Echocardiogram 2D Echocardiogram has been performed.  Samuel Germany 11/13/2020, 12:33 PM

## 2020-11-14 ENCOUNTER — Telehealth: Payer: Self-pay | Admitting: *Deleted

## 2020-11-14 NOTE — Telephone Encounter (Signed)
-----   Message from Satira Sark, MD sent at 11/13/2020  3:03 PM EDT ----- Results reviewed.  LVEF remains normal at 60 to 65%.  Aortic stenosis moderate to severe range compared to last testing.  Keep scheduled follow-up in 6 months for review of symptoms

## 2020-11-20 ENCOUNTER — Ambulatory Visit: Payer: Medicare Other | Admitting: *Deleted

## 2020-11-20 ENCOUNTER — Other Ambulatory Visit: Payer: Self-pay

## 2020-11-20 DIAGNOSIS — Z5181 Encounter for therapeutic drug level monitoring: Secondary | ICD-10-CM

## 2020-11-20 DIAGNOSIS — I4891 Unspecified atrial fibrillation: Secondary | ICD-10-CM | POA: Diagnosis not present

## 2020-11-20 LAB — POCT INR: INR: 2.7 (ref 2.0–3.0)

## 2020-11-20 NOTE — Patient Instructions (Signed)
Description   Continue to take 1 tablet daily except 1/2 tablet on Tuesdays and Fridays Recheck in 3 weeks

## 2020-11-25 ENCOUNTER — Encounter: Payer: Self-pay | Admitting: *Deleted

## 2020-11-25 NOTE — Telephone Encounter (Signed)
Letter mailed

## 2020-12-11 ENCOUNTER — Ambulatory Visit: Payer: Medicare Other | Admitting: *Deleted

## 2020-12-11 DIAGNOSIS — I4891 Unspecified atrial fibrillation: Secondary | ICD-10-CM | POA: Diagnosis not present

## 2020-12-11 DIAGNOSIS — Z5181 Encounter for therapeutic drug level monitoring: Secondary | ICD-10-CM | POA: Diagnosis not present

## 2020-12-11 LAB — POCT INR: INR: 2.1 (ref 2.0–3.0)

## 2020-12-11 NOTE — Patient Instructions (Signed)
Continue to take 1 tablet daily except 1/2 tablet on Tuesdays and Fridays Recheck in 4 weeks

## 2021-01-08 ENCOUNTER — Ambulatory Visit: Payer: Medicare Other | Admitting: *Deleted

## 2021-01-08 DIAGNOSIS — Z5181 Encounter for therapeutic drug level monitoring: Secondary | ICD-10-CM | POA: Diagnosis not present

## 2021-01-08 DIAGNOSIS — I4891 Unspecified atrial fibrillation: Secondary | ICD-10-CM

## 2021-01-08 LAB — POCT INR: INR: 3.3 — AB (ref 2.0–3.0)

## 2021-01-08 NOTE — Patient Instructions (Signed)
Hold warfarin tonight then resume 1 tablet daily except 1/2 tablet on Tuesdays and Fridays Recheck in 4 weeks

## 2021-01-12 ENCOUNTER — Ambulatory Visit
Admission: EM | Admit: 2021-01-12 | Discharge: 2021-01-12 | Disposition: A | Discharge status: Home / Self Care | Payer: Medicare Other | Attending: Urgent Care | Admitting: Urgent Care

## 2021-01-12 ENCOUNTER — Other Ambulatory Visit: Payer: Self-pay

## 2021-01-12 DIAGNOSIS — L03116 Cellulitis of left lower limb: Secondary | ICD-10-CM | POA: Diagnosis not present

## 2021-01-12 DIAGNOSIS — M79662 Pain in left lower leg: Secondary | ICD-10-CM | POA: Diagnosis not present

## 2021-01-12 DIAGNOSIS — Z86718 Personal history of other venous thrombosis and embolism: Secondary | ICD-10-CM

## 2021-01-12 DIAGNOSIS — Z86711 Personal history of pulmonary embolism: Secondary | ICD-10-CM | POA: Diagnosis not present

## 2021-01-12 DIAGNOSIS — Z7901 Long term (current) use of anticoagulants: Secondary | ICD-10-CM

## 2021-01-12 DIAGNOSIS — M7989 Other specified soft tissue disorders: Secondary | ICD-10-CM

## 2021-01-12 MED ORDER — CEPHALEXIN 500 MG PO CAPS: 500.0000 mg | ORAL_CAPSULE | Freq: Three times a day (TID) | ORAL | 0 refills | Status: DC

## 2021-01-12 NOTE — ED Provider Notes (Addendum)
[place]   MRN: [id number] DOB: [birth year]  Subjective:   Danielle Murphy is a 80 y.o. female presenting for acute onset this morning of left lower leg swelling, redness, pain.  Has a history of pulmonary embolism and DVT.  She is on chronic anticoagulation with warfarin.  Gets regular follow-up with her cardiovascular physician.  Reports that she has not had a DVT in many years.  Denies any particular fall, trauma, itching.  States that she simply woke up with the symptoms.  No chest pain, shortness of breath, fevers.  Patient reports that it really has been harder until being in our clinic.  No current facility-administered medications for this encounter.  Current Outpatient Medications:    acetaminophen (TYLENOL) 500 MG tablet, Take 500 mg by mouth every 6 (six) hours as needed., Disp: , Rfl:    aspirin 81 MG tablet, Take 81 mg by mouth every morning., Disp: , Rfl:    losartan (COZAAR) 50 MG tablet, Take 1.5 tablets (75 mg total) by mouth daily., Disp: 45 tablet, Rfl: 6   metoprolol succinate (TOPROL-XL) 50 MG 24 hr tablet, TAKE 1 & 1/2 TABLETS BY MOUTH (75 MG) DAILY., Disp: 135 tablet, Rfl: 3   pravastatin (PRAVACHOL) 20 MG tablet, TAKE 1 TABLET BY MOUTH EVERY DAY, Disp: 90 tablet, Rfl: 0   warfarin (COUMADIN) 4 MG tablet, TAKE 1 TABLET EVERY DAY  EXCEPT TAKE 1/2 TABLET  ON  MONDAYS, Disp: 90 tablet, Rfl: 3   Allergies  Allergen Reactions   Aspirin Other (See Comments)    Takes 81mg  but has a childhood history of oral swelling with higher doses. Currently taking Coumadin   Niacin Other (See Comments)    Muscle tightness   Ramipril Other (See Comments)    Unknown reaction   Simvastatin Other (See Comments)    Cramping and muscle tightness    Past Medical History:  Diagnosis Date   Aortic stenosis    Coronary atherosclerosis of native coronary artery    BMS LAD 2002   DVT (deep venous thrombosis) (HCC)    Essential hypertension    Hyperlipidemia    Pericardial  cyst    Status post excision 2002   Pulmonary embolism (HCC)      Past Surgical History:  Procedure Laterality Date   Pericardial cyst excision  2002    Family History  Problem Relation Age of Onset   Stroke Mother    Cancer Father     Social History   Tobacco Use   Smoking status: Never   Smokeless tobacco: Never  Vaping Use   Vaping Use: Never used  Substance Use Topics   Alcohol use: No    Alcohol/week: 0.0 standard drinks   Drug use: No    ROS   Objective:   Vitals: BP (!) 122/58 (BP Location: Right Arm)    Pulse 85    Temp 98.9 F (37.2 C) (Oral)    Resp (!) 22    SpO2 95%   Physical Exam Constitutional:      General: She is not in acute distress.    Appearance: Normal appearance. She is well-developed. She is not ill-appearing, toxic-appearing or diaphoretic.  HENT:     Head: Normocephalic and atraumatic.     Nose: Nose normal.     Mouth/Throat:     Mouth: Mucous membranes are moist.     Pharynx: Oropharynx is clear.  Eyes:     General: No scleral icterus.  Right eye: No discharge.        Left eye: No discharge.     Extraocular Movements: Extraocular movements intact.     Conjunctiva/sclera: Conjunctivae normal.     Pupils: Pupils are equal, round, and reactive to light.  Cardiovascular:     Rate and Rhythm: Normal rate.  Pulmonary:     Effort: Pulmonary effort is normal.  Skin:    General: Skin is warm and dry.     Findings: Erythema (about the anterior left lower leg with associated erythema, warmth, swelling) and rash present.  Neurological:     General: No focal deficit present.     Mental Status: She is alert and oriented to person, place, and time.  Psychiatric:        Mood and Affect: Mood normal.        Behavior: Behavior normal.        Thought Content: Thought content normal.        Judgment: Judgment normal.           Assessment and Plan :   PDMP not reviewed this encounter.  1. Cellulitis of left lower extremity    2. Pain and swelling of left lower leg   3. History of pulmonary embolism   4. History of DVT (deep vein thrombosis)   5. Chronic anticoagulation    Case discussed with Dr. Lanny Cramp. Considering early vasculitis versus cellulitis.  Will cover for the latter with Keflex, use Tylenol for pain.  Recheck with PCP or with [place] in 2 days.  Maintain all medications.  Will defer imaging given lack of trauma or suspicion for fracture.  Counseled patient on potential for adverse effects with medications prescribed/recommended today, ER and return-to-clinic precautions discussed, patient verbalized understanding.     Jaynee Eagles, PA-C 01/12/21 1217

## 2021-01-12 NOTE — ED Triage Notes (Signed)
Pt reports redness, swelling and pain in the lower left leg x 2 hours.

## 2021-01-12 NOTE — Discharge Instructions (Signed)
Do not use any nonsteroidal anti-inflammatories (NSAIDs) like ibuprofen, Motrin, naproxen, Aleve, etc. which are all available over-the-counter.  Please just use Tylenol at a dose of 500mg -650mg  once every 6 hours as needed for your aches, pains, fevers.  Start taking the antibiotic Keflex to address an infection of the soft tissue of your skin of the left lower leg called cellulitis.  Please come back in 2 days for recheck or set up a follow-up with your PCP.

## 2021-01-14 ENCOUNTER — Other Ambulatory Visit: Payer: Self-pay

## 2021-01-14 ENCOUNTER — Ambulatory Visit
Admission: EM | Admit: 2021-01-14 | Discharge: 2021-01-14 | Disposition: A | Discharge status: Home / Self Care | Payer: Medicare Other | Attending: Family Medicine | Admitting: Family Medicine

## 2021-01-14 DIAGNOSIS — L03116 Cellulitis of left lower limb: Secondary | ICD-10-CM

## 2021-01-14 DIAGNOSIS — R600 Localized edema: Secondary | ICD-10-CM | POA: Diagnosis not present

## 2021-01-14 MED ORDER — FUROSEMIDE 40 MG PO TABS: 40.0000 mg | ORAL_TABLET | Freq: Every day | ORAL | 0 refills | Status: DC

## 2021-01-14 MED ORDER — SULFAMETHOXAZOLE-TRIMETHOPRIM 800-160 MG PO TABS: 1.0000 | ORAL_TABLET | Freq: Two times a day (BID) | ORAL | 0 refills | Status: AC

## 2021-01-14 NOTE — ED Triage Notes (Signed)
Patient states she was told to come back to recheck her left leg for the pain, swelling and redness.   Patient states her pain level has went down from a 8 to a 5 but the swelling and redness is still happening.

## 2021-01-14 NOTE — ED Provider Notes (Signed)
[facility]    CSN: [id number] Arrival date & time: 01/14/21  0946      History   Chief Complaint Chief Complaint  Patient presents with   Leg Pain    Left leg pain    HPI META KROENKE is a 80 y.o. female.   Patient presenting today following up on left leg redness, pain evaluated 3 days ago in this urgent care.  She was diagnosed with cellulitis of the left lower leg and started on Keflex.  She states her pain has decreased from an 8 to a 5 out of 10 and the redness may be slightly less but the swelling has persisted and she is now draining clear fluid from portions of her lower leg.  She still having diffuse swelling as well.  She denies fever, chills, body aches, decreased range of motion, numbness, tingling, known injury to the area, chest pain, shortness of breath, dizziness.  Does have a history of DVT and PE on chronic anticoagulation with warfarin.  Has not had an event in many years and no bleeding or bruising issues, INR is followed closely by PCP.   Past Medical History:  Diagnosis Date   Aortic stenosis    Coronary atherosclerosis of native coronary artery    BMS LAD 2002   DVT (deep venous thrombosis) (HCC)    Essential hypertension    Hyperlipidemia    Pericardial cyst    Status post excision 2002   Pulmonary embolism [facility])     Patient Active Problem List   Diagnosis Date Noted   Encounter for therapeutic drug monitoring 03/08/2013   Pulmonary embolus ([place]) 10/14/2010   Long term current use of anticoagulant 04/21/2010   CORONARY ATHEROSCLEROSIS NATIVE CORONARY ARTERY 09/03/2009   Aortic valve disorders 07/24/2008   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    Home Medications    Prior to Admission medications    Medication Sig Start Date End Date Taking? Authorizing Provider  furosemide (LASIX) 40 MG tablet Take 1 tablet (40 mg total) by mouth daily. 01/14/21  Yes Volney American, PA-C  sulfamethoxazole-trimethoprim (BACTRIM DS) 800-160 MG tablet Take 1 tablet by mouth 2 (two) times daily for 7 days. 01/14/21 01/21/21 Yes Volney American, PA-C  acetaminophen (TYLENOL) 500 MG tablet Take 500 mg by mouth every 6 (six) hours as needed.    [provider]  aspirin 81 MG tablet Take 81 mg by mouth every morning.    [provider]  losartan (COZAAR) 50 MG tablet Take 1.5 tablets (75 mg total) by mouth daily. 10/17/20   Satira Sark, MD  metoprolol succinate (TOPROL-XL) 50 MG 24 hr tablet TAKE 1 & 1/2 TABLETS BY MOUTH (75 MG) DAILY. 10/06/20   Satira Sark, MD  pravastatin (PRAVACHOL) 20 MG tablet TAKE 1 TABLET BY MOUTH EVERY DAY 06/09/20   Satira Sark, MD  warfarin (COUMADIN) 4 MG tablet TAKE 1 TABLET EVERY DAY  EXCEPT TAKE 1/2 TABLET  ON  MONDAYS 10/06/20   Satira Sark, MD    Family History Family History  Problem Relation Age of Onset   Stroke Mother    Cancer Father     Social History Social History   Tobacco Use  Smoking status: Never   Smokeless tobacco: Never  Vaping Use   Vaping Use: Never used  Substance Use Topics   Alcohol use: No    Alcohol/week: 0.0 standard drinks   Drug use: No     Allergies   Aspirin, Niacin, Ramipril, and Simvastatin   Review of Systems Review of Systems Per HPI  Physical Exam Triage Vital Signs ED Triage Vitals [01/14/21 1002]  Enc Vitals Group     BP (!) 150/73     Pulse Rate 80     Resp 16     Temp 98.3 F (36.8 C)     Temp Source Oral     SpO2 94 %     Weight      Height      Head Circumference      Peak Flow      Pain Score 5     Pain Loc      Pain Edu?      Excl. in [place]?    No data found.  Updated Vital Signs BP (!) 150/73 (BP Location: Right Arm)    Pulse 80    Temp 98.3  F (36.8 C) (Oral)    Resp 16    SpO2 94%   Visual Acuity Right Eye Distance:   Left Eye Distance:   Bilateral Distance:    Right Eye Near:   Left Eye Near:    Bilateral Near:     Physical Exam Vitals and nursing note reviewed.  Constitutional:      Appearance: Normal appearance. She is not ill-appearing.  HENT:     Head: Atraumatic.     Mouth/Throat:     Mouth: Mucous membranes are moist.  Eyes:     Extraocular Movements: Extraocular movements intact.     Conjunctiva/sclera: Conjunctivae normal.  Cardiovascular:     Rate and Rhythm: Normal rate and regular rhythm.     Heart sounds: Normal heart sounds.  Pulmonary:     Effort: Pulmonary effort is normal.     Breath sounds: Normal breath sounds.  Musculoskeletal:        General: Swelling and tenderness present. Normal range of motion.     Cervical back: Normal range of motion and neck supple.     Comments: 2+ edema diffusely left lower extremity below the knee, tender to palpation, erythematous, warm diffusely.  Skin:    General: Skin is warm.     Findings: Erythema present.     Comments: Serous drainage from portions of the lower left leg  Neurological:     Mental Status: She is alert and oriented to person, place, and time.     Comments: Left lower extremity neurovascularly intact  Psychiatric:        Mood and Affect: Mood normal.        Thought Content: Thought content normal.        Judgment: Judgment normal.     UC Treatments / Results  Labs (all labs ordered are listed, but only abnormal results are displayed) Labs Reviewed  CBC WITH DIFFERENTIAL/PLATELET  BASIC METABOLIC PANEL    EKG   Radiology No results found.  Procedures Procedures (including critical care time)  Medications Ordered in UC Medications - No data to display  Initial Impression / Assessment and Plan / UC Course  I have reviewed the triage vital signs and the nursing notes.  Pertinent labs & imaging results that were  available during my care of the patient were reviewed by me  and considered in my medical decision making (see chart for details).     Vital signs benign and reassuring today, patient states subjectively her symptoms are somewhat improved but based on the pictures uploaded in the chart 3 days ago the redness appears more diffuse and is now extending diffusely to just below the knee.  The area is still significantly edematous and warm to the touch as well.  We will switch from Keflex to Bactrim and start Lasix x7 days to help with the significant edema to the limb.  Discussed compression stocking, elevation.  CBC and BMP for safety check.  Discussed to closely follow-up with PCP to check INR with antibiotic use and ED if symptoms progressing despite the medication change.  Final Clinical Impressions(s) / UC Diagnoses   Final diagnoses:  Leg edema, left  Cellulitis of leg, left     Discharge Instructions      Follow-up closely with your primary care provider to make sure that your INR is stable with these antibiotics that we have been prescribing.  They can affect your INR significantly so you will need to have your levels checked very soon.  Go to the emergency department if your leg continues to worsen despite the medications that we are starting today.  We will be in touch with your blood work if anything needs to change based on these.  Elevate your leg and wear compression stockings when able.    ED Prescriptions     Medication Sig Dispense Auth. Provider   sulfamethoxazole-trimethoprim (BACTRIM DS) 800-160 MG tablet Take 1 tablet by mouth 2 (two) times daily for 7 days. 14 tablet Volney American, [place]   furosemide (LASIX) 40 MG tablet Take 1 tablet (40 mg total) by mouth daily. 7 tablet Volney American, [place]      PDMP not reviewed this encounter.   Volney American, [place] 01/14/21 1052

## 2021-01-14 NOTE — Discharge Instructions (Signed)
Follow-up closely with your primary care provider to make sure that your INR is stable with these antibiotics that we have been prescribing.  They can affect your INR significantly so you will need to have your levels checked very soon.  Go to the emergency department if your leg continues to worsen despite the medications that we are starting today.  We will be in touch with your blood work if anything needs to change based on these.  Elevate your leg and wear compression stockings when able.

## 2021-01-15 ENCOUNTER — Other Ambulatory Visit: Payer: Self-pay | Admitting: Nurse Practitioner

## 2021-01-15 ENCOUNTER — Ambulatory Visit (HOSPITAL_COMMUNITY)
Admission: RE | Admit: 2021-01-15 | Discharge: 2021-01-15 | Disposition: A | Discharge status: Home / Self Care | Payer: Medicare Other | Source: Ambulatory Visit | Attending: Nurse Practitioner | Admitting: Nurse Practitioner

## 2021-01-15 ENCOUNTER — Other Ambulatory Visit (HOSPITAL_COMMUNITY): Payer: Self-pay | Admitting: Nurse Practitioner

## 2021-01-15 ENCOUNTER — Telehealth: Payer: Self-pay | Admitting: Cardiology

## 2021-01-15 DIAGNOSIS — Z86718 Personal history of other venous thrombosis and embolism: Secondary | ICD-10-CM

## 2021-01-15 DIAGNOSIS — M79605 Pain in left leg: Secondary | ICD-10-CM

## 2021-01-15 LAB — CBC WITH DIFFERENTIAL/PLATELET
Basophils Absolute: 0 10*3/uL (ref 0.0–0.2)
Basos: 0 %
EOS (ABSOLUTE): 0 10*3/uL (ref 0.0–0.4)
Eos: 0 %
Hematocrit: 31.9 % — ABNORMAL LOW (ref 34.0–46.6)
Hemoglobin: 10.8 g/dL — ABNORMAL LOW (ref 11.1–15.9)
Immature Grans (Abs): 0 10*3/uL (ref 0.0–0.1)
Immature Granulocytes: 0 %
Lymphocytes Absolute: 1 10*3/uL (ref 0.7–3.1)
Lymphs: 13 %
MCH: 29.5 pg (ref 26.6–33.0)
MCHC: 33.9 g/dL (ref 31.5–35.7)
MCV: 87 fL (ref 79–97)
Monocytes Absolute: 0.7 10*3/uL (ref 0.1–0.9)
Monocytes: 9 %
Neutrophils Absolute: 6.1 10*3/uL (ref 1.4–7.0)
Neutrophils: 78 %
Platelets: 213 10*3/uL (ref 150–450)
RBC: 3.66 x10E6/uL — ABNORMAL LOW (ref 3.77–5.28)
RDW: 12.7 % (ref 11.7–15.4)
WBC: 7.9 10*3/uL (ref 3.4–10.8)

## 2021-01-15 LAB — BASIC METABOLIC PANEL
BUN/Creatinine Ratio: 22 (ref 12–28)
BUN: 20 mg/dL (ref 8–27)
CO2: 23 mmol/L (ref 20–29)
Calcium: 8.6 mg/dL — ABNORMAL LOW (ref 8.7–10.3)
Chloride: 104 mmol/L (ref 96–106)
Creatinine, Ser: 0.93 mg/dL (ref 0.57–1.00)
Glucose: 100 mg/dL — ABNORMAL HIGH (ref 70–99)
Potassium: 3.8 mmol/L (ref 3.5–5.2)
Sodium: 141 mmol/L (ref 134–144)
eGFR: 62 mL/min/{1.73_m2} (ref >59)

## 2021-01-15 NOTE — Telephone Encounter (Signed)
New Message:    Son says patient is on a new medicine and will need to get a Warfarin check next week, because of the new medicine. Lost connection withmy phone and computer. I did not get the name of the new medicine.

## 2021-01-16 NOTE — Telephone Encounter (Signed)
Spoke to DIL who stated that pt has an infection in her legs and was prescribed Sulfamethoxazole and furosemide d/t legs being swollen as well. Pt's PCP [facility] told pt/pt DIL that she will need sooner INR check d/t antibiotic causing "blood issues".   Pt scheduled to see Edrick Oh, RN in [place] office on 01/21/2021 @11 :30 am

## 2021-01-21 ENCOUNTER — Ambulatory Visit: Payer: Medicare Other | Admitting: *Deleted

## 2021-01-21 ENCOUNTER — Other Ambulatory Visit: Payer: Self-pay

## 2021-01-21 ENCOUNTER — Other Ambulatory Visit (HOSPITAL_COMMUNITY)
Admission: RE | Admit: 2021-01-21 | Discharge: 2021-01-21 | Disposition: A | Discharge status: Home / Self Care | Payer: Medicare Other | Source: Ambulatory Visit | Attending: Cardiology | Admitting: Cardiology

## 2021-01-21 DIAGNOSIS — Z5181 Encounter for therapeutic drug level monitoring: Secondary | ICD-10-CM

## 2021-01-21 DIAGNOSIS — Z79899 Other long term (current) drug therapy: Secondary | ICD-10-CM | POA: Diagnosis not present

## 2021-01-21 DIAGNOSIS — I4891 Unspecified atrial fibrillation: Secondary | ICD-10-CM

## 2021-01-21 LAB — POCT INR: INR: 8 — AB (ref 2.0–3.0)

## 2021-01-21 LAB — PROTIME-INR
INR: 9 (ref 0.8–1.2)
Prothrombin Time: 73.3 seconds — ABNORMAL HIGH (ref 11.4–15.2)

## 2021-01-21 NOTE — Patient Instructions (Addendum)
Pt has been on Bactrim DS BID since 2/21 POC INR >8.0   STAT INR @ [facility] 9.0 Hold warfarin until INR check on 01/28/20 Pt denies S/S of bleeding.  Bleeding and fall precautions discussed with pt and she verbalized understanding.  Pt knows to report to ED if any symptoms arise.

## 2021-01-27 ENCOUNTER — Ambulatory Visit: Payer: Medicare Other | Admitting: *Deleted

## 2021-01-27 DIAGNOSIS — Z5181 Encounter for therapeutic drug level monitoring: Secondary | ICD-10-CM

## 2021-01-27 DIAGNOSIS — I4891 Unspecified atrial fibrillation: Secondary | ICD-10-CM | POA: Diagnosis not present

## 2021-01-27 LAB — POCT INR: INR: 1.1 — AB (ref 2.0–3.0)

## 2021-01-27 NOTE — Patient Instructions (Signed)
Take warfarin 1 tablet tonight the resume 1 tablet daily except 1/2 tablet on Tuesdays and Fridays Recheck INR in 2 wks

## 2021-01-29 ENCOUNTER — Telehealth: Payer: Self-pay | Admitting: Orthopaedic Surgery

## 2021-01-29 NOTE — Telephone Encounter (Signed)
Voice message received from [place] - not on file as contact for patient; however, she confirmed main designated contact, patient's son Ray - I called and left message in response to request for appointment with  Dr Luna Glasgow."For leg".

## 2021-01-30 ENCOUNTER — Other Ambulatory Visit: Payer: Self-pay | Admitting: Cardiology

## 2021-01-30 NOTE — Telephone Encounter (Signed)
Reached patient; also received referral from primary care; patient and primary care provider's office aware of appointment.

## 2021-02-03 ENCOUNTER — Other Ambulatory Visit: Payer: Self-pay

## 2021-02-03 ENCOUNTER — Ambulatory Visit: Payer: Medicare Other | Admitting: Orthopaedic Surgery

## 2021-02-03 ENCOUNTER — Ambulatory Visit: Payer: Medicare Other

## 2021-02-03 ENCOUNTER — Encounter: Payer: Self-pay | Admitting: Orthopaedic Surgery

## 2021-02-03 VITALS — BP 154/75 | HR 82 | Ht 61.0 in | Wt 106.0 lb

## 2021-02-03 DIAGNOSIS — L03116 Cellulitis of left lower limb: Secondary | ICD-10-CM

## 2021-02-03 DIAGNOSIS — M25572 Pain in left ankle and joints of left foot: Secondary | ICD-10-CM | POA: Diagnosis not present

## 2021-02-03 DIAGNOSIS — Z7901 Long term (current) use of anticoagulants: Secondary | ICD-10-CM | POA: Diagnosis not present

## 2021-02-03 MED ORDER — DOXYCYCLINE HYCLATE 100 MG PO TABS: 50.0000 mg | ORAL_TABLET | Freq: Two times a day (BID) | ORAL | 0 refills | Status: DC

## 2021-02-03 NOTE — Progress Notes (Signed)
Subjective:    Patient ID: Danielle Murphy, female    DOB: 1940/10/25, 81 y.o.   MRN: [id number]  HPI She has had cellulitis of the left lower extremity since the week before Christmas.  She went to Urgent Care on 01-15-21 and was given Septra.  She took this daily but she is also on Coumadin. When seen at the [facility] on 01-31-20 and subsequently her protimes were markedly elevated and it was thought to be from the antibiotic.  She has been off the antibiotic since then.  She is getting more redness now and has developed skin changes medially proximal to the ankle joint that may be early ulceration changes.  She had surgery on the medial ankle many years ago and has screws in place.  Her daughter accompanies her.  I have reviewed the notes from Urgent Care and [facility].  She is not allergic to tetracycline or its products.  She has no trauma.  She does not know how she got the cellulitis.  She has no fever or chills.  She has no swelling of the knee on the left or the foot.  She has had pulmonary embolus in the past.  Review of Systems  Constitutional:  Positive for activity change.  Musculoskeletal:  Positive for arthralgias and myalgias.  All other systems reviewed and are negative. For Review of Systems, all other systems reviewed and are negative.  The following is a summary of the past history medically, past history surgically, known current medicines, social history and family history.  This information is gathered electronically by the computer from prior information and documentation.  I review this each visit and have found including this information at this point in the chart is beneficial and informative.   Past Medical History:  Diagnosis Date   Aortic stenosis    Coronary atherosclerosis of native coronary artery    BMS LAD 2002   DVT (deep venous thrombosis) (HCC)    Essential hypertension    Hyperlipidemia    Pericardial cyst    Status post excision 2002    Pulmonary embolism [facility])     Past Surgical History:  Procedure Laterality Date   Pericardial cyst excision  2002    Current Outpatient Medications on File Prior to Visit  Medication Sig Dispense Refill   acetaminophen (TYLENOL) 500 MG tablet Take 500 mg by mouth every 6 (six) hours as needed.     aspirin 81 MG tablet Take 81 mg by mouth every morning.     furosemide (LASIX) 40 MG tablet Take 1 tablet (40 mg total) by mouth daily. 7 tablet 0   losartan (COZAAR) 50 MG tablet Take 1.5 tablets (75 mg total) by mouth daily. 45 tablet 6   metoprolol succinate (TOPROL-XL) 50 MG 24 hr tablet TAKE 1 & 1/2 TABLETS BY MOUTH (75 MG) DAILY. 135 tablet 3   pravastatin (PRAVACHOL) 20 MG tablet TAKE 1 TABLET BY MOUTH EVERY DAY 90 tablet 1   warfarin (COUMADIN) 4 MG tablet TAKE 1 TABLET EVERY DAY  EXCEPT TAKE 1/2 TABLET  ON  MONDAYS 90 tablet 3   No current facility-administered medications on file prior to visit.    Social History   Socioeconomic History   Marital status: Married    Spouse name: Not on file   Number of children: Not on file   Years of education: Not on file   Highest education level: Not on file  Occupational History   Occupation: Retired  Tobacco  Use   Smoking status: Never   Smokeless tobacco: Never  Vaping Use   Vaping Use: Never used  Substance and Sexual Activity   Alcohol use: No    Alcohol/week: 0.0 standard drinks   Drug use: No   Sexual activity: Not Currently  Other Topics Concern   Not on file  Social History Narrative   Not on file   Social Determinants of Health   Financial Resource Strain: Not on file  Food Insecurity: Not on file  Transportation Needs: Not on file  Physical Activity: Not on file  Stress: Not on file  Social Connections: Not on file  Intimate Partner Violence: Not on file    Family History  Problem Relation Age of Onset   Stroke Mother    Cancer Father     BP (!) 154/75    Pulse 82    Ht 5\' 1"  (1.549 m)    Wt 106 lb  (48.1 kg)    BMI 20.03 kg/m   Body mass index is 20.03 kg/m.     Objective:   Physical Exam Vitals and nursing note reviewed. Exam conducted with a chaperone present.  Constitutional:      Appearance: She is well-developed.  HENT:     Head: Normocephalic and atraumatic.  Eyes:     Conjunctiva/sclera: Conjunctivae normal.     Pupils: Pupils are equal, round, and reactive to light.  Cardiovascular:     Rate and Rhythm: Normal rate and regular rhythm.  Pulmonary:     Effort: Pulmonary effort is normal.  Abdominal:     Palpations: Abdomen is soft.  Musculoskeletal:     Cervical back: Normal range of motion and neck supple.       Legs:  Skin:    General: Skin is warm and dry.  Neurological:     Mental Status: She is alert and oriented to person, place, and time.     Cranial Nerves: No cranial nerve deficit.     Motor: No abnormal muscle tone.     Coordination: Coordination normal.     Deep Tendon Reflexes: Reflexes are normal and symmetric. Reflexes normal.  Psychiatric:        Behavior: Behavior normal.        Thought Content: Thought content normal.        Judgment: Judgment normal.  X-rays were done of the left ankle, reported separately.         Assessment & Plan:   Encounter Diagnoses  Name Primary?   Pain in left ankle and joints of left foot Yes   Cellulitis of left lower leg    Long term current use of anticoagulant    I will give doxycycline samples, 100 mgm.  Take one-half tablet twice a day.  Return in two days, Thursday, for re-evaluation.  If it gets worse, go to the ER.  She may need IV antibiotics.  Do not "pick at" the crusty lesion.  Call if any problem.  Precautions discussed.  Electronically Signed Sanjuana Kava, MD 1/10/202310:59 AM

## 2021-02-03 NOTE — Patient Instructions (Addendum)
Take the doxycycline one half twice daily Do not drink milk 1 hour before or 2 hours after taking the medicine Avoid sunlight also while on the doxycycline, it could give you a rash  Get the blood work done to check her INR/ coumadin levels Let us know what her goal level is

## 2021-02-05 ENCOUNTER — Ambulatory Visit: Payer: Medicare Other | Admitting: *Deleted

## 2021-02-05 ENCOUNTER — Ambulatory Visit: Payer: Medicare Other | Admitting: Orthopaedic Surgery

## 2021-02-05 ENCOUNTER — Encounter: Payer: Self-pay | Admitting: Orthopaedic Surgery

## 2021-02-05 ENCOUNTER — Other Ambulatory Visit: Payer: Self-pay

## 2021-02-05 ENCOUNTER — Telehealth: Payer: Self-pay | Admitting: Orthopaedic Surgery

## 2021-02-05 DIAGNOSIS — I4891 Unspecified atrial fibrillation: Secondary | ICD-10-CM

## 2021-02-05 DIAGNOSIS — Z5181 Encounter for therapeutic drug level monitoring: Secondary | ICD-10-CM

## 2021-02-05 DIAGNOSIS — L03116 Cellulitis of left lower limb: Secondary | ICD-10-CM

## 2021-02-05 DIAGNOSIS — Z7901 Long term (current) use of anticoagulants: Secondary | ICD-10-CM

## 2021-02-05 LAB — POCT INR: INR: 2 (ref 2.0–3.0)

## 2021-02-05 NOTE — Progress Notes (Signed)
The medicine seems to be working.  She has been on the doxycyline for the last two days.  She has no new problem, no drainage, no side effects.  She is to get blood work to check protime today.  The leg has redness lower leg but not bright, even less than the other day.  She has small skin lesion medially that looks less prominent.  NV intact.  She has no blisters.  Encounter Diagnoses  Name Primary?   Cellulitis of left lower leg Yes   Long term current use of anticoagulant    Continue the antibiotic.  Return Tuesday.  Call if any problem.  Precautions discussed.  Electronically Signed Sanjuana Kava, MD 1/12/202310:07 AM

## 2021-02-05 NOTE — Telephone Encounter (Signed)
Called back to patient to relay; no answer; called designated party contact Helene Kelp; left message to return call.

## 2021-02-05 NOTE — Patient Instructions (Signed)
On doxycycline 50mg  twice daily till she sees Dr Luna Glasgow Tuesday 1/17. Continue warfarin 1 tablet tonight the resume 1 tablet daily except 1/2 tablet on Tuesdays and Fridays Recheck INR in1 wks

## 2021-02-05 NOTE — Telephone Encounter (Signed)
Patient / designated contact Danielle Murphy called to relay - may run out of antibiotic by Saturday:   doxycycline (VIBRA-TABS) 100 MG tablet 10 tablet       [facility], Linna Hoff

## 2021-02-10 ENCOUNTER — Ambulatory Visit: Payer: Medicare Other | Admitting: Orthopaedic Surgery

## 2021-02-11 ENCOUNTER — Ambulatory Visit: Payer: Medicare Other | Admitting: *Deleted

## 2021-02-11 DIAGNOSIS — I4891 Unspecified atrial fibrillation: Secondary | ICD-10-CM

## 2021-02-11 DIAGNOSIS — Z5181 Encounter for therapeutic drug level monitoring: Secondary | ICD-10-CM

## 2021-02-11 LAB — POCT INR: INR: 3.8 — AB (ref 2.0–3.0)

## 2021-02-11 NOTE — Patient Instructions (Signed)
Has 1 pill left of doxycycline.  Has f/u appt re. Cellulitis of leg on Friday.  Pt to call if restarted on Abx. Hold warfarin tonight then decrease dose to 1 tablet daily except 1/2 tablet on Tuesdays, Thursdays and Saturdays Recheck INR in1 wk

## 2021-02-19 ENCOUNTER — Ambulatory Visit: Payer: Medicare Other | Admitting: *Deleted

## 2021-02-19 DIAGNOSIS — I4891 Unspecified atrial fibrillation: Secondary | ICD-10-CM | POA: Diagnosis not present

## 2021-02-19 DIAGNOSIS — Z5181 Encounter for therapeutic drug level monitoring: Secondary | ICD-10-CM

## 2021-02-19 LAB — POCT INR: INR: 1.5 — AB (ref 2.0–3.0)

## 2021-02-19 NOTE — Patient Instructions (Signed)
Take warfarin 1 1/2 tablets tonight then increase dose to 1 tablet daily except 1/2 tablet on Tuesdays and Saturdays Recheck INR 2 wks

## 2021-03-05 ENCOUNTER — Ambulatory Visit: Payer: Medicare Other | Admitting: *Deleted

## 2021-03-05 DIAGNOSIS — I4891 Unspecified atrial fibrillation: Secondary | ICD-10-CM

## 2021-03-05 DIAGNOSIS — Z5181 Encounter for therapeutic drug level monitoring: Secondary | ICD-10-CM | POA: Diagnosis not present

## 2021-03-05 LAB — POCT INR: INR: 3.4 — AB (ref 2.0–3.0)

## 2021-03-05 NOTE — Patient Instructions (Signed)
Description    Hold warfarin today and then continue to take 1 tablet daily except 1/2 tablet on Tuesdays and Saturdays Recheck INR 2 wks

## 2021-03-19 ENCOUNTER — Ambulatory Visit: Payer: Medicare Other | Admitting: *Deleted

## 2021-03-19 DIAGNOSIS — I4891 Unspecified atrial fibrillation: Secondary | ICD-10-CM | POA: Diagnosis not present

## 2021-03-19 DIAGNOSIS — Z5181 Encounter for therapeutic drug level monitoring: Secondary | ICD-10-CM | POA: Diagnosis not present

## 2021-03-19 LAB — POCT INR: INR: 2 (ref 2.0–3.0)

## 2021-03-19 NOTE — Patient Instructions (Signed)
Continue warfarin 1 tablet daily except 1/2 tablet on Tuesdays and Saturdays Recheck INR 4 wks

## 2021-03-24 NOTE — Progress Notes (Signed)
? ? ?Cardiology Office Note ? ?Date: 03/25/2021  ? ?ID: GORGEOUS Murphy, DOB [birth year], MRN [id number] ? ?PCP:  [name], FNP  ?Cardiologist:  [name], MD ?Electrophysiologist:  None  ? ?Chief Complaint  ?Patient presents with  ? Cardiac follow-up  ? ? ?History of Present Illness: ?Danielle Murphy is an 81 y.o. female last seen in September 2022.  She is here for a follow-up visit.  She does not report any major change in stamina.  Still lives in her own home, as a 16 year old son with Down syndrome that lives with her and helps with chores also.  She does not describe any angina, NYHA class II dyspnea, no dizziness or syncope. ? ?She remains on Coumadin with follow-up in the anticoagulation clinic, history of recurrent DVT and prior pulmonary embolus.  Recent INR 2.0.  No spontaneous bleeding problems. ? ?Follow-up echocardiogram in October 2022 revealed moderate to severe aortic stenosis with mild to moderate aortic regurgitation.  Mean AV gradient 18 mmHg and dimensionless index 0.36. ? ?I reviewed her medications which are noted below.  She has not had a recent lipid panel. ? ?Past Medical History:  ?Diagnosis Date  ? Aortic stenosis   ? Coronary atherosclerosis of native coronary artery   ? BMS LAD 2002  ? DVT (deep venous thrombosis) ([place])   ? Essential hypertension   ? Hyperlipidemia   ? 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 Pulmonary embolism ([place])   ? ? ?Past Surgical History:  ?Procedure Laterality Date  ? Pericardial cyst excision  2002  ? ? ?Current Outpatient Medications  ?Medication Sig Dispense Refill  ? acetaminophen (TYLENOL) 500 MG tablet Take 500 mg by mouth every 6 (six) hours as needed.    ? aspirin 81 MG tablet Take 81 mg by mouth every morning.    ? furosemide (LASIX) 40 MG tablet Take 1 tablet (40 mg total) by mouth daily. 7 tablet 0  ? losartan (COZAAR) 50 MG tablet Take 1.5 tablets (75 mg total) by mouth daily. 45 tablet 6  ? metoprolol succinate (TOPROL-XL) 50 MG 24 hr  tablet TAKE 1 & 1/2 TABLETS BY MOUTH (75 MG) DAILY. 135 tablet 3  ? pravastatin (PRAVACHOL) 20 MG tablet TAKE 1 TABLET BY MOUTH EVERY DAY 90 tablet 1  ? warfarin (COUMADIN) 4 MG tablet TAKE 1 TABLET EVERY DAY  EXCEPT TAKE 1/2 TABLET  ON  MONDAYS 90 tablet 3  ? ?No current facility-administered medications for this visit.  ? ?Allergies:  Aspirin, Niacin, Ramipril, Simvastatin, and Sulfa antibiotics  ? ?ROS: No palpitations or syncope. ? ?Physical Exam: ?VS:  BP (!) 160/58   Pulse 67   Ht 5\' 1"  (1.549 m)   Wt 105 lb 3.2 oz (47.7 kg)   SpO2 99%   BMI 19.88 kg/m? , BMI Body mass index is 19.88 kg/m?. ? ?Wt Readings from Last 3 Encounters:  ?03/25/21 105 lb 3.2 oz (47.7 kg)  ?02/03/21 106 lb (48.1 kg)  ?10/17/20 105 lb 9.6 oz (47.9 kg)  ?  ?General: Patient appears comfortable at rest. ?HEENT: Conjunctiva and lids normal, wearing a mask. ?Neck: Supple, no elevated JVP or carotid bruits, no thyromegaly. ?Lungs: Clear to auscultation, nonlabored breathing at rest. ?Cardiac: Regular rate and rhythm, no S3, 3/6 systolic murmur. ?Abdomen: Soft, nontender, bowel sounds present. ?Extremities: No pitting edema. ? ?ECG:  An ECG dated 10/17/2020 was personally reviewed today and demonstrated:  Sinus rhythm with prolonged PR interval. ? ?Recent Labwork: ?  01/14/2021: BUN 20; Creatinine, Ser 0.93; Hemoglobin 10.8; Platelets 213; Potassium 3.8; Sodium 141  ? ?Other Studies Reviewed Today: ? ?Echocardiogram 11/13/2020: ? 1. Left ventricular ejection fraction, by estimation, is 60 to 65%. The  ?left ventricle has normal function. The left ventricle has no regional  ?wall motion abnormalities. There is mild left ventricular hypertrophy.  ?Left ventricular diastolic parameters  ?are consistent with Grade II diastolic dysfunction (pseudonormalization).  ? 2. Right ventricular systolic function is normal. The right ventricular  ?size is normal. There is normal pulmonary artery systolic pressure. The  ?estimated right ventricular  systolic pressure is 57.3 mmHg.  ? 3. The mitral valve is abnormal. Mild mitral valve regurgitation.  ? 4. The aortic valve is tricuspid. There is moderate calcification of the  ?aortic valve. Aortic valve regurgitation is mild to moderate. Moderate to  ?severe aortic valve stenosis. Aortic regurgitation PHT measures 339 msec.  ?Aortic valve area, by VTI  ?measures 1.01 cm?Danielle Murphy Aortic valve mean gradient measures 18.0 mmHg.  ?Dimentionless index 0.36.  ? 5. The inferior vena cava is normal in size with greater than 50%  ?respiratory variability, suggesting right atrial pressure of 3 mmHg.  ? ?Assessment and Plan: ? ?1.  CAD status post BMS to the LAD in 2002.  No active angina at this time on medical therapy.  Continue aspirin, Toprol-XL, losartan, and Pravachol. ? ?2.  Mixed hyperlipidemia on Pravachol.  She has had prior intolerances to niacin and Zocor.  Plan to check FLP. ? ?3.  Calcific aortic stenosis, echocardiogram in October 2022 was consistent with moderate to severe stenosis with mean gradient 18 mmHg and dimensionless index 0.36.  We will plan a follow-up echocardiogram in April.  We did discuss possibility of TAVR referral if necessary. ? ?Medication Adjustments/Labs and Tests Ordered: ?Current medicines are reviewed at length with the patient today.  Concerns regarding medicines are outlined above.  ? ?Tests Ordered: ?No orders of the defined types were placed in this encounter. ? ? ?Medication Changes: ?No orders of the defined types were placed in this encounter. ? ? ?Disposition:  Follow up  6 months. ? ?Signed, ?Satira Sark, MD, [facility] ?03/25/2021 11:35 AM    ?[place] [facility] ?[place], [place], [place] [address] ?Phone: [phone]; Fax: [phone]  ?

## 2021-03-25 ENCOUNTER — Ambulatory Visit: Payer: Medicare Other | Admitting: Cardiology

## 2021-03-25 ENCOUNTER — Encounter: Payer: Self-pay | Admitting: *Deleted

## 2021-03-25 ENCOUNTER — Encounter: Payer: Self-pay | Admitting: Cardiology

## 2021-03-25 VITALS — BP 160/58 | HR 67 | Ht 61.0 in | Wt 105.2 lb

## 2021-03-25 DIAGNOSIS — I25119 Atherosclerotic heart disease of native coronary artery with unspecified angina pectoris: Secondary | ICD-10-CM | POA: Diagnosis not present

## 2021-03-25 DIAGNOSIS — E782 Mixed hyperlipidemia: Secondary | ICD-10-CM | POA: Diagnosis not present

## 2021-03-25 DIAGNOSIS — I350 Nonrheumatic aortic (valve) stenosis: Secondary | ICD-10-CM | POA: Diagnosis not present

## 2021-03-25 NOTE — Patient Instructions (Signed)
Medication Instructions:  ?Your physician recommends that you continue on your current medications as directed. Please refer to the Current Medication list given to you today.  ? ?Labwork: ?Fasting Lipid Panel ? ?Testing/Procedures: ?Your physician has requested that you have an echocardiogram. Echocardiography is a painless test that uses sound waves to create images of your heart. It provides your doctor with information about the size and shape of your heart and how well your heart?s chambers and valves are working. This procedure takes approximately one hour. There are no restrictions for this procedure.  ? ?Follow-Up: ?Your physician recommends that you schedule a follow-up appointment in: 6 months ? ?Any Other Special Instructions Will Be Listed Below (If Applicable). ? ?If you need a refill on your cardiac medications before your next appointment, please call your pharmacy. ? ?

## 2021-03-31 LAB — LIPID PANEL
Chol/HDL Ratio: 3.9 ratio (ref 0.0–4.4)
Cholesterol, Total: 215 mg/dL — ABNORMAL HIGH (ref 100–199)
HDL: 55 mg/dL (ref >39)
LDL Chol Calc (NIH): 135 mg/dL — ABNORMAL HIGH (ref 0–99)
Triglycerides: 138 mg/dL (ref 0–149)
VLDL Cholesterol Cal: 25 mg/dL (ref 5–40)

## 2021-04-01 ENCOUNTER — Telehealth: Payer: Self-pay | Admitting: Cardiology

## 2021-04-01 MED ORDER — ROSUVASTATIN CALCIUM 5 MG PO TABS: 5.0000 mg | ORAL_TABLET | Freq: Every day | ORAL | 3 refills | Status: DC

## 2021-04-01 NOTE — Telephone Encounter (Signed)
Patient informed and verbalized understanding of plan. Copy sent to PCP 

## 2021-04-01 NOTE — Telephone Encounter (Signed)
Patient returned call for her lab results. °

## 2021-04-01 NOTE — Telephone Encounter (Signed)
-----   Message from Satira Sark, MD sent at 03/31/2021  8:12 AM EST ----- ?Results reviewed.  LDL 135 on Pravachol.  Would see if she would be willing to switch to Crestor starting at 5 mg daily.  If she tolerates this we can uptitrate from there to get her cholesterol under better control. ?

## 2021-04-16 ENCOUNTER — Ambulatory Visit: Payer: Medicare Other | Admitting: *Deleted

## 2021-04-16 DIAGNOSIS — Z5181 Encounter for therapeutic drug level monitoring: Secondary | ICD-10-CM | POA: Diagnosis not present

## 2021-04-16 DIAGNOSIS — I4891 Unspecified atrial fibrillation: Secondary | ICD-10-CM | POA: Diagnosis not present

## 2021-04-16 LAB — POCT INR: INR: 2.9 (ref 2.0–3.0)

## 2021-04-16 NOTE — Patient Instructions (Signed)
Description   ?Continue warfarin 1 tablet daily except 1/2 tablet on Tuesdays and Saturdays ?Recheck INR 5 wks ? ? ? ?  ? ? ?

## 2021-04-27 ENCOUNTER — Other Ambulatory Visit: Payer: Self-pay | Admitting: Cardiology

## 2021-04-28 ENCOUNTER — Other Ambulatory Visit: Payer: Medicare Other

## 2021-05-21 ENCOUNTER — Ambulatory Visit (INDEPENDENT_AMBULATORY_CARE_PROVIDER_SITE_OTHER): Payer: Medicare Other

## 2021-05-21 ENCOUNTER — Ambulatory Visit: Payer: Medicare Other | Admitting: *Deleted

## 2021-05-21 DIAGNOSIS — Z5181 Encounter for therapeutic drug level monitoring: Secondary | ICD-10-CM

## 2021-05-21 DIAGNOSIS — I350 Nonrheumatic aortic (valve) stenosis: Secondary | ICD-10-CM

## 2021-05-21 DIAGNOSIS — I4891 Unspecified atrial fibrillation: Secondary | ICD-10-CM

## 2021-05-21 LAB — POCT INR: INR: 3.3 — AB (ref 2.0–3.0)

## 2021-05-21 LAB — ECHOCARDIOGRAM COMPLETE
AR max vel: 1.15 cm2
AV Area VTI: 1.08 cm2
AV Area mean vel: 1.08 cm2
AV Mean grad: 18.6 mmHg
AV Peak grad: 34.2 mmHg
AV Vena cont: 0.38 cm
Ao pk vel: 2.92 m/s
Area-P 1/2: 2.94 cm2
Calc EF: 69.3 %
MV M vel: 4.8 m/s
MV Peak grad: 92.2 mmHg
P 1/2 time: 496 msec
S' Lateral: 2.44 cm
Single Plane A2C EF: 69.2 %
Single Plane A4C EF: 69.6 %

## 2021-05-21 NOTE — Patient Instructions (Signed)
Hold warfarin tonight then resume 1 tablet daily except 1/2 tablet on Tuesdays and Saturdays ?Increase greens ?Recheck INR 4 wks ?

## 2021-06-09 ENCOUNTER — Encounter: Payer: Self-pay | Admitting: *Deleted

## 2021-06-18 ENCOUNTER — Ambulatory Visit (INDEPENDENT_AMBULATORY_CARE_PROVIDER_SITE_OTHER): Payer: Medicare Other | Admitting: *Deleted

## 2021-06-18 DIAGNOSIS — I4891 Unspecified atrial fibrillation: Secondary | ICD-10-CM

## 2021-06-18 DIAGNOSIS — Z5181 Encounter for therapeutic drug level monitoring: Secondary | ICD-10-CM

## 2021-06-18 LAB — POCT INR: INR: 3.7 — AB (ref 2.0–3.0)

## 2021-06-18 NOTE — Patient Instructions (Signed)
Description   Hold warfarin today Then START taking warfarin 1 tablet daily except for 1/2 a tablet on Tuesday, Thursday and Saturday. Recheck INR in 3 weeks.

## 2021-07-09 ENCOUNTER — Ambulatory Visit: Payer: Medicare Other | Admitting: *Deleted

## 2021-07-09 DIAGNOSIS — Z5181 Encounter for therapeutic drug level monitoring: Secondary | ICD-10-CM | POA: Diagnosis not present

## 2021-07-09 DIAGNOSIS — I4891 Unspecified atrial fibrillation: Secondary | ICD-10-CM

## 2021-07-09 LAB — POCT INR: INR: 2.2 (ref 2.0–3.0)

## 2021-07-09 NOTE — Patient Instructions (Signed)
Continue warfarin 1 tablet daily except for 1/2 a tablet on Tuesday, Thursday and Saturday. Recheck INR in 4 weeks.

## 2021-08-06 ENCOUNTER — Ambulatory Visit: Payer: Medicare Other | Admitting: *Deleted

## 2021-08-06 DIAGNOSIS — I4891 Unspecified atrial fibrillation: Secondary | ICD-10-CM | POA: Diagnosis not present

## 2021-08-06 DIAGNOSIS — Z5181 Encounter for therapeutic drug level monitoring: Secondary | ICD-10-CM

## 2021-08-06 LAB — POCT INR: INR: 4 — AB (ref 2.0–3.0)

## 2021-08-06 NOTE — Patient Instructions (Signed)
Hold warfarin tonight then decrease dose to 1/2 tablet daily except 1 tablet on Sundays, Tuesdays and Thursdays Recheck INR in 3 weeks.

## 2021-08-27 ENCOUNTER — Ambulatory Visit (INDEPENDENT_AMBULATORY_CARE_PROVIDER_SITE_OTHER): Payer: Medicare Other | Admitting: *Deleted

## 2021-08-27 DIAGNOSIS — Z5181 Encounter for therapeutic drug level monitoring: Secondary | ICD-10-CM | POA: Diagnosis not present

## 2021-08-27 DIAGNOSIS — I4891 Unspecified atrial fibrillation: Secondary | ICD-10-CM

## 2021-08-27 LAB — POCT INR: INR: 3.2 — AB (ref 2.0–3.0)

## 2021-08-27 NOTE — Patient Instructions (Signed)
Decrease warfarin to 1/2 tablet daily except 1 tablet on Sundays and Wednesdays Recheck INR in 4 weeks.

## 2021-09-24 ENCOUNTER — Ambulatory Visit: Payer: Medicare Other | Attending: Cardiology | Admitting: *Deleted

## 2021-09-24 DIAGNOSIS — Z5181 Encounter for therapeutic drug level monitoring: Secondary | ICD-10-CM

## 2021-09-24 DIAGNOSIS — I4891 Unspecified atrial fibrillation: Secondary | ICD-10-CM

## 2021-09-24 LAB — POCT INR: INR: 2.1 (ref 2.0–3.0)

## 2021-09-24 NOTE — Patient Instructions (Signed)
Continue warfarin 1/2 tablet daily except 1 tablet on Sundays and Wednesdays Recheck INR in 4 weeks.

## 2021-09-28 ENCOUNTER — Other Ambulatory Visit: Payer: Self-pay | Admitting: Cardiology

## 2021-09-30 NOTE — Progress Notes (Signed)
Cardiology Office Note  Date: 10/01/2021   ID: Danielle Murphy, DOB 06-15-1940, MRN [id number]  PCP:  [name], FNP  Cardiologist:  [name], MD Electrophysiologist:  None   Chief Complaint  Patient presents with   Cardiac follow-up    History of Present Illness: Danielle Murphy is an 81 y.o. female last seen in March.  She is here for a follow-up visit.  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  She works in her yard as well.  Reports NYHA class II dyspnea.  No dizziness or syncope.  She continues on Coumadin with follow-up in the anticoagulation his clinic, history of recurrent DVT and prior pulmonary embolus.  Follow-up echocardiogram in April revealed LVEF 55 to 60%, normal estimated PASP, mild mitral regurgitation, and moderate to severe aortic stenosis with mean gradient 19 mmHg and dimensionless index 0.34.  We switched her from Pravachol to Crestor since last visit, previous LDL up to 135.  She is tolerating this so far.  I personally reviewed her ECG which shows sinus rhythm with prolonged PR interval.  Blood pressure better on my recheck today.  It sounds like her average systolic is in the 301S at home.  I reviewed her medications.  Past Medical History:  Diagnosis Date   Aortic stenosis    Coronary atherosclerosis of native coronary artery    BMS LAD 2002   DVT (deep venous thrombosis) ([facility])    Essential hypertension    Hyperlipidemia    Pericardial cyst    Status post excision 2002   Pulmonary embolism [facility])     Past Surgical History:  Procedure Laterality Date   Pericardial cyst excision  2002    Current Outpatient Medications  Medication Sig Dispense Refill   acetaminophen (TYLENOL) 500 MG tablet Take 500 mg by mouth every 6 (six) hours as needed.     aspirin 81 MG tablet Take 81 mg by mouth every morning.     furosemide (LASIX) 40 MG tablet Take 1 tablet (40 mg total)  by mouth daily. 7 tablet 0   losartan (COZAAR) 50 MG tablet TAKE 1 AND A HALF TABLETS BY MOUTH EVERY DAY 135 tablet 2   metoprolol succinate (TOPROL-XL) 50 MG 24 hr tablet TAKE 1 & 1/2 TABLETS BY MOUTH (75 MG) DAILY. 135 tablet 3   rosuvastatin (CRESTOR) 5 MG tablet Take 1 tablet (5 mg total) by mouth daily. 90 tablet 3   warfarin (COUMADIN) 4 MG tablet TAKE 1 TABLET EVERY DAY  EXCEPT TAKE 1/2 TABLET  ON  MONDAYS 90 tablet 3   No current facility-administered medications for this visit.   Allergies:  Aspirin, Niacin, Ramipril, Simvastatin, and Sulfa antibiotics   ROS: No palpitations.  No orthopnea or PND.  Physical Exam: VS:  BP 122/60 (BP Location: Left Arm)   Pulse (!) 58   Ht '5\' 1"'$  (1.549 m)   Wt 104 lb 9.6 oz (47.4 kg)   SpO2 99%   BMI 19.76 kg/m , BMI Body mass index is 19.76 kg/m.  Wt Readings from Last 3 Encounters:  10/01/21 104 lb 9.6 oz (47.4 kg)  03/25/21 105 lb 3.2 oz (47.7 kg)  02/03/21 106 lb (48.1 kg)    General: Patient appears comfortable at rest. HEENT: Conjunctiva and lids normal. Neck: Supple, no elevated JVP or carotid bruits, no thyromegaly. Lungs: Clear to auscultation, nonlabored breathing at rest. Cardiac: Regular rate and  rhythm, no S3, 3/6 systolic murmur. Extremities: No pitting edema.  ECG:  An ECG dated 10/17/2020 was personally reviewed today and demonstrated:  Sinus rhythm with prolonged PR interval.  Recent Labwork: 01/14/2021: BUN 20; Creatinine, Ser 0.93; Hemoglobin 10.8; Platelets 213; Potassium 3.8; Sodium 141     Component Value Date/Time   CHOL 215 (H) 03/30/2021 0831   TRIG 138 03/30/2021 0831   HDL 55 03/30/2021 0831   CHOLHDL 3.9 03/30/2021 0831   LDLCALC 135 (H) 03/30/2021 0831    Other Studies Reviewed Today:  Echocardiogram 05/21/2021:  1. Left ventricular ejection fraction, by estimation, is 55 to 60%. The  left ventricle has normal function. The left ventricle has no regional  wall motion abnormalities. Left ventricular  diastolic parameters are  indeterminate. Elevated left atrial  pressure. The average left ventricular global longitudinal strain is -21.4  %. The global longitudinal strain is normal.   2. Right ventricular systolic function is normal. The right ventricular  size is normal. There is normal pulmonary artery systolic pressure.   3. The mitral valve is abnormal. Mild mitral valve regurgitation. No  evidence of mitral stenosis.   4. The tricuspid valve is abnormal. Tricuspid valve regurgitation is mild  to moderate.   5. The aortic valve is tricuspid. There is moderate calcification of the  aortic valve. There is moderate thickening of the aortic valve. Aortic  valve regurgitation is moderate. Moderate aortic valve stenosis. Aortic  valve mean gradient measures 18.6  mmHg. Aortic valve peak gradient measures 34.2 mmHg. Aortic valve area, by  VTI measures 1.08 cm.   6. The inferior vena cava is normal in size with greater than 50%  respiratory variability, suggesting right atrial pressure of 3 mmHg.   Assessment and Plan:  1.  Moderate to severe calcific aortic stenosis.  Echocardiogram in April revealed mean gradient approximately 19 mmHg and dimensionless index 0.36.  She is stable clinically with NYHA class II dyspnea, no angina or syncope with house chores and yard work.  Plan to continue cautious observation for now.  Repeat echocardiogram with clinical visit in 3 months.  I have spoken with her about TAVR referral.  2.  CAD status post BMS to the LAD in 2002.  She does not report any angina at this time.  ECG reviewed.  Continue aspirin, Toprol-XL, Cozaar, and Crestor.  3.  History of recurrent DVT and pulmonary embolus, on chronic Coumadin with follow-up in anticoagulation clinic.  Medication Adjustments/Labs and Tests Ordered: Current medicines are reviewed at length with the patient today.  Concerns regarding medicines are outlined above.   Tests Ordered: Orders Placed This  Encounter  Procedures   EKG 12-Lead   ECHOCARDIOGRAM COMPLETE    Medication Changes: No orders of the defined types were placed in this encounter.   Disposition:  Follow up  3 months.  Signed, Satira Sark, MD, [facility] 10/01/2021 11:17 AM    [place] at [address] [place], [place], [place] [address] Phone: [phone]; Fax: [phone]

## 2021-10-01 ENCOUNTER — Encounter: Payer: Self-pay | Admitting: Cardiology

## 2021-10-01 ENCOUNTER — Ambulatory Visit: Payer: Medicare Other | Attending: Cardiology | Admitting: Cardiology

## 2021-10-01 VITALS — BP 122/60 | HR 58 | Ht 61.0 in | Wt 104.6 lb

## 2021-10-01 DIAGNOSIS — Z86711 Personal history of pulmonary embolism: Secondary | ICD-10-CM

## 2021-10-01 DIAGNOSIS — I25119 Atherosclerotic heart disease of native coronary artery with unspecified angina pectoris: Secondary | ICD-10-CM

## 2021-10-01 DIAGNOSIS — I350 Nonrheumatic aortic (valve) stenosis: Secondary | ICD-10-CM | POA: Diagnosis not present

## 2021-10-01 NOTE — Patient Instructions (Addendum)
Medication Instructions:  Your physician recommends that you continue on your current medications as directed. Please refer to the Current Medication list given to you today.  Labwork: none  Testing/Procedures: Your physician has requested that you have an echocardiogram in 3 months just before your next visit. Echocardiography is a painless test that uses sound waves to create images of your heart. It provides your doctor with information about the size and shape of your heart and how well your heart's chambers and valves are working. This procedure takes approximately one hour. There are no restrictions for this procedure.  Follow-Up: Your physician recommends that you schedule a follow-up appointment in: 3 months  Any Other Special Instructions Will Be Listed Below (If Applicable).  If you need a refill on your cardiac medications before your next appointment, please call your pharmacy.

## 2021-10-24 ENCOUNTER — Encounter: Payer: Self-pay | Admitting: Emergency Medicine

## 2021-10-24 ENCOUNTER — Ambulatory Visit
Admission: EM | Admit: 2021-10-24 | Discharge: 2021-10-24 | Disposition: A | Discharge status: Home / Self Care | Payer: Medicare Other | Attending: Family Medicine | Admitting: Family Medicine

## 2021-10-24 DIAGNOSIS — L03211 Cellulitis of face: Secondary | ICD-10-CM | POA: Diagnosis not present

## 2021-10-24 MED ORDER — MUPIROCIN 2 % EX OINT: 1.0000 | TOPICAL_OINTMENT | Freq: Two times a day (BID) | CUTANEOUS | 0 refills | Status: DC

## 2021-10-24 MED ORDER — CEPHALEXIN 500 MG PO CAPS: 500.0000 mg | ORAL_CAPSULE | Freq: Two times a day (BID) | ORAL | 0 refills | Status: DC

## 2021-10-24 NOTE — ED Provider Notes (Signed)
[facility]    CSN: [id number] Arrival date & time: 10/24/21  1017      History   Chief Complaint No chief complaint on file.   HPI Danielle Murphy is a 81 y.o. female.   Patient presenting today with a large open area to her right nasal bridge.  She states it had come previously but went away on its own, reappeared yesterday and has become significantly worse since onset.  Has been bleeding and draining since onset.  She does not recall injuring herself in any way and has not been cleaning it with anything or putting anything on it.  Denies fever, chills, difficulty breathing through her nose, headaches.    Past Medical History:  Diagnosis Date   Aortic stenosis    Coronary atherosclerosis of native coronary artery    BMS LAD 2002   DVT (deep venous thrombosis) ([facility])    Essential hypertension    Hyperlipidemia    Pericardial cyst    Status post excision 2002   Pulmonary embolism [facility])     Patient Active Problem List   Diagnosis Date Noted   Encounter for therapeutic drug monitoring 03/08/2013   Pulmonary embolus ([place]) 10/14/2010   Long term current use of anticoagulant 04/21/2010   CORONARY ATHEROSCLEROSIS NATIVE CORONARY ARTERY 09/03/2009   Aortic valve disorders 07/24/2008   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      Home Medications    Prior to Admission medications   Medication Sig Start Date End Date Taking? Authorizing Provider  cephALEXin (KEFLEX) 500 MG capsule Take 1 capsule (500 mg total) by mouth 2 (two) times daily. 10/24/21  Yes Volney American, PA-C  mupirocin ointment (BACTROBAN) 2 % Apply 1 Application topically 2 (two) times daily. 10/24/21  Yes Volney American, PA-C  acetaminophen (TYLENOL) 500  MG tablet Take 500 mg by mouth every 6 (six) hours as needed.    [provider]  aspirin 81 MG tablet Take 81 mg by mouth every morning.    [provider]  furosemide (LASIX) 40 MG tablet Take 1 tablet (40 mg total) by mouth daily. 01/14/21   Volney American, PA-C  losartan (COZAAR) 50 MG tablet TAKE 1 AND A HALF TABLETS BY MOUTH EVERY DAY 04/27/21   Satira Sark, MD  metoprolol succinate (TOPROL-XL) 50 MG 24 hr tablet TAKE 1 & 1/2 TABLETS BY MOUTH (75 MG) DAILY. 09/29/21   Satira Sark, MD  rosuvastatin (CRESTOR) 5 MG tablet Take 1 tablet (5 mg total) by mouth daily. 04/01/21   Satira Sark, MD  warfarin (COUMADIN) 4 MG tablet TAKE 1 TABLET EVERY DAY  EXCEPT TAKE 1/2 TABLET  ON  MONDAYS 10/06/20   Satira Sark, MD    Family History Family History  Problem Relation Age of Onset   Stroke Mother    Cancer Father     Social History Social History   Tobacco Use   Smoking status: Never    Passive exposure: Never   Smokeless tobacco: Never  Vaping Use   Vaping Use: Never used  Substance Use Topics   Alcohol use: No    Alcohol/week: 0.0 standard drinks of alcohol   Drug use: No  Allergies   Aspirin, Niacin, Ramipril, Simvastatin, and Sulfa antibiotics   Review of Systems Review of Systems Per HPI  Physical Exam Triage Vital Signs ED Triage Vitals  Enc Vitals Group     BP 10/24/21 1115 (!) 156/62     Pulse Rate 10/24/21 1115 63     Resp 10/24/21 1115 16     Temp 10/24/21 1115 98.5 F (36.9 C)     Temp Source 10/24/21 1115 Oral     SpO2 10/24/21 1115 98 %     Weight --      Height --      Head Circumference --      Peak Flow --      Pain Score 10/24/21 1116 0     Pain Loc --      Pain Edu? --      Excl. in [place]? --    No data found.  Updated Vital Signs BP (!) 156/62 (BP Location: Right Arm)   Pulse 63   Temp 98.5 F (36.9 C) (Oral)   Resp 16   SpO2 98%   Visual Acuity Right Eye Distance:   Left Eye Distance:    Bilateral Distance:    Right Eye Near:   Left Eye Near:    Bilateral Near:     Physical Exam Vitals and nursing note reviewed.  Constitutional:      Appearance: Normal appearance. She is not ill-appearing.  HENT:     Head: Atraumatic.  Eyes:     Extraocular Movements: Extraocular movements intact.     Conjunctiva/sclera: Conjunctivae normal.  Cardiovascular:     Rate and Rhythm: Normal rate and regular rhythm.     Heart sounds: Normal heart sounds.  Pulmonary:     Effort: Pulmonary effort is normal.     Breath sounds: Normal breath sounds.  Musculoskeletal:        General: Normal range of motion.     Cervical back: Normal range of motion and neck supple.  Skin:    General: Skin is warm.     Comments: 1.5 cm open ulcerated region with clear drainage and bleeding to the right side of nasal bridge surrounding erythema, edema.  Bilateral nares patent  Neurological:     Mental Status: She is alert. Mental status is at baseline.  Psychiatric:        Mood and Affect: Mood normal.        Thought Content: Thought content normal.        Judgment: Judgment normal.      UC Treatments / Results  Labs (all labs ordered are listed, but only abnormal results are displayed) Labs Reviewed - No data to display  EKG   Radiology No results found.  Procedures Procedures (including critical care time)  Medications Ordered in UC Medications - No data to display  Initial Impression / Assessment and Plan / UC Course  I have reviewed the triage vital signs and the nursing notes.  Pertinent labs & imaging results that were available during my care of the patient were reviewed by me and considered in my medical decision making (see chart for details).     Given location and rapidly worsening course, will send mupirocin ointment as well as Keflex orally.  Discussed good home wound care, keeping area covered and close follow-up with PCP next week.  She is unfortunately on Coumadin as  well so discussed to call provider managing her INRs first thing Monday morning and let them know she  is not taking an antibiotic so that they can follow her INRs closely.  ER for severe bleeding or bruising issues in the meantime.  Final Clinical Impressions(s) / UC Diagnoses   Final diagnoses:  Facial cellulitis     Discharge Instructions      Keep the area clean, apply the mupirocin ointment once to twice daily and cover with a nonstick bandage.  I have also sent over an oral antibiotic for you to take.  Because you are on Coumadin, you will need to have your INRs monitored closely while taking the antibiotic as this can alter your INR levels.  Call the provider who manages your INR levels Monday morning and let them know you have started an antibiotic.    ED Prescriptions     Medication Sig Dispense Auth. Provider   mupirocin ointment (BACTROBAN) 2 % Apply 1 Application topically 2 (two) times daily. 22 g Volney American, [place]   cephALEXin (KEFLEX) 500 MG capsule Take 1 capsule (500 mg total) by mouth 2 (two) times daily. 14 capsule Volney American, [place]      PDMP not reviewed this encounter.   Volney American, [place] 10/24/21 1214

## 2021-10-24 NOTE — ED Triage Notes (Signed)
Sore on nose since yesterday. States sore has been there before but went away.  When it came back, it became worse.

## 2021-10-24 NOTE — Discharge Instructions (Signed)
Keep the area clean, apply the mupirocin ointment once to twice daily and cover with a nonstick bandage.  I have also sent over an oral antibiotic for you to take.  Because you are on Coumadin, you will need to have your INRs monitored closely while taking the antibiotic as this can alter your INR levels.  Call the provider who manages your INR levels Monday morning and let them know you have started an antibiotic.

## 2021-11-02 ENCOUNTER — Ambulatory Visit: Payer: Medicare Other | Attending: Cardiology | Admitting: *Deleted

## 2021-11-02 DIAGNOSIS — Z5181 Encounter for therapeutic drug level monitoring: Secondary | ICD-10-CM | POA: Diagnosis not present

## 2021-11-02 DIAGNOSIS — I4891 Unspecified atrial fibrillation: Secondary | ICD-10-CM

## 2021-11-02 LAB — POCT INR: INR: 1.8 — AB (ref 2.0–3.0)

## 2021-11-02 NOTE — Patient Instructions (Signed)
Increase warfarin to 1/2 tablet daily except 1 tablet on Mondays, Wednesdays and Fridays Recheck INR in 3 weeks.

## 2021-11-23 ENCOUNTER — Ambulatory Visit: Payer: Medicare Other | Attending: Cardiology | Admitting: *Deleted

## 2021-11-23 DIAGNOSIS — Z5181 Encounter for therapeutic drug level monitoring: Secondary | ICD-10-CM | POA: Diagnosis not present

## 2021-11-23 DIAGNOSIS — I4891 Unspecified atrial fibrillation: Secondary | ICD-10-CM | POA: Diagnosis not present

## 2021-11-23 LAB — POCT INR: INR: 6 — AB (ref 2.0–3.0)

## 2021-11-23 NOTE — Patient Instructions (Addendum)
Hold warfarin x 3 days (Tues, Wed, Thurs) then decrease dose to 1/2 tablet daily except 1 tablet on Mondays and Thursdays Recheck in 1 week Eat extra greens today Bleeding and fall precautions discussed with pt and she verbalized understanding

## 2021-11-30 ENCOUNTER — Ambulatory Visit: Payer: Medicare Other | Attending: Cardiology | Admitting: *Deleted

## 2021-11-30 DIAGNOSIS — I4891 Unspecified atrial fibrillation: Secondary | ICD-10-CM

## 2021-11-30 DIAGNOSIS — Z5181 Encounter for therapeutic drug level monitoring: Secondary | ICD-10-CM | POA: Diagnosis not present

## 2021-11-30 LAB — POCT INR: INR: 1.7 — AB (ref 2.0–3.0)

## 2021-11-30 NOTE — Patient Instructions (Signed)
Start warfarin 1/2 tablet daily except 1 tablet on Mondays, Wednesdays and Fridays Recheck in 3 weeks

## 2021-12-09 ENCOUNTER — Other Ambulatory Visit: Payer: Self-pay | Admitting: Cardiology

## 2021-12-11 ENCOUNTER — Other Ambulatory Visit: Payer: Self-pay | Admitting: Cardiology

## 2021-12-11 ENCOUNTER — Telehealth: Payer: Self-pay | Admitting: Cardiology

## 2021-12-11 NOTE — Telephone Encounter (Signed)
Patient called requesting a refill on pravastatin. Informed patient that during her visit with Dr. Domenic Polite on 3/1, she was to have her lipids checked to see if there needed to be any changes to her cholesterol medication and that she was notified on 3/8 that her pravastatin would be changed to Crestor in which she was agreeable to. Patient states that she does not remember her medication being switched and that she has been on the pravastatin the entire time. States that she is ok with switching to the Crestor if she needs to be on it. Please advise

## 2021-12-11 NOTE — Telephone Encounter (Signed)
*  STAT* If patient is at the pharmacy, call can be transferred to refill team.   1. Which medications need to be refilled? (please list name of each medication and dose if known) Pravastatin   2. Which pharmacy/location (including street and city if local pharmacy) is medication to be sent to?  [id number]- [place], [place] - [address]   3. Do they need a 30 day or 90 day supply? [address]

## 2021-12-14 MED ORDER — ROSUVASTATIN CALCIUM 5 MG PO TABS: 5.0000 mg | ORAL_TABLET | Freq: Every day | ORAL | 3 refills | Status: DC

## 2021-12-14 NOTE — Telephone Encounter (Signed)
Patient made aware. Crestor 5 mg once daily sent to [facility], per patient request.

## 2021-12-19 ENCOUNTER — Other Ambulatory Visit: Payer: Self-pay | Admitting: Cardiology

## 2021-12-21 ENCOUNTER — Ambulatory Visit: Payer: Medicare Other | Attending: Cardiology | Admitting: *Deleted

## 2021-12-21 DIAGNOSIS — Z5181 Encounter for therapeutic drug level monitoring: Secondary | ICD-10-CM

## 2021-12-21 DIAGNOSIS — I4891 Unspecified atrial fibrillation: Secondary | ICD-10-CM

## 2021-12-21 LAB — POCT INR: INR: 2.9 (ref 2.0–3.0)

## 2021-12-21 NOTE — Telephone Encounter (Signed)
Refill request for warfarin:  Last INR was 1.7 on 11/30/21 Next INR due 12/21/21 LOV was 10/01/21  Myles Gip MD  Refill approved.

## 2021-12-21 NOTE — Patient Instructions (Signed)
Continue warfarin 1/2 tablet daily except 1 tablet on Mondays, Wednesdays and Fridays Recheck in 3 weeks

## 2021-12-31 ENCOUNTER — Ambulatory Visit: Payer: Medicare Other | Attending: Cardiology

## 2021-12-31 DIAGNOSIS — I350 Nonrheumatic aortic (valve) stenosis: Secondary | ICD-10-CM

## 2021-12-31 LAB — ECHOCARDIOGRAM COMPLETE
AR max vel: 1.15 cm2
AV Area VTI: 0.97 cm2
AV Area mean vel: 1.17 cm2
AV Mean grad: 17 mmHg
AV Peak grad: 32.7 mmHg
AV Vena cont: 0.4 cm
Ao pk vel: 2.86 m/s
Area-P 1/2: 2.76 cm2
Calc EF: 57.4 %
P 1/2 time: 395 msec
S' Lateral: 2.5 cm
Single Plane A2C EF: 55.2 %
Single Plane A4C EF: 64.7 %

## 2021-12-31 NOTE — Progress Notes (Signed)
Cardiology Office Note:    Date:  01/01/2022   ID:  Danielle Murphy, DOB 1940-03-19, MRN [id number]  PCP:  Danielle Murphy, [facility] Cardiologist:  [name], MD     Referring MD: Danielle Robert, FNP   CC: Here for follow-up  History of Present Illness:    Danielle Murphy is a 81 y.o. female with a hx of the following:  CAD, s/p BMS to LAD in 2002 HTN Hx of DVT/PE HLD Hx of pericardial cyst, s/p excision in 2002 AS  Patient is a 81 year old female with past medical history as mentioned above.  She has been followed at the Coumadin clinic for past history of recurrent DVT and previous PE.  Echocardiogram in April 2023 revealed EF 55 to 60%, mild MR, normal PASP, moderate to severe AS with mean gradient 19 mmHg.  Last seen by Dr. Domenic Polite on October 01, 2021.  Was overall doing well and denied any dizziness or syncope, remained active by working in her yard.  She was tolerating Crestor well.  Was told that echocardiogram would be repeated in the next 46-monthfollow-up.  Dr. MDomenic Politespoke with her about TAVR referral.  Repeat echocardiogram performed yesterday revealed EF 60 to 65%, no RWMA, RVSF mildly reduced, mildly elevated PASP, mild to moderate MR, mild to moderate TR, mild to moderate AR with moderate calcification of aortic valve, severe AS with a low gradient. Today she presents for 356-monthollow-up visit.  She states she is doing great. Denies any CP, shortness of breath, palpitations, syncope, presyncope, dizziness, orthopnea, PND, swelling, significant weight changes, acute bleeding, or claudication.  She stays very active, does yard work, and provides care for her adult son with Down syndrome.  Denies any other questions or concerns today.   Past Medical History:  Diagnosis Date   Aortic stenosis    Coronary atherosclerosis of native coronary artery    BMS LAD 2002   DVT (deep venous thrombosis) (HCC)    Essential hypertension     Hyperlipidemia    Pericardial cyst    Status post excision 2002   [facility]    Past Surgical History:  Procedure Laterality Date   Pericardial cyst excision  2002    Current Medications: Current Meds  Medication Sig   acetaminophen (TYLENOL) 500 MG tablet Take 500 mg by mouth every 6 (six) hours as needed.   furosemide (LASIX) 40 MG tablet Take 1 tablet (40 mg total) by mouth daily.   losartan (COZAAR) 50 MG tablet TAKE 1 AND A HALF TABLETS BY MOUTH EVERY DAY   metoprolol succinate (TOPROL-XL) 50 MG 24 hr tablet TAKE 1 & 1/2 TABLETS BY MOUTH (75 MG) DAILY.   warfarin (COUMADIN) 4 MG tablet Take 1/2 to 1 tablet daily as directed by coumadin clinic.   rosuvastatin (CRESTOR) 5 MG tablet Take 1 tablet (5 mg total) by mouth daily.     Allergies:   Aspirin, Niacin, Ramipril, Simvastatin, and Sulfa antibiotics   Social History   Socioeconomic History   Marital status: Married    Spouse name: Not on file   Number of children: Not on file   Years of education: Not on file   Highest education level: Not on file  Occupational History   Occupation: Retired  Tobacco Use   Smoking status: Never    Passive exposure: Never   Smokeless tobacco: Never  Vaping Use   Vaping Use: Never used  Substance and Sexual  Activity   Alcohol use: No    Alcohol/week: 0.0 standard drinks of alcohol   Drug use: No   Sexual activity: Not Currently  Other Topics Concern   Not on file  Social History Narrative   Not on file   Social Determinants of Health   Financial Resource Strain: Not on file  Food Insecurity: Not on file  Transportation Needs: Not on file  Physical Activity: Not on file  Stress: Not on file  Social Connections: Not on file     Family History: The patient's family history includes Cancer in her father; Stroke in her mother.  ROS:   Review of Systems  Constitutional: Negative.   HENT: Negative.    Eyes: Negative.   Respiratory: Negative.     Cardiovascular: Negative.   Gastrointestinal: Negative.   Genitourinary: Negative.   Musculoskeletal: Negative.   Skin: Negative.   Neurological: Negative.   Endo/Heme/Allergies:  Negative for environmental allergies and polydipsia. Bruises/bleeds easily.  Psychiatric/Behavioral: Negative.      Please see the history of present illness.    All other systems reviewed and are negative.  EKGs/Labs/Other Studies Reviewed:    The following studies were reviewed today:   EKG:  EKG is not ordered today.   2D echocardiogram on December 31, 2021:  1. Left ventricular ejection fraction, by estimation, is 60 to 65%. The  left ventricle has normal function. The left ventricle has no regional  wall motion abnormalities. Left ventricular diastolic parameters are  indeterminate.   2. Right ventricular systolic function is mildly reduced. The right  ventricular size is normal. There is mildly elevated pulmonary artery  systolic pressure.   3. The mitral valve is abnormal. Mild to moderate mitral valve  regurgitation. No evidence of mitral stenosis.   4. The tricuspid valve is abnormal. Tricuspid valve regurgitation is mild  to moderate.   5. The aortic valve is calcified. There is moderate calcification of the  aortic valve. There is mild thickening of the aortic valve. Aortic valve  regurgitation is mild to moderate. Normal flow-low gradient severe aortic  valve stenosis with aortic valve  area (by planimetry and continuity equation) is 0.97 cm2, V max 2.86 m/s,  mean PG 17 mm Hg and SVI 44 ml/m2.   6. The inferior vena cava is normal in size with greater than 50%  respiratory variability, suggesting right atrial pressure of 3 mmHg.   Comparison(s): Prior images reviewed side by side. Changes from prior  study are noted. Aortic stenosis progressed from moderate to severe  (normal flow-low gradient) and stable mild to moderate aortic  regurgitation. Stable LVEF.  Myoview on September 30, 2016: There was no ST segment deviation noted during stress. This is a low risk study. The left ventricular ejection fraction is hyperdynamic (>65%). 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  Recent Labs: 01/14/2021: BUN 20; Creatinine, Ser 0.93; Hemoglobin 10.8; Platelets 213; Potassium 3.8; Sodium 141  Recent Lipid Panel    Component Value Date/Time   CHOL 215 (H) 03/30/2021 0831   TRIG 138 03/30/2021 0831   HDL 55 03/30/2021 0831   CHOLHDL 3.9 03/30/2021 0831   LDLCALC 135 (H) 03/30/2021 0831    Physical Exam:    VS:  BP (!) 164/58   Pulse 60   Ht '5\' 1"'$  (1.549 m)   Wt 112 lb 9.6 oz (51.1 kg)   SpO2 98%   BMI 21.28 kg/m     Wt Readings from  Last 3 Encounters:  01/01/22 112 lb 9.6 oz (51.1 kg)  10/01/21 104 lb 9.6 oz (47.4 kg)  03/25/21 105 lb 3.2 oz (47.7 kg)     GEN: Thin, frail 81 y.o. female in no acute distress HEENT: Normal NECK: No JVD; No carotid bruits CARDIAC: S1/S2, RRR, Grade 3/6 systolic murmur heard throughout with radiation to carotids, no rubs or gallops noted.  2+ peripheral pulses throughout, strong and equal bilaterally RESPIRATORY:  Clear and diminished to auscultation without rales, wheezing or rhonchi  MUSCULOSKELETAL:  No edema; thoracic kyphosis noted, otherwise no other deformity noted SKIN: Thin skin, warm and dry, some scattered bruises along bilateral arms  NEUROLOGIC:  Alert and oriented x 3 PSYCHIATRIC:  Normal affect   ASSESSMENT:    1. Coronary artery disease involving native coronary artery of native heart without angina pectoris   2. Hypertension, unspecified type   3. Mixed hyperlipidemia   4. Medication management   5. Nonrheumatic aortic valve stenosis   6. History of DVT (deep vein thrombosis)   7. History of pulmonary embolus (PE)   8. Valvular insufficiency    PLAN:    In order of problems listed above:  CAD, s/p BMS to LAD in 2002 Stable with no anginal  symptoms. No indication for ischemic evaluation.  Continue Toprol-XL and losartan.  Will increase Crestor as mentioned below.  She is due for labs to be drawn so we will obtain CBC and BMET. Heart healthy diet and regular cardiovascular exercise encouraged. Will route note to Dr. Domenic Polite to see if ASA can be d/c.   Hypertension Blood pressure today elevated. Discussed to monitor BP at home at least 2 hours after medications and sitting for 5-10 minutes.  Discussed to log BP and let us know if consistently elevated SBP greater than 140.  Continue current medication regimen. Heart healthy diet and regular cardiovascular exercise encouraged.  Will obtain TSH.   Hyperlipidemia, medication management Labs from March 2023 revealed total cholesterol 215, HDL 55, LDL 135, triglycerides 138.  Will increase Crestor to 10 mg daily and repeat FLP and LFT in 2 months.  Heart healthy diet and regular cardiovascular exercise encouraged.   Aortic stenosis Echo performed yesterday revealed EF 60 to 65% with moderate calcification of aortic valve and mild thickening of aortic valve.  Mild to moderate AR with borderline AS between moderate to severe AS (with most criteria favoring moderate per Dr. Cammy Copa estimates), he was consulted for this visit. He recommended repeating Echo in 6 months as she is asymptomatic at this time and will arrange this for her. ED precautions discussed. Heart healthy diet and regular cardiovascular exercise encouraged.   Addendum 01/06/22: Dr. Domenic Polite notified and looped in Dr. Audie Box who recommended doing aortic valve calcium scores on the coronary calcium score CT and to begin there. This will be arranged for patient.   4.   History of DVT/PE Denies any leg swelling or claudication issues.  Denies any acute bleeding while on Coumadin.  Continue to follow-up with Coumadin clinic as scheduled.  Continue Coumadin dosing as instructed.   5. Valvular insufficiency Echocardiogram  performed yesterday revealed mild to moderate TR, mild to moderate AR, and mild to moderate MR. She is asymptomatic at this time.  Will update TTE in 6 months.  Continue current medication regimen. Heart healthy diet and regular cardiovascular exercise encouraged.     6.  Disposition: Follow-up with Dr. Domenic Polite in 2 to 3 months or sooner if anything changes.  Medication Adjustments/Labs and Tests Ordered: Current medicines are reviewed at length with the patient today.  Concerns regarding medicines are outlined above.  Orders Placed This Encounter  Procedures   TSH   Basic metabolic panel   CBC   Lipid panel   Hepatic function panel   ECHOCARDIOGRAM COMPLETE   Meds ordered this encounter  Medications   rosuvastatin (CRESTOR) 10 MG tablet    Sig: Take 1 tablet (10 mg total) by mouth daily.    Dispense:  90 tablet    Refill:  2    01/01/2022 dose increase    Patient Instructions  Medication Instructions:  Your physician has recommended you make the following change in your medication:  Increase rosuvastatin (crestor) to 10 mg daily Continue other medications the same  Labwork: BMET, CBC & TSH next week at [facility] recommends that you return for a FASTING lipid/liver profile: in 2 months (February 2024). Please do not eat or drink for at least 8 hours when you have this done. You may take your medications that morning with a sip of water. This can be done at [facility].  Testing/Procedures: Your physician has requested that you have an echocardiogram in 6 months (June 2024). Echocardiography is a painless test that uses sound waves to create images of your heart. It provides your doctor with information about the size and shape of your heart and how well your heart's chambers and valves are working. This procedure takes approximately one hour. There are no restrictions for this procedure. Please do NOT wear cologne, perfume, aftershave, or lotions  (deodorant is allowed). Please arrive 15 minutes prior to your appointment time.  Follow-Up: Your physician recommends that you schedule a follow-up appointment in: 3-4 months  Any Other Special Instructions Will Be Listed Below (If Applicable).  If you need a refill on your cardiac medications before your next appointment, please call your pharmacy.   SignedFinis Bud, NP  01/01/2022 11:15 AM    [place]

## 2022-01-01 ENCOUNTER — Encounter: Payer: Self-pay | Admitting: Nurse Practitioner

## 2022-01-01 ENCOUNTER — Ambulatory Visit: Payer: Medicare Other | Attending: Nurse Practitioner | Admitting: Nurse Practitioner

## 2022-01-01 VITALS — BP 164/58 | HR 60 | Ht 61.0 in | Wt 112.6 lb

## 2022-01-01 DIAGNOSIS — I10 Essential (primary) hypertension: Secondary | ICD-10-CM

## 2022-01-01 DIAGNOSIS — I38 Endocarditis, valve unspecified: Secondary | ICD-10-CM

## 2022-01-01 DIAGNOSIS — Z86718 Personal history of other venous thrombosis and embolism: Secondary | ICD-10-CM

## 2022-01-01 DIAGNOSIS — E782 Mixed hyperlipidemia: Secondary | ICD-10-CM

## 2022-01-01 DIAGNOSIS — Z79899 Other long term (current) drug therapy: Secondary | ICD-10-CM

## 2022-01-01 DIAGNOSIS — I350 Nonrheumatic aortic (valve) stenosis: Secondary | ICD-10-CM

## 2022-01-01 DIAGNOSIS — Z86711 Personal history of pulmonary embolism: Secondary | ICD-10-CM

## 2022-01-01 DIAGNOSIS — I2510 Atherosclerotic heart disease of native coronary artery without angina pectoris: Secondary | ICD-10-CM

## 2022-01-01 MED ORDER — ROSUVASTATIN CALCIUM 10 MG PO TABS: 10.0000 mg | ORAL_TABLET | Freq: Every day | ORAL | 2 refills | Status: DC

## 2022-01-01 NOTE — Patient Instructions (Addendum)
Medication Instructions:  Your physician has recommended you make the following change in your medication:  Increase rosuvastatin (crestor) to 10 mg daily Continue other medications the same  Labwork: BMET, CBC & TSH next week at [facility] recommends that you return for a FASTING lipid/liver profile: in 2 months (February 2024). Please do not eat or drink for at least 8 hours when you have this done. You may take your medications that morning with a sip of water. This can be done at [facility].  Testing/Procedures: Your physician has requested that you have an echocardiogram in 6 months (June 2024). Echocardiography is a painless test that uses sound waves to create images of your heart. It provides your doctor with information about the size and shape of your heart and how well your heart's chambers and valves are working. This procedure takes approximately one hour. There are no restrictions for this procedure. Please do NOT wear cologne, perfume, aftershave, or lotions (deodorant is allowed). Please arrive 15 minutes prior to your appointment time.  Follow-Up: Your physician recommends that you schedule a follow-up appointment in: 3-4 months  Any Other Special Instructions Will Be Listed Below (If Applicable).  If you need a refill on your cardiac medications before your next appointment, please call your pharmacy.

## 2022-01-04 ENCOUNTER — Other Ambulatory Visit (HOSPITAL_COMMUNITY): Payer: Self-pay | Admitting: Nurse Practitioner

## 2022-01-04 DIAGNOSIS — I350 Nonrheumatic aortic (valve) stenosis: Secondary | ICD-10-CM

## 2022-01-07 LAB — BASIC METABOLIC PANEL
BUN/Creatinine Ratio: 15 (ref 12–28)
BUN: 19 mg/dL (ref 8–27)
CO2: 25 mmol/L (ref 20–29)
Calcium: 9.3 mg/dL (ref 8.7–10.3)
Chloride: 103 mmol/L (ref 96–106)
Creatinine, Ser: 1.28 mg/dL — ABNORMAL HIGH (ref 0.57–1.00)
Glucose: 97 mg/dL (ref 70–99)
Potassium: 3.9 mmol/L (ref 3.5–5.2)
Sodium: 142 mmol/L (ref 134–144)
eGFR: 42 mL/min/{1.73_m2} — ABNORMAL LOW (ref >59)

## 2022-01-07 LAB — CBC
Hematocrit: 34.5 % (ref 34.0–46.6)
Hemoglobin: 11.6 g/dL (ref 11.1–15.9)
MCH: 31.3 pg (ref 26.6–33.0)
MCHC: 33.6 g/dL (ref 31.5–35.7)
MCV: 93 fL (ref 79–97)
Platelets: 237 10*3/uL (ref 150–450)
RBC: 3.71 x10E6/uL — ABNORMAL LOW (ref 3.77–5.28)
RDW: 12.2 % (ref 11.7–15.4)
WBC: 4.3 10*3/uL (ref 3.4–10.8)

## 2022-01-07 LAB — TSH: TSH: 0.385 u[IU]/mL — ABNORMAL LOW (ref 0.450–4.500)

## 2022-01-11 ENCOUNTER — Ambulatory Visit: Payer: Medicare Other | Attending: Cardiology | Admitting: *Deleted

## 2022-01-11 ENCOUNTER — Telehealth: Payer: Self-pay | Admitting: *Deleted

## 2022-01-11 DIAGNOSIS — I10 Essential (primary) hypertension: Secondary | ICD-10-CM

## 2022-01-11 DIAGNOSIS — Z5181 Encounter for therapeutic drug level monitoring: Secondary | ICD-10-CM | POA: Diagnosis not present

## 2022-01-11 DIAGNOSIS — I4891 Unspecified atrial fibrillation: Secondary | ICD-10-CM | POA: Diagnosis not present

## 2022-01-11 DIAGNOSIS — Z79899 Other long term (current) drug therapy: Secondary | ICD-10-CM

## 2022-01-11 LAB — POCT INR: INR: 2.2 (ref 2.0–3.0)

## 2022-01-11 NOTE — Patient Instructions (Signed)
Continue warfarin 1/2 tablet daily except 1 tablet on Mondays, Wednesdays and Fridays Recheck in 4 weeks

## 2022-01-11 NOTE — Telephone Encounter (Signed)
-----   Message from Finis Bud, NP sent at 01/10/2022  9:42 PM EST ----- Kidney function elevated. TSH low. Otherwise rest of labs are WNL. Have her continue to follow up with her PCP and encourage patient to stay adequately hydrated. Let's plan to repeat BMET in 3 weeks.   Thanks!   Finis Bud, AGNP-C

## 2022-01-11 NOTE — Telephone Encounter (Signed)
Patient informed and verbalized understanding of plan. Copy sent to PCP 

## 2022-02-08 ENCOUNTER — Ambulatory Visit: Payer: Medicare Other | Attending: Cardiology | Admitting: *Deleted

## 2022-02-08 DIAGNOSIS — I4891 Unspecified atrial fibrillation: Secondary | ICD-10-CM

## 2022-02-08 DIAGNOSIS — Z5181 Encounter for therapeutic drug level monitoring: Secondary | ICD-10-CM | POA: Diagnosis not present

## 2022-02-08 LAB — POCT INR: INR: 2.5 (ref 2.0–3.0)

## 2022-02-08 NOTE — Patient Instructions (Signed)
Continue warfarin 1/2 tablet daily except 1 tablet on Mondays, Wednesdays and Fridays Recheck in 6 weeks 

## 2022-02-09 ENCOUNTER — Ambulatory Visit (HOSPITAL_COMMUNITY)
Admission: RE | Admit: 2022-02-09 | Discharge: 2022-02-09 | Disposition: A | Discharge status: Home / Self Care | Payer: Medicare Other | Source: Ambulatory Visit | Attending: Nurse Practitioner | Admitting: Nurse Practitioner

## 2022-02-09 DIAGNOSIS — I350 Nonrheumatic aortic (valve) stenosis: Secondary | ICD-10-CM | POA: Insufficient documentation

## 2022-02-17 ENCOUNTER — Other Ambulatory Visit: Payer: Self-pay | Admitting: Cardiology

## 2022-02-26 ENCOUNTER — Encounter: Payer: Self-pay | Admitting: Nurse Practitioner

## 2022-02-26 LAB — HEPATIC FUNCTION PANEL
ALT: 36 IU/L — ABNORMAL HIGH (ref 0–32)
AST: 44 IU/L — ABNORMAL HIGH (ref 0–40)
Albumin: 4.1 g/dL (ref 3.7–4.7)
Alkaline Phosphatase: 75 IU/L (ref 44–121)
Bilirubin Total: 0.4 mg/dL (ref 0.0–1.2)
Bilirubin, Direct: 0.1 mg/dL (ref 0.00–0.40)
Total Protein: 6.7 g/dL (ref 6.0–8.5)

## 2022-02-26 LAB — LIPID PANEL
Chol/HDL Ratio: 2.8 ratio (ref 0.0–4.4)
Cholesterol, Total: 176 mg/dL (ref 100–199)
HDL: 63 mg/dL (ref >39)
LDL Chol Calc (NIH): 95 mg/dL (ref 0–99)
Triglycerides: 103 mg/dL (ref 0–149)
VLDL Cholesterol Cal: 18 mg/dL (ref 5–40)

## 2022-02-26 LAB — BASIC METABOLIC PANEL
BUN/Creatinine Ratio: 12 (ref 12–28)
BUN: 14 mg/dL (ref 8–27)
CO2: 25 mmol/L (ref 20–29)
Calcium: 9.5 mg/dL (ref 8.7–10.3)
Chloride: 104 mmol/L (ref 96–106)
Creatinine, Ser: 1.15 mg/dL — ABNORMAL HIGH (ref 0.57–1.00)
Glucose: 95 mg/dL (ref 70–99)
Potassium: 4.2 mmol/L (ref 3.5–5.2)
Sodium: 144 mmol/L (ref 134–144)
eGFR: 48 mL/min/{1.73_m2} — ABNORMAL LOW (ref >59)

## 2022-02-26 LAB — TSH: TSH: 0.54 u[IU]/mL (ref 0.450–4.500)

## 2022-03-02 ENCOUNTER — Telehealth: Payer: Self-pay | Admitting: *Deleted

## 2022-03-02 DIAGNOSIS — Z79899 Other long term (current) drug therapy: Secondary | ICD-10-CM

## 2022-03-02 DIAGNOSIS — E782 Mixed hyperlipidemia: Secondary | ICD-10-CM

## 2022-03-02 MED ORDER — ROSUVASTATIN CALCIUM 5 MG PO TABS: 5.0000 mg | ORAL_TABLET | Freq: Every day | ORAL | 3 refills | Status: DC

## 2022-03-02 NOTE — Telephone Encounter (Signed)
Patient informed and verbalized understanding of plan. [facility] order placed Copy sent to PCP

## 2022-03-02 NOTE — Telephone Encounter (Signed)
-----   Message from Finis Bud, NP sent at 02/27/2022  4:28 PM EST ----- Lab work has improved. However, her liver function enzymes are mildly elevated after increasing Crestor. Let's go back down to 5 mg daily of Crestor and repeat FLP and LFT in 2 months per protocol. May need to consider lipid clinic referral if her liver has difficulty handling statins.   Thanks!   Finis Bud, AGNP-C

## 2022-03-22 ENCOUNTER — Ambulatory Visit: Payer: Medicare Other | Attending: Cardiology | Admitting: Pharmacist

## 2022-03-22 DIAGNOSIS — I4891 Unspecified atrial fibrillation: Secondary | ICD-10-CM | POA: Diagnosis not present

## 2022-03-22 DIAGNOSIS — Z5181 Encounter for therapeutic drug level monitoring: Secondary | ICD-10-CM | POA: Diagnosis not present

## 2022-03-22 LAB — POCT INR: POC INR: 2.2

## 2022-03-22 NOTE — Patient Instructions (Signed)
Continue warfarin 1/2 tablet daily except 1 tablet on Mondays, Wednesdays and Fridays Recheck in 6 weeks

## 2022-04-05 ENCOUNTER — Ambulatory Visit: Payer: Medicare Other | Attending: Cardiology | Admitting: *Deleted

## 2022-04-05 DIAGNOSIS — Z5181 Encounter for therapeutic drug level monitoring: Secondary | ICD-10-CM | POA: Diagnosis not present

## 2022-04-05 DIAGNOSIS — I4891 Unspecified atrial fibrillation: Secondary | ICD-10-CM | POA: Diagnosis not present

## 2022-04-05 LAB — POCT INR: INR: 2.7 (ref 2.0–3.0)

## 2022-04-05 NOTE — Patient Instructions (Signed)
Continue warfarin 1/2 tablet daily except 1 tablet on Mondays, Wednesdays and Fridays.  Recheck in 6 weeks.  

## 2022-04-14 ENCOUNTER — Telehealth: Payer: Self-pay | Admitting: Cardiology
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
## 2022-04-26 ENCOUNTER — Ambulatory Visit (INDEPENDENT_AMBULATORY_CARE_PROVIDER_SITE_OTHER): Payer: Medicare Other | Admitting: Family Medicine

## 2022-04-26 ENCOUNTER — Encounter: Payer: Self-pay | Admitting: Family Medicine

## 2022-04-26 VITALS — BP 130/79 | HR 83 | Ht 62.0 in | Wt 108.0 lb

## 2022-04-26 DIAGNOSIS — Z13220 Encounter for screening for lipoid disorders: Secondary | ICD-10-CM

## 2022-04-26 DIAGNOSIS — Z0001 Encounter for general adult medical examination with abnormal findings: Secondary | ICD-10-CM

## 2022-04-26 DIAGNOSIS — Z1329 Encounter for screening for other suspected endocrine disorder: Secondary | ICD-10-CM

## 2022-04-26 DIAGNOSIS — Z131 Encounter for screening for diabetes mellitus: Secondary | ICD-10-CM

## 2022-04-26 DIAGNOSIS — I10 Essential (primary) hypertension: Secondary | ICD-10-CM | POA: Diagnosis not present

## 2022-04-26 DIAGNOSIS — R053 Chronic cough: Secondary | ICD-10-CM | POA: Diagnosis not present

## 2022-04-26 MED ORDER — LEVOCETIRIZINE DIHYDROCHLORIDE 5 MG PO TABS
5.0000 mg | ORAL_TABLET | Freq: Every evening | ORAL | 1 refills | Status: DC
Start: 2022-04-26 — End: 2022-05-03
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
## 2022-04-26 NOTE — Patient Instructions (Signed)
It was pleasure meeting with you today. Please take medications as prescribed. Follow up with your primary health provider if any health concerns arises. If symptoms worsen please contact your primary care provider and/or visit the emergency department.  
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
## 2022-04-26 NOTE — Assessment & Plan Note (Signed)
Vitals:   04/26/22 0930 04/26/22 0945  BP: (!) 132/57 130/79  Controlled, continue Losartan 50 mg, Metoprolol 50 mg daily. Advise lifestyle modifications follow diet low in saturated fat, reduce dietary salt intake, avoid fatty foods.
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
## 2022-04-28 ENCOUNTER — Other Ambulatory Visit: Payer: Self-pay | Admitting: Family Medicine

## 2022-04-28 DIAGNOSIS — N1832 Chronic kidney disease, stage 3b: Secondary | ICD-10-CM

## 2022-04-28 DIAGNOSIS — Z1329 Encounter for screening for other suspected endocrine disorder: Secondary | ICD-10-CM

## 2022-04-28 LAB — CBC WITH DIFFERENTIAL/PLATELET
Basophils Absolute: 0 10*3/uL (ref 0.0–0.2)
Basos: 0 %
EOS (ABSOLUTE): 0.1 10*3/uL (ref 0.0–0.4)
Eos: 1 %
Hematocrit: 36.9 % (ref 34.0–46.6)
Hemoglobin: 11.9 g/dL (ref 11.1–15.9)
Immature Grans (Abs): 0.1 10*3/uL (ref 0.0–0.1)
Immature Granulocytes: 1 %
Lymphocytes Absolute: 1.4 10*3/uL (ref 0.7–3.1)
Lymphs: 18 %
MCH: 29.7 pg (ref 26.6–33.0)
MCHC: 32.2 g/dL (ref 31.5–35.7)
MCV: 92 fL (ref 79–97)
Monocytes Absolute: 0.7 10*3/uL (ref 0.1–0.9)
Monocytes: 9 %
Neutrophils Absolute: 5.4 10*3/uL (ref 1.4–7.0)
Neutrophils: 71 %
Platelets: 390 10*3/uL (ref 150–450)
RBC: 4.01 x10E6/uL (ref 3.77–5.28)
RDW: 12.3 % (ref 11.7–15.4)
WBC: 7.7 10*3/uL (ref 3.4–10.8)

## 2022-04-28 LAB — CMP14+EGFR
ALT: 12 IU/L (ref 0–32)
AST: 22 IU/L (ref 0–40)
Albumin/Globulin Ratio: 1.3 (ref 1.2–2.2)
Albumin: 4 g/dL (ref 3.7–4.7)
Alkaline Phosphatase: 130 IU/L — ABNORMAL HIGH (ref 44–121)
BUN/Creatinine Ratio: 12 (ref 12–28)
BUN: 16 mg/dL (ref 8–27)
Bilirubin Total: 0.5 mg/dL (ref 0.0–1.2)
CO2: 22 mmol/L (ref 20–29)
Calcium: 9.8 mg/dL (ref 8.7–10.3)
Chloride: 101 mmol/L (ref 96–106)
Creatinine, Ser: 1.29 mg/dL — ABNORMAL HIGH (ref 0.57–1.00)
Globulin, Total: 3.2 g/dL (ref 1.5–4.5)
Glucose: 102 mg/dL — ABNORMAL HIGH (ref 70–99)
Potassium: 4.5 mmol/L (ref 3.5–5.2)
Sodium: 143 mmol/L (ref 134–144)
Total Protein: 7.2 g/dL (ref 6.0–8.5)
eGFR: 41 mL/min/{1.73_m2} — ABNORMAL LOW (ref >59)

## 2022-04-28 LAB — MICROALBUMIN / CREATININE URINE RATIO
Creatinine, Urine: 253.1 mg/dL
Microalb/Creat Ratio: 9 mg/g creat (ref 0–29)
Microalbumin, Urine: 24 ug/mL

## 2022-04-28 LAB — TSH+FREE T4
Free T4: 2.02 ng/dL — ABNORMAL HIGH (ref 0.82–1.77)
TSH: 0.228 u[IU]/mL — ABNORMAL LOW (ref 0.450–4.500)

## 2022-04-28 LAB — LIPID PANEL
Chol/HDL Ratio: 3.1 ratio (ref 0.0–4.4)
Cholesterol, Total: 147 mg/dL (ref 100–199)
HDL: 48 mg/dL (ref >39)
LDL Chol Calc (NIH): 84 mg/dL (ref 0–99)
Triglycerides: 74 mg/dL (ref 0–149)
VLDL Cholesterol Cal: 15 mg/dL (ref 5–40)

## 2022-04-28 LAB — HEMOGLOBIN A1C
Est. average glucose Bld gHb Est-mCnc: 114 mg/dL
Hgb A1c MFr Bld: 5.6 % (ref 4.8–5.6)

## 2022-04-28 NOTE — Progress Notes (Signed)
Please inform patient we will need to recheck thyroid panel in 4 weeks fasting since the results were elevated. Orders in.  eGFR levels low indicates chronic kidney disease. I advise to keep your hypertension controlled, maintain blood pressure reading goals under 130/80, take your daily blood pressure medications.Avoid NSAIDs medications and take tylenol for pain management. I encourage  eat a kidney friendly diet which consist of vegetables,fruits,whole grains, fat free or low fat diary,fish,poultry,beans,nuts and seeds. Avoid fatty foods, limit or avoid smoking and alcohol intake. Maintain an excercise routine to minimum of 150 minuties a week.  Referral to nephrology placed   Alkaline phosphate slightly elevated may indicate liver function or bone development issues, We will recheck your labs with additional labs to identity source of elevation in our next visit. I advise to consume vegetables like broccoli, onions, dandelion greens, cabbage, cauliflower and brussels sprouts which has a cleansing effect on the liver. You must also reduce your alcohol intake and limit smoking. Vitamin D deficiency has been linked with high alkaline phosphatase. To increase vitamin D get more sun and/or take vitamin D supplements  [phone] IU/day to prevent low vitamin D levels.

## 2022-05-03 ENCOUNTER — Other Ambulatory Visit: Payer: Self-pay | Admitting: Family Medicine

## 2022-05-03 DIAGNOSIS — R053 Chronic cough: Secondary | ICD-10-CM

## 2022-05-05 ENCOUNTER — Encounter: Payer: Self-pay | Admitting: *Deleted
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
## 2022-05-06 ENCOUNTER — Ambulatory Visit: Payer: Medicare Other | Attending: Cardiology | Admitting: Cardiology

## 2022-05-06 ENCOUNTER — Encounter: Payer: Self-pay | Admitting: Cardiology

## 2022-05-06 VITALS — BP 102/54 | HR 79 | Ht 62.0 in | Wt 109.0 lb

## 2022-05-06 DIAGNOSIS — I350 Nonrheumatic aortic (valve) stenosis: Secondary | ICD-10-CM

## 2022-05-06 DIAGNOSIS — Z86711 Personal history of pulmonary embolism: Secondary | ICD-10-CM | POA: Diagnosis not present

## 2022-05-06 DIAGNOSIS — E782 Mixed hyperlipidemia: Secondary | ICD-10-CM

## 2022-05-06 DIAGNOSIS — I25119 Atherosclerotic heart disease of native coronary artery with unspecified angina pectoris: Secondary | ICD-10-CM

## 2022-05-06 NOTE — Patient Instructions (Addendum)
Medication Instructions:  Your physician recommends that you continue on your current medications as directed. Please refer to the Current Medication list given to you today.  Labwork: none  Testing/Procedures: Your physician has requested that you have an echocardiogram in 6 months just before your next visit. Echocardiography is a painless test that uses sound waves to create images of your heart. It provides your doctor with information about the size and shape of your heart and how well your heart's chambers and valves are working. This procedure takes approximately one hour. There are no restrictions for this procedure. Please do NOT wear cologne, perfume, aftershave, or lotions (deodorant is allowed). Please arrive 15 minutes prior to your appointment time.  Follow-Up: Your physician recommends that you schedule a follow-up appointment in: 6 months  Any Other Special Instructions Will Be Listed Below (If Applicable).  If you need a refill on your cardiac medications before your next appointment, please call your pharmacy. 

## 2022-05-17 ENCOUNTER — Ambulatory Visit: Payer: Medicare Other | Attending: Cardiology | Admitting: *Deleted

## 2022-05-17 DIAGNOSIS — I4891 Unspecified atrial fibrillation: Secondary | ICD-10-CM

## 2022-05-17 DIAGNOSIS — Z5181 Encounter for therapeutic drug level monitoring: Secondary | ICD-10-CM | POA: Diagnosis not present

## 2022-05-17 LAB — POCT INR: INR: 3.8 — AB (ref 2.0–3.0)

## 2022-05-17 NOTE — Patient Instructions (Signed)
HOLD warfarin tonight then continue 1/2 tablet daily except 1 tablet on Mondays, Wednesdays and Fridays Recheck in 3 weeks

## 2022-05-31 DIAGNOSIS — I10 Essential (primary) hypertension: Secondary | ICD-10-CM | POA: Diagnosis not present

## 2022-05-31 DIAGNOSIS — N1832 Chronic kidney disease, stage 3b: Secondary | ICD-10-CM | POA: Diagnosis not present

## 2022-06-01 ENCOUNTER — Other Ambulatory Visit (HOSPITAL_COMMUNITY): Payer: Self-pay | Admitting: Nephrology

## 2022-06-01 DIAGNOSIS — N1832 Chronic kidney disease, stage 3b: Secondary | ICD-10-CM

## 2022-06-04 DIAGNOSIS — N1832 Chronic kidney disease, stage 3b: Secondary | ICD-10-CM | POA: Diagnosis not present

## 2022-06-10 ENCOUNTER — Ambulatory Visit: Payer: Medicare Other | Attending: Cardiology | Admitting: *Deleted

## 2022-06-10 DIAGNOSIS — Z5181 Encounter for therapeutic drug level monitoring: Secondary | ICD-10-CM | POA: Diagnosis not present

## 2022-06-10 DIAGNOSIS — I4891 Unspecified atrial fibrillation: Secondary | ICD-10-CM

## 2022-06-10 LAB — POCT INR: POC INR: 2.8

## 2022-06-10 NOTE — Patient Instructions (Signed)
Description   Continue 1/2 tablet daily except 1 tablet on Mondays, Wednesdays and Fridays Recheck in 4 weeks       

## 2022-06-14 ENCOUNTER — Ambulatory Visit (HOSPITAL_COMMUNITY)
Admission: RE | Admit: 2022-06-14 | Discharge: 2022-06-14 | Disposition: A | Discharge status: Home / Self Care | Payer: Medicare Other | Source: Ambulatory Visit | Attending: Nephrology | Admitting: Nephrology

## 2022-06-14 DIAGNOSIS — N189 Chronic kidney disease, unspecified: Secondary | ICD-10-CM | POA: Diagnosis not present

## 2022-06-14 DIAGNOSIS — N1832 Chronic kidney disease, stage 3b: Secondary | ICD-10-CM | POA: Diagnosis not present

## 2022-06-29 ENCOUNTER — Ambulatory Visit: Payer: Medicare Other | Attending: Cardiology | Admitting: *Deleted

## 2022-06-29 ENCOUNTER — Other Ambulatory Visit: Payer: Medicare Other

## 2022-06-29 DIAGNOSIS — Z5181 Encounter for therapeutic drug level monitoring: Secondary | ICD-10-CM | POA: Diagnosis not present

## 2022-06-29 DIAGNOSIS — I4891 Unspecified atrial fibrillation: Secondary | ICD-10-CM

## 2022-06-29 LAB — POCT INR: INR: 1.9 — AB (ref 2.0–3.0)

## 2022-06-29 NOTE — Patient Instructions (Signed)
Take warfarin 1 tablet tonight then resume 1/2 tablet daily except 1 tablet on Mondays, Wednesdays and Fridays Recheck in 4 weeks

## 2022-07-13 ENCOUNTER — Ambulatory Visit (INDEPENDENT_AMBULATORY_CARE_PROVIDER_SITE_OTHER): Payer: Medicare Other

## 2022-07-13 VITALS — Ht 62.0 in | Wt 112.0 lb

## 2022-07-13 DIAGNOSIS — Z0000 Encounter for general adult medical examination without abnormal findings: Secondary | ICD-10-CM

## 2022-07-13 NOTE — Progress Notes (Signed)
Subjective:   Danielle Murphy is a 82 y.o. female who presents for an Initial Medicare Annual Wellness Visit.  I connected with  Theodis Sato on 07/13/22 by a audio enabled telemedicine application and verified that I am speaking with the correct person using two identifiers.  Patient Location: Home  Provider Location: Home Office  I discussed the limitations of evaluation and management by telemedicine. The patient expressed understanding and agreed to proceed.  Patient Medicare AWV questionnaire was completed by the patient on n/a; I have confirmed that all information answered by patient is correct and no changes since this date.  Review of Systems     Cardiac Risk Factors include: advanced age (>22men, >46 women);hypertension;Other (see comment), Risk factor comments: afib, CAD, hx of pulmonary embolism, Aortic stenosis     Objective:    Today's Vitals   07/13/22 1020  Weight: 112 lb (50.8 kg)  Height: 5\' 2"  (1.575 m)   Body mass index is 20.49 kg/m.     07/13/2022   10:25 AM 10/01/2020    1:21 AM 09/30/2020    9:01 PM 08/22/2017    9:26 AM 09/09/2015    9:19 PM 09/05/2015    9:07 PM 07/17/2014    6:07 PM  Advanced Directives  Does Patient Have a Medical Advance Directive? No No No No No No No  Would patient like information on creating a medical advance directive? No - Patient declined No - Patient declined  No - Patient declined   No - patient declined information    Current Medications (verified) Outpatient Encounter Medications as of 07/13/2022  Medication Sig   metoprolol succinate (TOPROL-XL) 50 MG 24 hr tablet TAKE 1 & 1/2 TABLETS BY MOUTH (75 MG) DAILY.   rosuvastatin (CRESTOR) 5 MG tablet Take 1 tablet (5 mg total) by mouth daily. (Patient taking differently: Take 10 mg by mouth daily.)   acetaminophen (TYLENOL) 500 MG tablet Take 500 mg by mouth every 6 (six) hours as needed.   aspirin 81 MG tablet Take 81 mg by mouth every morning.   furosemide (LASIX) 40 MG  tablet Take 1 tablet (40 mg total) by mouth daily. (Patient not taking: Reported on 05/06/2022)   levocetirizine (XYZAL) 5 MG tablet TAKE 1 TABLET BY MOUTH EVERY DAY IN THE EVENING   losartan (COZAAR) 50 MG tablet TAKE 1 AND A HALF TABLETS BY MOUTH EVERY DAY   mupirocin ointment (BACTROBAN) 2 % Apply 1 Application topically 2 (two) times daily.   UNABLE TO FIND Med Name: [facility]   warfarin (COUMADIN) 4 MG tablet Take 1/2 to 1 tablet daily as directed by coumadin clinic. (Patient not taking: Reported on 07/13/2022)   No facility-administered encounter medications on file as of 07/13/2022.    Allergies (verified) Aspirin, Niacin, Ramipril, Simvastatin, and Sulfa antibiotics   History: Past Medical History:  Diagnosis Date   Aortic stenosis    Coronary atherosclerosis of native coronary artery    BMS LAD 2002   DVT (deep venous thrombosis) (HCC)    Essential hypertension    Hyperlipidemia    Pericardial cyst    Status post excision 2002   Pulmonary embolism [facility])    Past Surgical History:  Procedure Laterality Date   Pericardial cyst excision  2002   Family History  Problem Relation Age of Onset   Stroke Mother    Cancer Father    Social History   Socioeconomic History   Marital status: Married    Spouse name:  Not on file   Number of children: Not on file   Years of education: Not on file   Highest education level: Not on file  Occupational History   Occupation: Retired  Tobacco Use   Smoking status: Never    Passive exposure: Never   Smokeless tobacco: Never  Vaping Use   Vaping Use: Never used  Substance and Sexual Activity   Alcohol use: No    Alcohol/week: 0.0 standard drinks of alcohol   Drug use: No   Sexual activity: Not Currently  Other Topics Concern   Not on file  Social History Narrative   Not on file   Social Determinants of Health   Financial Resource Strain: Low Risk  (07/13/2022)   Overall Financial Resource Strain (CARDIA)    Difficulty of  Paying Living Expenses: Not hard at all  Food Insecurity: No Food Insecurity (07/13/2022)   Hunger Vital Sign    Worried About Running Out of Food in the Last Year: Never true    Ran Out of Food in the Last Year: Never true  Transportation Needs: No Transportation Needs (07/13/2022)   PRAPARE - Administrator, Civil Service (Medical): No    Lack of Transportation (Non-Medical): No  Physical Activity: Sufficiently Active (07/13/2022)   Exercise Vital Sign    Days of Exercise per Week: 7 days    Minutes of Exercise per Session: 30 min  Stress: No Stress Concern Present (07/13/2022)   [employer] of Occupational Health - Occupational Stress Questionnaire    Feeling of Stress : Not at all  Social Connections: Socially Integrated (07/13/2022)   Social Connection and Isolation Panel [NHANES]    Frequency of Communication with Friends and Family: More than three times a week    Frequency of Social Gatherings with Friends and Family: More than three times a week    Attends Religious Services: More than 4 times per year    Active Member of [employer] or Organizations: Yes    Attends Engineer, structural: More than 4 times per year    Marital Status: Married    Tobacco Counseling Counseling given: Yes   Clinical Intake:  Pre-visit preparation completed: Yes  Pain : No/denies pain     BMI - recorded: 20.49 Nutritional Status: BMI of 19-24  Normal Nutritional Risks: None Diabetes: No  How often do you need to have someone help you when you read instructions, pamphlets, or other written materials from your doctor or pharmacy?: 1 - Never  Interpreter Needed?: No  Information entered by :: Abby , CMA   Activities of Daily Living    07/13/2022   10:26 AM  In your present state of health, do you have any difficulty performing the following activities:  Hearing? 0  Vision? 0  Difficulty concentrating or making decisions? 0  Walking or climbing stairs? 0   Dressing or bathing? 0  Doing errands, shopping? 0  Preparing Food and eating ? N  Using the Toilet? N  In the past six months, have you accidently leaked urine? N  Do you have problems with loss of bowel control? N  Managing your Medications? N  Managing your Finances? N  Housekeeping or managing your Housekeeping? N    Patient Care Team: Del Newman Nip, Tenna Child, FNP as PCP - General (Family Medicine) Jonelle Sidle, MD as PCP - Cardiology (Cardiology) Jena Gauss Gerrit Friends, MD as Consulting Physician (Gastroenterology)  Indicate any recent Medical Services you may have received from  other than [facility] providers in the past year (date may be approximate).     Assessment:   This is a routine wellness examination for Annlee.  Hearing/Vision screen Hearing Screening - Comments:: Patient denies any hearing difficulties.   Vision Screening - Comments:: Denies any vision difficulties   Dietary issues and exercise activities discussed:     Goals Addressed             This Visit's Progress    Patient Stated       Patient states her goal is to eat healthier        Depression Screen    07/13/2022   10:23 AM 04/26/2022    9:35 AM  PHQ 2/9 Scores  PHQ - 2 Score 0 0  PHQ- 9 Score  0    Fall Risk    07/13/2022   10:25 AM 04/26/2022    9:35 AM  Fall Risk   Falls in the past year? 0 0  Number falls in past yr: 0 0  Injury with Fall? 0 0  Risk for fall due to : No Fall Risks No Fall Risks  Follow up Falls prevention discussed Falls evaluation completed    MEDICARE RISK AT HOME:  Medicare Risk at Home - 07/13/22 1027     Any stairs in or around the home? Yes    If so, are there any without handrails? No    Home free of loose throw rugs in walkways, pet beds, electrical cords, etc? Yes    Adequate lighting in your home to reduce risk of falls? Yes    Life alert? No    Use of a cane, walker or w/c? No    Grab bars in the bathroom? No    Shower chair or bench in shower?  No    Elevated toilet seat or a handicapped toilet? No             TIMED UP AND GO:  Was the test performed? No    Cognitive Function:        07/13/2022   10:27 AM  6CIT Screen  What Year? 0 points  What month? 0 points  What time? 0 points  Count back from 20 0 points  Months in reverse 0 points  Repeat phrase 0 points  Total Score 0 points    Immunizations Immunization History  Administered Date(s) Administered   Influenza, High Dose Seasonal PF 11/02/2017, 10/06/2018   Tdap 07/17/2014    TDAP status: Up to date  Flu Vaccine status: Up to date  Pneumococcal vaccine status: Due, Education has been provided regarding the importance of this vaccine. Advised may receive this vaccine at local pharmacy or Health Dept. Aware to provide a copy of the vaccination record if obtained from local pharmacy or Health Dept. Verbalized acceptance and understanding.  Covid-19 vaccine status: Information provided on how to obtain vaccines.   Qualifies for Shingles Vaccine? Yes   Zostavax completed No   Shingrix Completed?: No.    Education has been provided regarding the importance of this vaccine. Patient has been advised to call insurance company to determine out of pocket expense if they have not yet received this vaccine. Advised may also receive vaccine at local pharmacy or Health Dept. Verbalized acceptance and understanding.  Screening Tests Health Maintenance  Topic Date Due   COVID-19 Vaccine (1) Never done   Pneumonia Vaccine 31+ Years old (1 of 2 - PCV) Never done   Zoster Vaccines- Shingrix (1  of 2) Never done   INFLUENZA VACCINE  08/26/2022   Medicare Annual Wellness (AWV)  07/13/2023   DTaP/Tdap/Td (2 - Td or Tdap) 07/16/2024   DEXA SCAN  Completed   HPV VACCINES  Aged Out    Health Maintenance  Health Maintenance Due  Topic Date Due   COVID-19 Vaccine (1) Never done   Pneumonia Vaccine 21+ Years old (1 of 2 - PCV) Never done   Zoster Vaccines- Shingrix  (1 of 2) Never done    Colorectal cancer screening: No longer required.   Mammogram status: No longer required due to age.  Bone Density status: Completed 09/22/2015. Results reflect: Bone density results: OSTEOPENIA. Repeat every 2 years.  Lung Cancer Screening: (Low Dose CT Chest recommended if Age 45-80 years, 20 pack-year currently smoking OR have quit w/in 15years.) does not qualify.   Additional Screening:  Hepatitis C Screening: does not qualify;  Vision Screening: Recommended annual ophthalmology exams for early detection of glaucoma and other disorders of the eye. Is the patient up to date with their annual eye exam?  No  Who is the provider or what is the name of the office in which the patient attends annual eye exams? Doesn't recall If pt is not established with a provider, would they like to be referred to a provider to establish care? No .   Dental Screening: Recommended annual dental exams for proper oral hygiene  Diabetic Foot Exam: n/a  Community Resource Referral / Chronic Care Management: CRR required this visit?  No   CCM required this visit?  No     Plan:     I have personally reviewed and noted the following in the patient's chart:   Medical and social history Use of alcohol, tobacco or illicit drugs  Current medications and supplements including opioid prescriptions. Patient is not currently taking opioid prescriptions. Functional ability and status Nutritional status Physical activity Advanced directives List of other physicians Hospitalizations, surgeries, and ER visits in previous 12 months Vitals Screenings to include cognitive, depression, and falls Referrals and appointments  In addition, I have reviewed and discussed with patient certain preventive protocols, quality metrics, and best practice recommendations. A written personalized care plan for preventive services as well as general preventive health recommendations were provided to  patient.   Any medications not marked as taking were not mentioned by the patient (or their caregiver if applicable) when reconciling the medications.    Jordan Hawks , CMA   07/13/2022   After Visit Summary: (Mail) Due to this being a telephonic visit, the after visit summary with patients personalized plan was offered to patient via mail   Nurse Notes:

## 2022-07-13 NOTE — Patient Instructions (Signed)
Danielle Murphy , Thank you for taking time to come for your Medicare Wellness Visit. I appreciate your ongoing commitment to your health goals. Please review the following plan we discussed and let me know if I can assist you in the future.   These are the goals we discussed:  Goals      Patient Stated     Patient states her goal is to eat healthier         This is a list of the screening recommended for you and due dates:  Health Maintenance  Topic Date Due   COVID-19 Vaccine (1) Never done   Pneumonia Vaccine (1 of 2 - PCV) Never done   Zoster (Shingles) Vaccine (1 of 2) Never done   Flu Shot  08/26/2022   Medicare Annual Wellness Visit  07/13/2023   DTaP/Tdap/Td vaccine (2 - Td or Tdap) 07/16/2024   DEXA scan (bone density measurement)  Completed   HPV Vaccine  Aged Out    Advanced directives: Advance directive discussed with you today. Even though you declined this today, please call our office should you change your mind, and we can give you the proper paperwork for you to fill out. Advance care planning is a way to make decisions about medical care that fits your values in case you are ever unable to make these decisions for yourself.  Information on Advanced Care Planning can be found at [facility] [employer] Advance Health Care Directives Advance Health Care Directives ([email or web address]    Conditions/risks identified: Aim for 30 minutes of exercise or brisk walking, 6-8 glasses of water, and 5 servings of fruits and vegetables each day.   Next appointment:  Follow up in one year for your annual wellness visit August 03, 2023 at 2:30pm telephone visit.    Preventive Care 82 Years and Older, Female Preventive care refers to lifestyle choices and visits with your health care provider that can promote health and wellness. What does preventive care include? A yearly physical exam. This is also called an annual well check. Dental exams once or twice a year. Routine eye  exams. Ask your health care provider how often you should have your eyes checked. Personal lifestyle choices, including: Daily care of your teeth and gums. Regular physical activity. Eating a healthy diet. Avoiding tobacco and drug use. Limiting alcohol use. Practicing safe sex. Taking low-dose aspirin every day. Taking vitamin and mineral supplements as recommended by your health care provider. What happens during an annual well check? The services and screenings done by your health care provider during your annual well check will depend on your age, overall health, lifestyle risk factors, and family history of disease. Counseling  Your health care provider may ask you questions about your: Alcohol use. Tobacco use. Drug use. Emotional well-being. Home and relationship well-being. Sexual activity. Eating habits. History of falls. Memory and ability to understand (cognition). Work and work Astronomer. Reproductive health. Screening  You may have the following tests or measurements: Height, weight, and BMI. Blood pressure. Lipid and cholesterol levels. These may be checked every 5 years, or more frequently if you are over 70 years old. Skin check. Lung cancer screening. You may have this screening every year starting at age 72 if you have a 30-pack-year history of smoking and currently smoke or have quit within the past 15 years. Fecal occult blood test (FOBT) of the stool. You may have this test every year starting at age 58. Flexible sigmoidoscopy or colonoscopy. You  may have a sigmoidoscopy every 5 years or a colonoscopy every 10 years starting at age 37. Hepatitis C blood test. Hepatitis B blood test. Sexually transmitted disease (STD) testing. Diabetes screening. This is done by checking your blood sugar (glucose) after you have not eaten for a while (fasting). You may have this done every 1-3 years. Bone density scan. This is done to screen for osteoporosis. You may have  this done starting at age 75. Mammogram. This may be done every 1-2 years. Talk to your health care provider about how often you should have regular mammograms. Talk with your health care provider about your test results, treatment options, and if necessary, the need for more tests. Vaccines  Your health care provider may recommend certain vaccines, such as: Influenza vaccine. This is recommended every year. Tetanus, diphtheria, and acellular pertussis (Tdap, Td) vaccine. You may need a Td booster every 10 years. Zoster vaccine. You may need this after age 82. Pneumococcal 13-valent conjugate (PCV13) vaccine. One dose is recommended after age 82. Pneumococcal polysaccharide (PPSV23) vaccine. One dose is recommended after age 82. Talk to your health care provider about which screenings and vaccines you need and how often you need them. This information is not intended to replace advice given to you by your health care provider. Make sure you discuss any questions you have with your health care provider. Document Released: 02/07/2015 Document Revised: 10/01/2015 Document Reviewed: 11/12/2014 Elsevier Interactive Patient Education  2017 [employer].  Fall Prevention in the Home Falls can cause injuries. They can happen to people of all ages. There are many things you can do to make your home safe and to help prevent falls. What can I do on the outside of my home? Regularly fix the edges of walkways and driveways and fix any cracks. Remove anything that might make you trip as you walk through a door, such as a raised step or threshold. Trim any bushes or trees on the path to your home. Use bright outdoor lighting. Clear any walking paths of anything that might make someone trip, such as rocks or tools. Regularly check to see if handrails are loose or broken. Make sure that both sides of any steps have handrails. Any raised decks and porches should have guardrails on the edges. Have any leaves,  snow, or ice cleared regularly. Use sand or salt on walking paths during winter. Clean up any spills in your garage right away. This includes oil or grease spills. What can I do in the bathroom? Use night lights. Install grab bars by the toilet and in the tub and shower. Do not use towel bars as grab bars. Use non-skid mats or decals in the tub or shower. If you need to sit down in the shower, use a plastic, non-slip stool. Keep the floor dry. Clean up any water that spills on the floor as soon as it happens. Remove soap buildup in the tub or shower regularly. Attach bath mats securely with double-sided non-slip rug tape. Do not have throw rugs and other things on the floor that can make you trip. What can I do in the bedroom? Use night lights. Make sure that you have a light by your bed that is easy to reach. Do not use any sheets or blankets that are too big for your bed. They should not hang down onto the floor. Have a firm chair that has side arms. You can use this for support while you get dressed. Do not have throw  rugs and other things on the floor that can make you trip. What can I do in the kitchen? Clean up any spills right away. Avoid walking on wet floors. Keep items that you use a lot in easy-to-reach places. If you need to reach something above you, use a strong step stool that has a grab bar. Keep electrical cords out of the way. Do not use floor polish or wax that makes floors slippery. If you must use wax, use non-skid floor wax. Do not have throw rugs and other things on the floor that can make you trip. What can I do with my stairs? Do not leave any items on the stairs. Make sure that there are handrails on both sides of the stairs and use them. Fix handrails that are broken or loose. Make sure that handrails are as long as the stairways. Check any carpeting to make sure that it is firmly attached to the stairs. Fix any carpet that is loose or worn. Avoid having throw  rugs at the top or bottom of the stairs. If you do have throw rugs, attach them to the floor with carpet tape. Make sure that you have a light switch at the top of the stairs and the bottom of the stairs. If you do not have them, ask someone to add them for you. What else can I do to help prevent falls? Wear shoes that: Do not have high heels. Have rubber bottoms. Are comfortable and fit you well. Are closed at the toe. Do not wear sandals. If you use a stepladder: Make sure that it is fully opened. Do not climb a closed stepladder. Make sure that both sides of the stepladder are locked into place. Ask someone to hold it for you, if possible. Clearly mark and make sure that you can see: Any grab bars or handrails. First and last steps. Where the edge of each step is. Use tools that help you move around (mobility aids) if they are needed. These include: Canes. Walkers. Scooters. Crutches. Turn on the lights when you go into a dark area. Replace any light bulbs as soon as they burn out. Set up your furniture so you have a clear path. Avoid moving your furniture around. If any of your floors are uneven, fix them. If there are any pets around you, be aware of where they are. Review your medicines with your doctor. Some medicines can make you feel dizzy. This can increase your chance of falling. Ask your doctor what other things that you can do to help prevent falls. This information is not intended to replace advice given to you by your health care provider. Make sure you discuss any questions you have with your health care provider. Document Released: 11/07/2008 Document Revised: 06/19/2015 Document Reviewed: 02/15/2014 Elsevier Interactive Patient Education  2017 [employer].

## 2022-07-27 ENCOUNTER — Ambulatory Visit: Payer: Medicare Other | Attending: Cardiology | Admitting: *Deleted

## 2022-07-27 DIAGNOSIS — I4891 Unspecified atrial fibrillation: Secondary | ICD-10-CM

## 2022-07-27 DIAGNOSIS — Z5181 Encounter for therapeutic drug level monitoring: Secondary | ICD-10-CM

## 2022-07-27 LAB — POCT INR: INR: 2.4 (ref 2.0–3.0)

## 2022-07-27 NOTE — Patient Instructions (Signed)
Continue warfarin 1/2 tablet daily except 1 tablet on Mondays, Wednesdays and Fridays Recheck in 4 weeks 

## 2022-08-21 DIAGNOSIS — H2513 Age-related nuclear cataract, bilateral: Secondary | ICD-10-CM | POA: Diagnosis not present

## 2022-08-21 DIAGNOSIS — H353231 Exudative age-related macular degeneration, bilateral, with active choroidal neovascularization: Secondary | ICD-10-CM | POA: Diagnosis not present

## 2022-08-24 ENCOUNTER — Ambulatory Visit: Payer: Medicare Other

## 2022-08-26 ENCOUNTER — Ambulatory Visit: Payer: Medicare Other | Attending: Cardiology | Admitting: *Deleted

## 2022-08-26 DIAGNOSIS — I4891 Unspecified atrial fibrillation: Secondary | ICD-10-CM

## 2022-08-26 DIAGNOSIS — Z5181 Encounter for therapeutic drug level monitoring: Secondary | ICD-10-CM

## 2022-08-26 LAB — POCT INR: INR: 4.6 — AB (ref 2.0–3.0)

## 2022-08-26 NOTE — Patient Instructions (Signed)
Hold warfarin tonight and tomorrow night then resume 1/2 tablet daily except 1 tablet on Mondays, Wednesdays and Fridays Recheck in 2 weeks

## 2022-08-27 ENCOUNTER — Ambulatory Visit: Payer: Medicare Other | Admitting: Family Medicine

## 2022-08-30 ENCOUNTER — Ambulatory Visit: Payer: Medicare Other | Admitting: Family Medicine

## 2022-08-31 ENCOUNTER — Ambulatory Visit (HOSPITAL_COMMUNITY)
Admission: RE | Admit: 2022-08-31 | Discharge: 2022-08-31 | Disposition: A | Discharge status: Home / Self Care | Payer: Medicare Other | Source: Ambulatory Visit | Attending: Nurse Practitioner | Admitting: Nurse Practitioner

## 2022-08-31 DIAGNOSIS — I350 Nonrheumatic aortic (valve) stenosis: Secondary | ICD-10-CM | POA: Insufficient documentation

## 2022-08-31 LAB — ECHOCARDIOGRAM COMPLETE
AR max vel: 0.91 cm2
AV Area VTI: 1.08 cm2
AV Area mean vel: 1.09 cm2
AV Mean grad: 17 mmHg
AV Peak grad: 36.2 mmHg
Ao pk vel: 3.01 m/s
Area-P 1/2: 3.06 cm2
MV VTI: 1.92 cm2
S' Lateral: 2.7 cm

## 2022-08-31 NOTE — Progress Notes (Signed)
*  PRELIMINARY RESULTS* Echocardiogram 2D Echocardiogram has been performed.  Danielle Murphy 08/31/2022, 3:04 PM

## 2022-09-06 ENCOUNTER — Telehealth: Payer: Self-pay | Admitting: Cardiology

## 2022-09-06 NOTE — Telephone Encounter (Signed)
Danielle Sidle, MD 09/01/2022  8:45 AM EDT     Results reviewed.  LVEF is normal at 60 to 65%.  Aortic valve moderately calcified and most likely an moderately stenotic range with mean AV gradient 17 mmHg and dimensionless index 0.33.  Keep follow-up as scheduled.

## 2022-09-06 NOTE — Telephone Encounter (Signed)
  The patient's daughter is calling to request the results of the echo. She mentioned that the patient has dementia and does not remember what the nurse previously explained about the echo.

## 2022-09-06 NOTE — Telephone Encounter (Signed)
Patient's daughter Avalin Denzler informed and verbalized understanding of plan.

## 2022-09-09 ENCOUNTER — Ambulatory Visit: Payer: Medicare Other | Attending: Cardiology | Admitting: *Deleted

## 2022-09-09 ENCOUNTER — Ambulatory Visit (INDEPENDENT_AMBULATORY_CARE_PROVIDER_SITE_OTHER): Payer: Medicare Other | Admitting: Family Medicine

## 2022-09-09 ENCOUNTER — Encounter: Payer: Self-pay | Admitting: Family Medicine

## 2022-09-09 VITALS — BP 120/67 | HR 79 | Ht 60.0 in | Wt 106.0 lb

## 2022-09-09 DIAGNOSIS — R4189 Other symptoms and signs involving cognitive functions and awareness: Secondary | ICD-10-CM | POA: Diagnosis not present

## 2022-09-09 DIAGNOSIS — I10 Essential (primary) hypertension: Secondary | ICD-10-CM

## 2022-09-09 DIAGNOSIS — E559 Vitamin D deficiency, unspecified: Secondary | ICD-10-CM

## 2022-09-09 DIAGNOSIS — Z1329 Encounter for screening for other suspected endocrine disorder: Secondary | ICD-10-CM

## 2022-09-09 DIAGNOSIS — E538 Deficiency of other specified B group vitamins: Secondary | ICD-10-CM

## 2022-09-09 DIAGNOSIS — Z5181 Encounter for therapeutic drug level monitoring: Secondary | ICD-10-CM | POA: Diagnosis not present

## 2022-09-09 DIAGNOSIS — I4891 Unspecified atrial fibrillation: Secondary | ICD-10-CM

## 2022-09-09 LAB — POCT INR: INR: 1.8 — AB (ref 2.0–3.0)

## 2022-09-09 NOTE — Assessment & Plan Note (Signed)
Patient's daughter reports recent memory changes  MMSE score 24 Vit B 12 and Folic labs ordered Will continue to monitor.

## 2022-09-09 NOTE — Patient Instructions (Signed)

## 2022-09-09 NOTE — Progress Notes (Signed)
Patient Office Visit   Subjective   Patient ID: Danielle Murphy, female    DOB: 1940/06/01  Age: 82 y.o. MRN: [id number]  CC:  Chief Complaint  Patient presents with   Hypertension    Patient is here for f/u of HTN. No changes or concerns since last visit.     HPI Danielle Murphy 82 year old female, presents to the clinic for chronic follow up.She  has a past medical history of Aortic stenosis, Coronary atherosclerosis of native coronary artery, DVT (deep venous thrombosis) (HCC), Essential hypertension, Hyperlipidemia, Pericardial cyst, and Pulmonary embolism (HCC).For the details of today's visit, please refer to assessment and plan.   HPI    Outpatient Encounter Medications as of 09/09/2022  Medication Sig   acetaminophen (TYLENOL) 500 MG tablet Take 500 mg by mouth every 6 (six) hours as needed.   aspirin 81 MG tablet Take 81 mg by mouth every morning.   furosemide (LASIX) 40 MG tablet Take 1 tablet (40 mg total) by mouth daily.   levocetirizine (XYZAL) 5 MG tablet TAKE 1 TABLET BY MOUTH EVERY DAY IN THE EVENING   losartan (COZAAR) 50 MG tablet TAKE 1 AND A HALF TABLETS BY MOUTH EVERY DAY   metoprolol succinate (TOPROL-XL) 50 MG 24 hr tablet TAKE 1 & 1/2 TABLETS BY MOUTH (75 MG) DAILY.   mupirocin ointment (BACTROBAN) 2 % Apply 1 Application topically 2 (two) times daily.   rosuvastatin (CRESTOR) 5 MG tablet Take 1 tablet (5 mg total) by mouth daily. (Patient taking differently: Take 10 mg by mouth daily.)   UNABLE TO FIND Med Name: [facility]   VITAMIN D, CHOLECALCIFEROL, PO Take by mouth.   warfarin (COUMADIN) 4 MG tablet Take 1/2 to 1 tablet daily as directed by coumadin clinic.   No facility-administered encounter medications on file as of 09/09/2022.    Past Surgical History:  Procedure Laterality Date   Pericardial cyst excision  2002    Review of Systems  Constitutional:  Negative for chills and fever.  Eyes:  Negative for blurred vision.  Respiratory:  Negative for  shortness of breath.   Cardiovascular:  Negative for chest pain.  Gastrointestinal:  Negative for abdominal pain.      Objective    BP 120/67   Pulse 79   Ht 5' (1.524 m)   Wt 106 lb (48.1 kg)   SpO2 95%   BMI 20.70 kg/m   Physical Exam Vitals reviewed.  Constitutional:      General: She is not in acute distress.    Appearance: Normal appearance. She is not ill-appearing, toxic-appearing or diaphoretic.  HENT:     Head: Normocephalic.  Eyes:     General:        Right eye: No discharge.        Left eye: No discharge.     Conjunctiva/sclera: Conjunctivae normal.  Cardiovascular:     Rate and Rhythm: Normal rate.     Pulses: Normal pulses.     Heart sounds: Normal heart sounds.  Pulmonary:     Effort: Pulmonary effort is normal. No respiratory distress.     Breath sounds: Normal breath sounds.  Musculoskeletal:        General: Normal range of motion.     Cervical back: Normal range of motion.  Skin:    General: Skin is warm and dry.     Capillary Refill: Capillary refill takes less than 2 seconds.  Neurological:     General: No  focal deficit present.     Mental Status: She is alert and oriented to person, place, and time.     Coordination: Coordination normal.     Gait: Gait normal.  Psychiatric:        Behavior: Behavior normal.       Assessment & Plan:  Primary hypertension -     CMP14+EGFR  Screening for thyroid disorder -     TSH + free T4  B12 deficiency -     B12 and Folate Panel  Vitamin D deficiency -     VITAMIN D 25 Hydroxy (Vit-D Deficiency, Fractures)  Essential hypertension Assessment & Plan: Controlled, Labs ordered in today's visit. Continue Losartan 50 mg and Metoprolol 50 mg daily. Discussed DASH diet, low sodium diet and maintain a exercise routine for 150 minutes per week.    Cognitive change Assessment & Plan: Patient's daughter reports recent memory changes  MMSE score 24 Vit B 12 and Folic labs ordered Will continue to  monitor.     Return in about 4 months (around 01/09/2023), or if symptoms worsen or fail to improve, for chronic follow-up.   Cruzita Lederer Newman Nip, FNP

## 2022-09-09 NOTE — Patient Instructions (Signed)
Take warfarin 1 tablet tonight then resume 1/2 tablet daily except 1 tablet on Mondays, Wednesdays and Fridays Recheck in 3 weeks

## 2022-09-09 NOTE — Assessment & Plan Note (Addendum)
Controlled, Labs ordered in today's visit. Continue Losartan 50 mg and Metoprolol 50 mg daily. Discussed DASH diet, low sodium diet and maintain a exercise routine for 150 minutes per week.

## 2022-09-10 LAB — CMP14+EGFR
ALT: 14 IU/L (ref 0–32)
AST: 30 IU/L (ref 0–40)
Albumin: 3.9 g/dL (ref 3.7–4.7)
Alkaline Phosphatase: 82 IU/L (ref 44–121)
BUN/Creatinine Ratio: 9 — ABNORMAL LOW (ref 12–28)
BUN: 13 mg/dL (ref 8–27)
Bilirubin Total: 0.5 mg/dL (ref 0.0–1.2)
CO2: 27 mmol/L (ref 20–29)
Calcium: 9.6 mg/dL (ref 8.7–10.3)
Chloride: 101 mmol/L (ref 96–106)
Creatinine, Ser: 1.45 mg/dL — ABNORMAL HIGH (ref 0.57–1.00)
Globulin, Total: 2.9 g/dL (ref 1.5–4.5)
Glucose: 94 mg/dL (ref 70–99)
Potassium: 4.4 mmol/L (ref 3.5–5.2)
Sodium: 140 mmol/L (ref 134–144)
Total Protein: 6.8 g/dL (ref 6.0–8.5)
eGFR: 36 mL/min/{1.73_m2} — ABNORMAL LOW (ref >59)

## 2022-09-10 LAB — TSH+FREE T4
Free T4: 1.4 ng/dL (ref 0.82–1.77)
TSH: 0.581 u[IU]/mL (ref 0.450–4.500)

## 2022-09-10 LAB — VITAMIN D 25 HYDROXY (VIT D DEFICIENCY, FRACTURES): Vit D, 25-Hydroxy: 64.7 ng/mL (ref 30.0–100.0)

## 2022-09-10 LAB — B12 AND FOLATE PANEL
Folate: >20 ng/mL (ref >3.0)
Vitamin B-12: 863 pg/mL (ref 232–1245)

## 2022-09-12 NOTE — Progress Notes (Signed)
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

## 2022-09-14 ENCOUNTER — Other Ambulatory Visit: Payer: Self-pay | Admitting: Cardiology

## 2022-09-15 ENCOUNTER — Other Ambulatory Visit: Payer: Self-pay | Admitting: Family Medicine

## 2022-09-15 DIAGNOSIS — H2513 Age-related nuclear cataract, bilateral: Secondary | ICD-10-CM | POA: Diagnosis not present

## 2022-09-15 DIAGNOSIS — H353133 Nonexudative age-related macular degeneration, bilateral, advanced atrophic without subfoveal involvement: Secondary | ICD-10-CM | POA: Diagnosis not present

## 2022-09-15 MED ORDER — DONEPEZIL HCL 5 MG PO TABS: 5.0000 mg | ORAL_TABLET | Freq: Every day | ORAL | 1 refills | Status: DC

## 2022-09-15 NOTE — Progress Notes (Signed)
Please inform patient I sent in Donepezil 5 mg to be taken daily at bedtime. Follow up in 4 months.  In last visit the Mini Mental State Examination score was 24 suggesting some mild cognitive difficulties. This means patient might have some trouble with memory, attention, or thinking, but it's not very severe. This score could indicate the early stages of a condition like mild cognitive impairment (MCI) or early dementia, but it doesn't mean you definitely have these conditions.

## 2022-09-26 ENCOUNTER — Other Ambulatory Visit: Payer: Self-pay | Admitting: Nurse Practitioner

## 2022-09-28 ENCOUNTER — Telehealth: Payer: Self-pay | Admitting: Family Medicine

## 2022-09-28 NOTE — Telephone Encounter (Addendum)
Called in patient behalf , states that the dementia symptoms are getting worse. Patient was placed on medication to help but daughter is thinking things are getting worse. Patient is seeing people in the home that are not there , also seeing the grass being as tall as she is. Patient is more snappy than usual.  Runell Gess  wants a call back in regard

## 2022-09-29 ENCOUNTER — Other Ambulatory Visit: Payer: Self-pay | Admitting: Family Medicine

## 2022-09-29 DIAGNOSIS — R4189 Other symptoms and signs involving cognitive functions and awareness: Secondary | ICD-10-CM

## 2022-09-29 NOTE — Telephone Encounter (Signed)
Please let her know Referral placed to neurology for further evaluation

## 2022-09-30 ENCOUNTER — Ambulatory Visit: Payer: Medicare Other | Attending: Cardiology | Admitting: *Deleted

## 2022-09-30 DIAGNOSIS — Z5181 Encounter for therapeutic drug level monitoring: Secondary | ICD-10-CM

## 2022-09-30 DIAGNOSIS — I4891 Unspecified atrial fibrillation: Secondary | ICD-10-CM | POA: Diagnosis not present

## 2022-09-30 LAB — POCT INR: INR: 2.2 (ref 2.0–3.0)

## 2022-09-30 NOTE — Telephone Encounter (Signed)
Teresa aware ?

## 2022-09-30 NOTE — Patient Instructions (Signed)
Continue warfarin 1/2 tablet daily except 1 tablet on Mondays, Wednesdays and Fridays Recheck in 4 weeks 

## 2022-10-01 DIAGNOSIS — H2513 Age-related nuclear cataract, bilateral: Secondary | ICD-10-CM | POA: Diagnosis not present

## 2022-10-01 DIAGNOSIS — H2511 Age-related nuclear cataract, right eye: Secondary | ICD-10-CM | POA: Diagnosis not present

## 2022-10-10 ENCOUNTER — Other Ambulatory Visit: Payer: Self-pay | Admitting: Cardiology

## 2022-10-10 DIAGNOSIS — Z86718 Personal history of other venous thrombosis and embolism: Secondary | ICD-10-CM

## 2022-10-11 NOTE — Telephone Encounter (Signed)
Warfarin 4mg  refill DVT & PE Last INR 09/30/22 Last OV 05/06/22

## 2022-10-12 ENCOUNTER — Telehealth: Payer: Self-pay | Admitting: Cardiology

## 2022-10-12 NOTE — Telephone Encounter (Signed)
Patient is asking that you give her a call. Please advise

## 2022-10-12 NOTE — Telephone Encounter (Signed)
Returned call to pt, pt wanted a Coumadin Clinic appt advised pt she has an upcoming appt in the Coumadin Clinic in [place] on 10/28/22 at 11:15am.  Pt wrote down appt date and time, and thanked me for the call back.

## 2022-10-15 DIAGNOSIS — H2512 Age-related nuclear cataract, left eye: Secondary | ICD-10-CM | POA: Diagnosis not present

## 2022-10-15 DIAGNOSIS — H2513 Age-related nuclear cataract, bilateral: Secondary | ICD-10-CM | POA: Diagnosis not present

## 2022-10-28 ENCOUNTER — Ambulatory Visit: Payer: Medicare Other | Attending: Cardiology | Admitting: *Deleted

## 2022-10-28 DIAGNOSIS — I4891 Unspecified atrial fibrillation: Secondary | ICD-10-CM

## 2022-10-28 DIAGNOSIS — Z5181 Encounter for therapeutic drug level monitoring: Secondary | ICD-10-CM | POA: Diagnosis not present

## 2022-10-28 LAB — POCT INR: INR: 2.7 (ref 2.0–3.0)

## 2022-10-28 NOTE — Patient Instructions (Signed)
Continue warfarin 1/2 tablet daily except 1 tablet on Mondays, Wednesdays and Fridays Recheck in 4 weeks 

## 2022-11-03 ENCOUNTER — Other Ambulatory Visit: Payer: Medicare Other

## 2022-11-05 ENCOUNTER — Other Ambulatory Visit: Payer: Self-pay | Admitting: Family Medicine

## 2022-11-05 DIAGNOSIS — R053 Chronic cough: Secondary | ICD-10-CM

## 2022-11-10 ENCOUNTER — Encounter: Payer: Self-pay | Admitting: Cardiology

## 2022-11-10 ENCOUNTER — Ambulatory Visit: Payer: Medicare Other | Attending: Cardiology | Admitting: Cardiology

## 2022-11-10 VITALS — BP 136/62 | HR 69 | Ht 62.0 in | Wt 100.4 lb

## 2022-11-10 DIAGNOSIS — I10 Essential (primary) hypertension: Secondary | ICD-10-CM | POA: Diagnosis not present

## 2022-11-10 DIAGNOSIS — I350 Nonrheumatic aortic (valve) stenosis: Secondary | ICD-10-CM

## 2022-11-10 DIAGNOSIS — E782 Mixed hyperlipidemia: Secondary | ICD-10-CM | POA: Diagnosis not present

## 2022-11-10 DIAGNOSIS — Z86718 Personal history of other venous thrombosis and embolism: Secondary | ICD-10-CM | POA: Diagnosis not present

## 2022-11-10 DIAGNOSIS — I25119 Atherosclerotic heart disease of native coronary artery with unspecified angina pectoris: Secondary | ICD-10-CM

## 2022-11-10 NOTE — Progress Notes (Signed)
Cardiology Office Note  Date: 11/10/2022   ID: Danielle Murphy, DOB 1940/02/24, MRN [id number]  History of Present Illness: Danielle Murphy is an 82 y.o. female last seen in April.  She is here today with her son for a follow-up visit.  She states that she has not developed any new symptoms since last encounter, specifically no exertional chest pain, no worsening shortness of breath, no palpitations or syncope.  She is on Coumadin with follow-up in the anticoagulation clinic, prior history of recurrent DVT and pulmonary embolus.  She does not report any spontaneous bleeding problems.  INR 2.7 by most recent check.  I reviewed her medications.  Current cardiac regimen includes aspirin, Cozaar, Toprol-XL, Crestor, and Coumadin.  She is no longer on Lasix.  We discussed the results of her follow-up echocardiogram in August at which point aortic stenosis was stable and moderate range.  Physical Exam: VS:  BP 136/62   Pulse 69   Ht 5\' 2"  (1.575 m)   Wt 100 lb 6.4 oz (45.5 kg)   SpO2 97%   BMI 18.36 kg/m , BMI Body mass index is 18.36 kg/m.  Wt Readings from Last 3 Encounters:  11/10/22 100 lb 6.4 oz (45.5 kg)  09/09/22 106 lb (48.1 kg)  07/13/22 112 lb (50.8 kg)    General: Patient appears comfortable at rest. HEENT: Conjunctiva and lids normal. Neck: Supple, no elevated JVP or carotid bruits. Lungs: Clear to auscultation, nonlabored breathing at rest. Cardiac: Regular rate and rhythm, no S3, 3/6 systolic murmur. Extremities: No pitting edema.  ECG:  An ECG dated 04/13/2022 was personally reviewed today and demonstrated:  Sinus rhythm with prolonged PR interval, decreased R wave progression.  Labwork: 04/26/2022: Hemoglobin 11.9; Platelets 390 09/09/2022: ALT 14; AST 30; BUN 13; Creatinine, Ser 1.45; Potassium 4.4; Sodium 140; TSH 0.581     Component Value Date/Time   CHOL 147 04/26/2022 1027   TRIG 74 04/26/2022 1027   HDL 48 04/26/2022 1027   CHOLHDL 3.1 04/26/2022 1027   LDLCALC  84 04/26/2022 1027   Other Studies Reviewed Today:  Echocardiogram 08/31/2022:  1. Left ventricular ejection fraction, by estimation, is 60 to 65%. The  left ventricle has normal function. The left ventricle has no regional  wall motion abnormalities. Left ventricular diastolic parameters are  consistent with Grade I diastolic  dysfunction (impaired relaxation).   2. Right ventricular systolic function is normal. The right ventricular  size is normal. There is normal pulmonary artery systolic pressure.   3. The mitral valve is normal in structure. Mild mitral valve  regurgitation. No evidence of mitral stenosis.   4. The aortic valve is tricuspid. There is moderate calcification of the  aortic valve. There is moderate thickening of the aortic valve. Aortic  valve regurgitation is mild. Mild to moderate aortic valve stenosis.  Aortic valve area, by VTI measures 1.08  cm. Aortic valve mean gradient measures 17.0 mmHg. Aortic valve Vmax  measures 3.01 m/s. DVI is 0.33.   5. The inferior vena cava is normal in size with <50% respiratory  variability, suggesting right atrial pressure of 8 mmHg.   Assessment and Plan:  1.  Calcific degenerative aortic stenosis.  Relatively stable and moderate range with echocardiogram in August showing mean AV gradient 17 mmHg and dimensionless index 0.33.  This is also supported by a CT aortic valve calcium score of 195 in January.  No definite change in symptomatology or murmur.  Continue observation at this point with  57-month clinical reassessment.   2.  CAD status post BMS to the LAD in 2002.  LVEF 60 to 65% by echocardiogram in August.  She does not describe any active angina.  Continue aspirin and Crestor.   3.  Primary hypertension.  No change in current regimen including Cozaar and Toprol-XL.   4.  Mixed hyperlipidemia.  Continue Crestor.   5.  History of recurrent DVT and pulmonary embolus, on chronic anticoagulation.  She continues on  Coumadin.  Disposition:  Follow up  6 months.  Signed, Jonelle Sidle, M.D., F.A.C.C. [facility]

## 2022-11-10 NOTE — Patient Instructions (Addendum)

## 2022-11-17 ENCOUNTER — Other Ambulatory Visit: Payer: Self-pay | Admitting: Cardiology

## 2022-11-24 ENCOUNTER — Ambulatory Visit: Payer: Medicare Other | Attending: Cardiology | Admitting: *Deleted

## 2022-11-24 DIAGNOSIS — Z5181 Encounter for therapeutic drug level monitoring: Secondary | ICD-10-CM

## 2022-11-24 DIAGNOSIS — I4891 Unspecified atrial fibrillation: Secondary | ICD-10-CM

## 2022-11-24 LAB — POCT INR: INR: 3.9 — AB (ref 2.0–3.0)

## 2022-11-24 NOTE — Patient Instructions (Signed)
Hold warfarin tonight then resume 1/2 tablet daily except 1 tablet on Mondays, Wednesdays and Fridays Eat extra greens today Recheck in 3 weeks

## 2022-12-15 ENCOUNTER — Ambulatory Visit: Payer: Medicare Other | Attending: Cardiology

## 2022-12-15 DIAGNOSIS — Z5181 Encounter for therapeutic drug level monitoring: Secondary | ICD-10-CM

## 2022-12-15 DIAGNOSIS — I4891 Unspecified atrial fibrillation: Secondary | ICD-10-CM

## 2022-12-15 LAB — POCT INR: INR: 2.7 (ref 2.0–3.0)

## 2022-12-15 NOTE — Patient Instructions (Signed)
Description   Continue on same dosage of Warfarin 1/2 tablet daily except 1 tablet on Mondays, Wednesdays and Fridays Eat extra greens today Recheck in 4 weeks

## 2023-01-07 ENCOUNTER — Telehealth: Payer: Self-pay | Admitting: Family Medicine

## 2023-01-07 NOTE — Telephone Encounter (Deleted)
Danielle Murphy would have been seen Danielle Murphy cancel last appt with her last contact appt 10.30.2024 and spoke to Danielle Murphy on 10.22.2024 for her stint right kidney, doing a biogspy for cancer on Ocotber 30, 2024 notified office through mychart speak to her need to get care team together.  Would make sure she got hte message on that Monday when she return to the office.   CT scan was mis read in July 2024.  From July to September patient suffered..  Finally got the recommendation put her a month out and had to have stint surgery done.   Verify cancel had been found and has not received no notification from any providers. [place]acner grew so fast hasd to remove more due to no call back from her provider

## 2023-01-07 NOTE — Telephone Encounter (Signed)
errror

## 2023-01-10 ENCOUNTER — Ambulatory Visit: Payer: Medicare Other | Admitting: Family Medicine

## 2023-01-12 ENCOUNTER — Ambulatory Visit: Payer: Medicare Other | Attending: Cardiology | Admitting: *Deleted

## 2023-01-12 DIAGNOSIS — Z5181 Encounter for therapeutic drug level monitoring: Secondary | ICD-10-CM | POA: Diagnosis not present

## 2023-01-12 DIAGNOSIS — I4891 Unspecified atrial fibrillation: Secondary | ICD-10-CM | POA: Diagnosis not present

## 2023-01-12 LAB — POCT INR: INR: 3 (ref 2.0–3.0)

## 2023-01-12 NOTE — Patient Instructions (Signed)
Continue on same dosage of Warfarin 1/2 tablet daily except 1 tablet on Mondays, Wednesdays and Fridays Eat extra greens today Recheck in 4 weeks

## 2023-02-09 ENCOUNTER — Ambulatory Visit: Payer: Medicare Other | Attending: Cardiology | Admitting: *Deleted

## 2023-02-09 DIAGNOSIS — Z5181 Encounter for therapeutic drug level monitoring: Secondary | ICD-10-CM

## 2023-02-09 DIAGNOSIS — I4891 Unspecified atrial fibrillation: Secondary | ICD-10-CM

## 2023-02-09 LAB — POCT INR: INR: 1.8 — AB (ref 2.0–3.0)

## 2023-02-09 NOTE — Patient Instructions (Signed)
 Take warfarin 1 /2 tablets tonight then resume 1/2 tablet daily except 1 tablet on Mondays, Wednesdays and Fridays Be consistent with greens Recheck in 3 weeks

## 2023-02-12 ENCOUNTER — Other Ambulatory Visit: Payer: Self-pay | Admitting: Cardiology

## 2023-02-16 ENCOUNTER — Ambulatory Visit: Payer: Medicare Other | Admitting: Family Medicine

## 2023-03-02 ENCOUNTER — Ambulatory Visit: Payer: Medicare Other | Attending: Cardiology | Admitting: *Deleted

## 2023-03-02 DIAGNOSIS — Z5181 Encounter for therapeutic drug level monitoring: Secondary | ICD-10-CM | POA: Diagnosis not present

## 2023-03-02 DIAGNOSIS — I4891 Unspecified atrial fibrillation: Secondary | ICD-10-CM

## 2023-03-02 LAB — POCT INR: POC INR: 1.9

## 2023-03-02 NOTE — Patient Instructions (Signed)
 Description    Take 1.5 tablets of warfarin today Then START taking warfarin 1 tablet daily except for 1/2 a tablet on Tuesday, Thursday and Saturday. Recheck INR in 2 weeks.

## 2023-03-03 ENCOUNTER — Other Ambulatory Visit: Payer: Self-pay | Admitting: Cardiology

## 2023-03-03 DIAGNOSIS — Z86718 Personal history of other venous thrombosis and embolism: Secondary | ICD-10-CM

## 2023-03-03 NOTE — Telephone Encounter (Signed)
 Prescription refill request received for warfarin Lov: [facility], 11/10/2022 Next INR check: 2/19 Warfarin tablet strength: 4mg 

## 2023-03-11 ENCOUNTER — Telehealth: Payer: Self-pay | Admitting: Family Medicine

## 2023-03-11 NOTE — Telephone Encounter (Signed)
Patient son wanting to talk to the nurse in regards to patient's dementia- says it is getting worse and wanting to discuss before pt appt. Please advise [phone] Thank You

## 2023-03-13 ENCOUNTER — Other Ambulatory Visit: Payer: Self-pay | Admitting: Family Medicine

## 2023-03-14 ENCOUNTER — Other Ambulatory Visit: Payer: Self-pay | Admitting: Family Medicine

## 2023-03-14 DIAGNOSIS — R053 Chronic cough: Secondary | ICD-10-CM

## 2023-03-15 ENCOUNTER — Other Ambulatory Visit: Payer: Self-pay | Admitting: Family Medicine

## 2023-03-15 MED ORDER — MEMANTINE HCL-DONEPEZIL HCL ER 7-10 MG PO CP24: 1.0000 | ORAL_CAPSULE | Freq: Every day | ORAL | 2 refills | Status: DC

## 2023-03-15 NOTE — Telephone Encounter (Signed)
 Please inform,  Please stop taking Donepezil 5 mg and begin taking Memantine HCl-Donepezil HCl 7-10 mg daily which has been sent to your pharmacy.  At our next appointment, I will conduct an assessment to monitor your response to the new medication and  place a referral to neurology for further evaluation.

## 2023-03-15 NOTE — Telephone Encounter (Signed)
 Please advise on plan of treatment for below message :   Patient son wanting to talk to the nurse in regards to patient's dementia- says it is getting worse and wanting to discuss before pt appt. Please advise [phone] Thank You  Spoke to pt son  Yetta Marceaux (goes by [employer]) He states that  -her memory is getting shorter,  -she is repeating questions and directions frequently  -she is hearing voices that are not there, there will be no one in the house when she hears the voices, she has more than 500 feet to the next -neighbor so there would be no sounds to hear. She is also stating that the  voices she hears are talking about her.  - she has had an increase in agitation, to the point she is attempting to throw objects at family members but not able to enforce violence on others and herself.  - she is seeing objects that are not being seen by family members   He also states that his wife has cancer and he is taking care of her as well as his mom plus working full time and he is worried she may hurt herself when she has a episode while he is not there.

## 2023-03-16 ENCOUNTER — Ambulatory Visit: Payer: Medicare Other

## 2023-03-16 NOTE — Telephone Encounter (Signed)
 Spoke to son. Gave treatment plan . He understood and will see you at the next appt.

## 2023-03-23 ENCOUNTER — Ambulatory Visit: Payer: Medicare Other | Attending: Cardiology | Admitting: *Deleted

## 2023-03-23 DIAGNOSIS — Z5181 Encounter for therapeutic drug level monitoring: Secondary | ICD-10-CM | POA: Diagnosis not present

## 2023-03-23 DIAGNOSIS — I4891 Unspecified atrial fibrillation: Secondary | ICD-10-CM | POA: Diagnosis not present

## 2023-03-23 LAB — POCT INR: INR: 4.6 — AB (ref 2.0–3.0)

## 2023-03-23 NOTE — Patient Instructions (Signed)
 Hold warfarin tonight and tomorrow night then decrease dose to 1/2 tablet daily except 1 tablet on Mondays, Wednesdays and Fridays. Recheck INR in 2 weeks.

## 2023-04-01 ENCOUNTER — Ambulatory Visit (INDEPENDENT_AMBULATORY_CARE_PROVIDER_SITE_OTHER): Payer: Medicare Other | Admitting: Family Medicine

## 2023-04-01 ENCOUNTER — Encounter: Payer: Self-pay | Admitting: Family Medicine

## 2023-04-01 VITALS — BP 139/70 | HR 68 | Ht 62.0 in | Wt 109.1 lb

## 2023-04-01 DIAGNOSIS — I10 Essential (primary) hypertension: Secondary | ICD-10-CM

## 2023-04-01 DIAGNOSIS — R7301 Impaired fasting glucose: Secondary | ICD-10-CM | POA: Diagnosis not present

## 2023-04-01 DIAGNOSIS — E038 Other specified hypothyroidism: Secondary | ICD-10-CM

## 2023-04-01 DIAGNOSIS — R053 Chronic cough: Secondary | ICD-10-CM | POA: Diagnosis not present

## 2023-04-01 DIAGNOSIS — E538 Deficiency of other specified B group vitamins: Secondary | ICD-10-CM | POA: Diagnosis not present

## 2023-04-01 DIAGNOSIS — R4189 Other symptoms and signs involving cognitive functions and awareness: Secondary | ICD-10-CM

## 2023-04-01 DIAGNOSIS — E559 Vitamin D deficiency, unspecified: Secondary | ICD-10-CM | POA: Diagnosis not present

## 2023-04-01 DIAGNOSIS — R4689 Other symptoms and signs involving appearance and behavior: Secondary | ICD-10-CM

## 2023-04-01 MED ORDER — LOSARTAN POTASSIUM 50 MG PO TABS: 50.0000 mg | ORAL_TABLET | Freq: Every day | ORAL | 1 refills | Status: DC

## 2023-04-01 NOTE — Assessment & Plan Note (Addendum)
 Patient's son-in-law reports that they are unable to afford the newly prescribed medication, memantine-donepezil 7-10 mg, due to the cost.  Labs ordered. A referral has been placed to neurology for further evaluation

## 2023-04-01 NOTE — Patient Instructions (Signed)
        Great to see you today.  I have refilled the medication(s) we provide.   Walk in for Lab work:  [facility] [address] [place], [place] [address]  [phone] Monday- Friday: 8?AM-12:30?PM, 1:30-5?PM     If labs were collected, we will inform you of lab results once received either by echart message or telephone call.   - echart message- for normal results that have been seen by the patient already.   - telephone call: abnormal results or if patient has not viewed results in their echart.   - Please take medications as prescribed. - Follow up with your primary health provider if any health concerns arises. - If symptoms worsen please contact your primary care provider and/or visit the emergency department.

## 2023-04-01 NOTE — Progress Notes (Signed)
 Established Patient Office Visit   Subjective  Patient ID: Danielle Murphy, female    DOB: 1940/08/12  Age: 83 y.o. MRN: [id number]  Chief Complaint  Patient presents with   Medical Management of Chronic Issues    4 month follow up for chronic management.  Donezepil is costing $300 after insurance the patient family is requesting a different medication.     She  has a past medical history of Aortic stenosis, Coronary atherosclerosis of native coronary artery, DVT (deep venous thrombosis) (HCC), Essential hypertension, Hyperlipidemia, Pericardial cyst, and Pulmonary embolism (HCC).  Patient reports memory problems that began almost a year ago, with symptoms gradually worsening over time. She frequently forgets appointments, conversations, and where she places items such as keys and wallet. The frequency of forgetting important information has increased. Despite these issues, she remains able to complete daily tasks independently, though she experiences difficulty with planning, organizing, and decision-making. She reports trouble recognizing familiar faces and places, along with occasional confusion about time, season, or location. Language difficulties include finding the right words in conversations and repeating herself. Behavioral changes include increased withdrawal and mild irritability. Family history is significant for Alzheimer's disease and similar memory problems in aging relatives. She denies changes in sleep patterns or recent head injuries. Medical history includes hypertension. The patient previously took Donepezil for six months but stopped two weeks ago, reporting improved mood since discontinuation.      Review of Systems  Constitutional:  Negative for chills and fever.  Eyes:  Negative for blurred vision.  Respiratory:  Negative for shortness of breath.   Cardiovascular:  Negative for chest pain.  Neurological:  Negative for dizziness and headaches.      Objective:      BP 139/70   Pulse 68   Ht 5\' 2"  (1.575 m)   Wt 109 lb 1.3 oz (49.5 kg)   SpO2 96%   BMI 19.95 kg/m  BP Readings from Last 3 Encounters:  04/01/23 139/70  11/10/22 136/62  09/09/22 120/67      Physical Exam Vitals reviewed.  Constitutional:      General: She is not in acute distress.    Appearance: Normal appearance. She is not ill-appearing, toxic-appearing or diaphoretic.  HENT:     Head: Normocephalic.     Right Ear: Tympanic membrane normal.     Left Ear: Tympanic membrane normal.  Eyes:     General:        Right eye: No discharge.        Left eye: No discharge.     Conjunctiva/sclera: Conjunctivae normal.     Pupils: Pupils are equal, round, and reactive to light.  Cardiovascular:     Rate and Rhythm: Normal rate.     Pulses: Normal pulses.  Pulmonary:     Effort: Pulmonary effort is normal. No respiratory distress.     Breath sounds: Normal breath sounds.  Abdominal:     General: Bowel sounds are normal.     Palpations: Abdomen is soft.     Tenderness: There is no abdominal tenderness. There is no right CVA tenderness, left CVA tenderness or guarding.  Skin:    General: Skin is warm and dry.     Capillary Refill: Capillary refill takes less than 2 seconds.  Neurological:     Mental Status: She is alert.  Psychiatric:        Mood and Affect: Mood normal.        Behavior: Behavior normal.  No results found for any visits on 04/01/23.  The ASCVD Risk score (Arnett DK, et al., 2019) failed to calculate for the following reasons:   The 2019 ASCVD risk score is only valid for ages 19 to 64    Assessment & Plan:  Primary hypertension -     Lipid panel -     CMP14+EGFR -     CBC with Differential/Platelet  TSH (thyroid-stimulating hormone deficiency) -     TSH + free T4  Vitamin D deficiency -     VITAMIN D 25 Hydroxy (Vit-D Deficiency, Fractures)  IFG (impaired fasting glucose) -     Hemoglobin A1c  Vitamin B12 deficiency -     Vitamin  B12  Chronic cough  Cognitive and behavioral changes -     Ambulatory referral to Neurology  Cognitive change Assessment & Plan: Patient's son-in-law reports that they are unable to afford the newly prescribed medication, memantine-donepezil 7-10 mg, due to the cost.  Labs ordered. A referral has been placed to neurology for further evaluation   Essential hypertension Assessment & Plan: Patient is being followed by Cardiology Patient reports taking Losartan 50 mg and Metoprolol 50 mg daily.  Labs ordered. Discussed with  patient to monitor their blood pressure regularly and maintain a heart-healthy diet rich in fruits, vegetables, whole grains, and low-fat dairy, while reducing sodium intake to less than 2,300 mg per day. Regular physical activity, such as 30 minutes of moderate exercise most days of the week, will help lower blood pressure and improve overall cardiovascular health. Avoiding smoking, limiting alcohol consumption, and managing stress. Take  prescribed medication, & take it as directed and avoid skipping doses. Seek emergency care if your blood pressure is (over 180/100) or you experience chest pain, shortness of breath, or sudden vision changes.Patient verbalizes understanding regarding plan of care and all questions answered.    Other orders -     Losartan Potassium; Take 1 tablet (50 mg total) by mouth daily.  Dispense: 90 tablet; Refill: 1    Return in about 6 months (around 10/02/2023), or if symptoms worsen or fail to improve, for chronic follow-up.   Cruzita Lederer Newman Nip, FNP

## 2023-04-01 NOTE — Assessment & Plan Note (Signed)
 Patient is being followed by Cardiology Patient reports taking Losartan 50 mg and Metoprolol 50 mg daily.  Labs ordered. Discussed with  patient to monitor their blood pressure regularly and maintain a heart-healthy diet rich in fruits, vegetables, whole grains, and low-fat dairy, while reducing sodium intake to less than 2,300 mg per day. Regular physical activity, such as 30 minutes of moderate exercise most days of the week, will help lower blood pressure and improve overall cardiovascular health. Avoiding smoking, limiting alcohol consumption, and managing stress. Take  prescribed medication, & take it as directed and avoid skipping doses. Seek emergency care if your blood pressure is (over 180/100) or you experience chest pain, shortness of breath, or sudden vision changes.Patient verbalizes understanding regarding plan of care and all questions answered.

## 2023-04-06 ENCOUNTER — Ambulatory Visit: Payer: Medicare Other | Attending: Cardiology | Admitting: *Deleted

## 2023-04-06 DIAGNOSIS — I4891 Unspecified atrial fibrillation: Secondary | ICD-10-CM

## 2023-04-06 DIAGNOSIS — Z5181 Encounter for therapeutic drug level monitoring: Secondary | ICD-10-CM | POA: Diagnosis not present

## 2023-04-06 LAB — POCT INR: INR: 2 (ref 2.0–3.0)

## 2023-04-06 NOTE — Patient Instructions (Signed)
 Continue warfarin 1/2 tablet daily except 1 tablet on Mondays, Wednesdays and Fridays. Recheck INR in 4 weeks.

## 2023-05-04 ENCOUNTER — Ambulatory Visit: Attending: Cardiology | Admitting: *Deleted

## 2023-05-04 DIAGNOSIS — I4891 Unspecified atrial fibrillation: Secondary | ICD-10-CM | POA: Diagnosis not present

## 2023-05-04 DIAGNOSIS — Z5181 Encounter for therapeutic drug level monitoring: Secondary | ICD-10-CM

## 2023-05-04 LAB — POCT INR: INR: 2.7 (ref 2.0–3.0)

## 2023-05-04 NOTE — Patient Instructions (Signed)
 Continue warfarin 1/2 tablet daily except 1 tablet on Mondays, Wednesdays and Fridays. Recheck INR in 4 weeks.

## 2023-05-11 ENCOUNTER — Other Ambulatory Visit: Payer: Self-pay | Admitting: Cardiology

## 2023-05-11 DIAGNOSIS — Z86718 Personal history of other venous thrombosis and embolism: Secondary | ICD-10-CM

## 2023-05-11 NOTE — Telephone Encounter (Signed)
 Refill request for warfarin:  Last INR was 2.7 on 05/04/23 Next INR due 06/01/23 LOV was 11/10/22  Refill approved.

## 2023-05-12 ENCOUNTER — Ambulatory Visit: Payer: Medicare Other | Attending: Nurse Practitioner | Admitting: Nurse Practitioner

## 2023-05-12 ENCOUNTER — Encounter: Payer: Self-pay | Admitting: Nurse Practitioner

## 2023-05-12 VITALS — BP 142/68 | HR 60 | Ht 62.0 in | Wt 116.6 lb

## 2023-05-12 DIAGNOSIS — I2510 Atherosclerotic heart disease of native coronary artery without angina pectoris: Secondary | ICD-10-CM

## 2023-05-12 DIAGNOSIS — Z86718 Personal history of other venous thrombosis and embolism: Secondary | ICD-10-CM

## 2023-05-12 DIAGNOSIS — I38 Endocarditis, valve unspecified: Secondary | ICD-10-CM | POA: Diagnosis not present

## 2023-05-12 DIAGNOSIS — I350 Nonrheumatic aortic (valve) stenosis: Secondary | ICD-10-CM

## 2023-05-12 DIAGNOSIS — Z86711 Personal history of pulmonary embolism: Secondary | ICD-10-CM

## 2023-05-12 DIAGNOSIS — E785 Hyperlipidemia, unspecified: Secondary | ICD-10-CM

## 2023-05-12 DIAGNOSIS — I10 Essential (primary) hypertension: Secondary | ICD-10-CM

## 2023-05-12 DIAGNOSIS — I359 Nonrheumatic aortic valve disorder, unspecified: Secondary | ICD-10-CM

## 2023-05-12 NOTE — Progress Notes (Signed)
 Cardiology Office Note:    Date:  05/12/2023 ID:  Danielle Murphy, DOB Aug 13, 1940, MRN [id number] PCP:  Rosanna Comment, FNP [facility] Providers Cardiologist:  Teddie Favre, MD    Referring MD: Waneta Gut*   CC: Here for 6 month follow-up  History of Present Illness:    Danielle Murphy is a 83 y.o. female with a PMH of CAD, HTN, HLD, hx of pericardial cyst, s/p excision in 2002, aortic valve stenosis, and past hx of DVT/PE, who presents today for 6 month f/u appointment.   Previous CV hx of BMS to LAD in 2002.  She has been followed at the Coumadin  clinic - history of recurrent DVT and previous PE.    Last seen by Dr. Londa Rival on November 10, 2022. Was doing well at the time.   TTE 08/2022 revealed normal LVEF, moderate aortic valve stenosis with mean gradient 17 mmHg, mild MR and mild TR.   Today she presents for follow-up with her son. Doing well. Denies any acute cardiac complaints or issues. Denies any chest pain, shortness of breath, palpitations, syncope, presyncope, dizziness, orthopnea, PND, swelling or significant weight changes, acute bleeding, or claudication.  ROS: Negative. See HPI.    SH: She stays very active, does yard work, and provides care for her adult son with Down syndrome.    Please see the history of present illness.    All other systems reviewed and are negative.  EKGs/Labs/Other Studies Reviewed:    The following studies were reviewed today:   EKG:   EKG Interpretation Date/Time:  Thursday May 12 2023 11:28:20 EDT Ventricular Rate:  78 PR Interval:  234 QRS Duration:  74 QT Interval:  408 QTC Calculation: 465 R Axis:   57  Text Interpretation: Sinus rhythm with sinus arrhythmia with 1st degree A-V block When compared with ECG of 17-Jul-2014 18:45, PREVIOUS ECG IS PRESENT Confirmed by Lasalle Pointer [phone]) on 05/12/2023 11:58:19 AM   Echo 08/2022:  1. Left ventricular ejection fraction, by estimation, is 60 to 65%.  The  left ventricle has normal function. The left ventricle has no regional  wall motion abnormalities. Left ventricular diastolic parameters are  consistent with Grade I diastolic  dysfunction (impaired relaxation).   2. Right ventricular systolic function is normal. The right ventricular  size is normal. There is normal pulmonary artery systolic pressure.   3. The mitral valve is normal in structure. Mild mitral valve  regurgitation. No evidence of mitral stenosis.   4. The aortic valve is tricuspid. There is moderate calcification of the  aortic valve. There is moderate thickening of the aortic valve. Aortic  valve regurgitation is mild. Mild to moderate aortic valve stenosis.  Aortic valve area, by VTI measures 1.08  cm. Aortic valve mean gradient measures 17.0 mmHg. Aortic valve Vmax measures 3.01 m/s. DVI is 0.33.   5. The inferior vena cava is normal in size with <50% respiratory  variability, suggesting right atrial pressure of 8 mmHg.   Comparison(s): Prior images reviewed side by side. Changes from prior  study are noted. Aortic stenosis stable, mild to moderate (mean PG stable at 17 mm Hg and V max increased from 2.8 m/s to 3 m/s now). DVI is 0.33. Aortic regurgitation is stable, mild.  CT cardiac scoring 01/2022:  IMPRESSION: Coronary calcium  score of 973. This was 109 percentile for age-, race-, and sex-matched controls.   Severe aortic atherosclerosis.  IMPRESSION: 1. No acute noncardiac abnormality identified in the  visualized portions of the chest. 2. Thoracic vertebral body compression deformities appears similar to prior chest radiograph Jun 22, 2017. 3. Tiny hiatal hernia. 4.  Aortic Atherosclerosis (ICD10-I70.0).  Myoview  on September 30, 2016: There was no ST segment deviation noted during stress. This is a low risk study. The left ventricular ejection fraction is hyperdynamic (>65%). Findings consistent with prior small apical myocardial infarction with mild  peri-infarct ischemia.  Physical Exam:    VS:  BP (!) 142/68 (BP Location: Right Arm, Cuff Size: Normal)   Pulse 60   Ht 5\' 2"  (1.575 m)   Wt 116 lb 9.6 oz (52.9 kg)   SpO2 96%   BMI 21.33 kg/m     Wt Readings from Last 3 Encounters:  05/12/23 116 lb 9.6 oz (52.9 kg)  04/01/23 109 lb 1.3 oz (49.5 kg)  11/10/22 100 lb 6.4 oz (45.5 kg)    GEN: Thin, frail 83 y.o. female in no acute distress HEENT: Normal NECK: No JVD; No carotid bruits CARDIAC: S1/S2, RRR, Grade 3/6 systolic murmur heard throughout with radiation to carotids, no rubs or gallops noted.  2+ peripheral pulses throughout, strong and equal bilaterally RESPIRATORY:  Clear and diminished to auscultation without rales, wheezing or rhonchi  MUSCULOSKELETAL:  No edema; thoracic kyphosis noted, otherwise no other deformity noted SKIN: Thin skin, warm and dry NEUROLOGIC:  Alert and oriented x 3 PSYCHIATRIC:  Normal affect   ASSESSMENT & PLAN:    In order of problems listed above:  CAD, s/p BMS to LAD in 2002 Stable with no anginal symptoms. No indication for ischemic evaluation.  Continue Toprol -XL, crestor , and losartan .  Heart healthy diet and regular cardiovascular exercise encouraged.   Hypertension Blood pressure mildly elevated, home BP well controlled. Discussed to monitor BP at home at least 2 hours after medications and sitting for 5-10 minutes.  Discussed to log BP and let us  know if consistently elevated SBP greater than 140.  Continue current medication regimen. Heart healthy diet and regular cardiovascular exercise encouraged.    Hyperlipidemia LDL 04/2022 was 84. Has upcoming labs arranged. Continue Crestor . Heart healthy diet and regular cardiovascular exercise encouraged.   Aortic stenosis Echo 08/2022 showed moderate aortic valve stenosis per review of Dr. Londa Rival with mean AV gradient of 17 mmHg. Denies any symptoms. Will update Echo at this time.  Care and ED precautions discussed. Heart healthy diet and  regular cardiovascular exercise encouraged.   5.   History of DVT/PE Denies any leg swelling or claudication issues.  Denies any acute bleeding while on Coumadin .  Continue to follow-up with Coumadin  clinic as scheduled.  Continue Coumadin  dosing as instructed.   6.  Valvular insufficiency Echocardiogram 08/2022 revealed mild MR, mild TR. Denies any symptoms. Will continue to monitor with serial Echos.     7.  Disposition: Follow-up with Dr. Londa Rival or APP in 6 months or sooner if anything changes.   Medication Adjustments/Labs and Tests Ordered: Current medicines are reviewed at length with the patient today.  Concerns regarding medicines are outlined above.  Orders Placed This Encounter  Procedures   EKG 12-Lead   ECHOCARDIOGRAM COMPLETE   No orders of the defined types were placed in this encounter.   Patient Instructions  Medication Instructions:  Your physician recommends that you continue on your current medications as directed. Please refer to the Current Medication list given to you today.  Labwork: None   Testing/Procedures: Your physician has requested that you have an echocardiogram. Echocardiography is a painless test  that uses sound waves to create images of your heart. It provides your doctor with information about the size and shape of your heart and how well your heart's chambers and valves are working. This procedure takes approximately one hour. There are no restrictions for this procedure. Please do NOT wear cologne, perfume, aftershave, or lotions (deodorant is allowed). Please arrive 15 minutes prior to your appointment time.  Please note: We ask at that you not bring children with you during ultrasound (echo/ vascular) testing. Due to room size and safety concerns, children are not allowed in the ultrasound rooms during exams. Our front office staff cannot provide observation of children in our lobby area while testing is being conducted. An adult accompanying a  patient to their appointment will only be allowed in the ultrasound room at the discretion of the ultrasound technician under special circumstances. We apologize for any inconvenience.  Follow-Up: Your physician recommends that you schedule a follow-up appointment in: 6 months   Any Other Special Instructions Will Be Listed Below (If Applicable).  If you need a refill on your cardiac medications before your next appointment, please call your pharmacy.   Signed, Lasalle Pointer, NP

## 2023-05-12 NOTE — Patient Instructions (Addendum)

## 2023-05-15 ENCOUNTER — Telehealth: Payer: Self-pay | Admitting: Cardiology

## 2023-05-30 NOTE — Telephone Encounter (Unsigned)
 Copied from CRM [id number]. Topic: General - Other >> May 30, 2023  3:55 PM Lynnie Saucier S wrote: Reason for CRM: Ammon Bales, daughter in law, states that patient needs a written statement for lawyer for patient's beginning stages of dementia. Callback number is [phone].

## 2023-06-01 ENCOUNTER — Ambulatory Visit: Attending: Cardiology | Admitting: *Deleted

## 2023-06-01 ENCOUNTER — Ambulatory Visit

## 2023-06-01 DIAGNOSIS — I4891 Unspecified atrial fibrillation: Secondary | ICD-10-CM | POA: Diagnosis not present

## 2023-06-01 DIAGNOSIS — I350 Nonrheumatic aortic (valve) stenosis: Secondary | ICD-10-CM | POA: Diagnosis not present

## 2023-06-01 DIAGNOSIS — Z5181 Encounter for therapeutic drug level monitoring: Secondary | ICD-10-CM

## 2023-06-01 LAB — POCT INR: INR: 2.9 (ref 2.0–3.0)

## 2023-06-01 NOTE — Patient Instructions (Signed)
 Continue warfarin 1/2 tablet daily except 1 tablet on Mondays, Wednesdays and Fridays. Recheck INR in 5 weeks.

## 2023-06-01 NOTE — Telephone Encounter (Signed)
 Please advise if requested letter is approved. :   Reason for CRM: Ammon Bales, daughter in law, states that patient needs a written statement for lawyer for patient's beginning stages of dementia. Callback number is [phone]

## 2023-06-02 LAB — ECHOCARDIOGRAM COMPLETE
AR max vel: 0.87 cm2
AV Area VTI: 1.16 cm2
AV Area mean vel: 1.1 cm2
AV Mean grad: 17 mmHg
AV Peak grad: 35.1 mmHg
AV Vena cont: 0.4 cm
Ao pk vel: 2.96 m/s
Area-P 1/2: 3.4 cm2
Calc EF: 69 %
P 1/2 time: 382 ms
S' Lateral: 2.2 cm
Single Plane A2C EF: 67.9 %
Single Plane A4C EF: 69.9 %

## 2023-06-02 NOTE — Telephone Encounter (Signed)
 Please inform- I have placed multiple referrals to Neurology regarding patient cognitive concerns. Danielle Murphy Please share Neurology contact information office so they can directly  schedule an appointment, as I can only provide a letter stating findings suggestive of dementia but cannot confirm a diagnosis. Neurology will be able to provide a full evaluation. Kindly provide a phone number

## 2023-06-03 NOTE — Telephone Encounter (Signed)
 P.t has been contacted at [facility] Number. And given providers response.  She was unable to write at this moment. S he has been explained to about why we can not provide the documentation, however when Daughter calls back Please give her the Neurology contact for scheduling which is :  [facility] at [address] , [place], [place] [address]  [phone]

## 2023-06-12 ENCOUNTER — Other Ambulatory Visit: Payer: Self-pay | Admitting: Nurse Practitioner

## 2023-07-06 ENCOUNTER — Ambulatory Visit: Attending: Cardiology | Admitting: *Deleted

## 2023-07-06 DIAGNOSIS — Z5181 Encounter for therapeutic drug level monitoring: Secondary | ICD-10-CM | POA: Diagnosis not present

## 2023-07-06 DIAGNOSIS — I4891 Unspecified atrial fibrillation: Secondary | ICD-10-CM | POA: Diagnosis not present

## 2023-07-06 LAB — POCT INR: INR: 3 (ref 2.0–3.0)

## 2023-07-06 NOTE — Patient Instructions (Signed)
 Continue warfarin 1/2 tablet daily except 1 tablet on Mondays, Wednesdays and Fridays.  Increase greens/salads Recheck INR in 6 weeks.

## 2023-08-15 ENCOUNTER — Encounter: Payer: Self-pay | Admitting: Physician Assistant

## 2023-08-17 ENCOUNTER — Ambulatory Visit: Attending: Cardiology | Admitting: *Deleted

## 2023-08-17 DIAGNOSIS — I4891 Unspecified atrial fibrillation: Secondary | ICD-10-CM

## 2023-08-17 DIAGNOSIS — Z5181 Encounter for therapeutic drug level monitoring: Secondary | ICD-10-CM | POA: Diagnosis not present

## 2023-08-17 LAB — POCT INR: INR: 4.2 — AB (ref 2.0–3.0)

## 2023-08-17 NOTE — Patient Instructions (Signed)
 Hold warfarin tonight and tomorrow night then resume 1/2 tablet daily except 1 tablet on Mondays, Wednesdays and Fridays.  Increase greens/salads Recheck INR in 3 weeks.

## 2023-08-17 NOTE — Progress Notes (Signed)
 Please see anticoagulation encounter 4.2

## 2023-09-07 ENCOUNTER — Ambulatory Visit: Attending: Cardiology | Admitting: *Deleted

## 2023-09-07 DIAGNOSIS — Z5181 Encounter for therapeutic drug level monitoring: Secondary | ICD-10-CM

## 2023-09-07 DIAGNOSIS — I4891 Unspecified atrial fibrillation: Secondary | ICD-10-CM

## 2023-09-07 LAB — POCT INR: INR: 5.1 — AB (ref 2.0–3.0)

## 2023-09-07 NOTE — Progress Notes (Signed)
 INR 5.1; Please see anticoagulation encounter

## 2023-09-07 NOTE — Patient Instructions (Signed)
 Hold warfarin tonight and tomorrow night then decrease dose to 1/2 tablet daily except 1 tablet on Wednesdays.  Increase greens/salads Recheck INR in 2 weeks.

## 2023-09-10 ENCOUNTER — Other Ambulatory Visit: Payer: Self-pay | Admitting: Cardiology

## 2023-09-16 ENCOUNTER — Encounter: Payer: Self-pay | Admitting: Radiology

## 2023-09-20 ENCOUNTER — Encounter

## 2023-10-05 ENCOUNTER — Ambulatory Visit: Attending: Cardiology | Admitting: *Deleted

## 2023-10-05 DIAGNOSIS — E038 Other specified hypothyroidism: Secondary | ICD-10-CM | POA: Diagnosis not present

## 2023-10-05 DIAGNOSIS — E559 Vitamin D deficiency, unspecified: Secondary | ICD-10-CM | POA: Diagnosis not present

## 2023-10-05 DIAGNOSIS — E538 Deficiency of other specified B group vitamins: Secondary | ICD-10-CM | POA: Diagnosis not present

## 2023-10-05 DIAGNOSIS — I4891 Unspecified atrial fibrillation: Secondary | ICD-10-CM | POA: Diagnosis not present

## 2023-10-05 DIAGNOSIS — Z5181 Encounter for therapeutic drug level monitoring: Secondary | ICD-10-CM | POA: Diagnosis not present

## 2023-10-05 DIAGNOSIS — R7301 Impaired fasting glucose: Secondary | ICD-10-CM | POA: Diagnosis not present

## 2023-10-05 LAB — POCT INR: INR: 3.2 — AB (ref 2.0–3.0)

## 2023-10-05 NOTE — Patient Instructions (Signed)
 Decrease warfarin to 1/2 tablet daily.  Continue greens/salads Recheck INR in 3 weeks.

## 2023-10-05 NOTE — Progress Notes (Signed)
 INR 3.2 Please see anticoagulation encounter

## 2023-10-06 ENCOUNTER — Ambulatory Visit: Payer: Self-pay | Admitting: Family Medicine

## 2023-10-06 LAB — CBC WITH DIFFERENTIAL/PLATELET
Basophils Absolute: 0 x10E3/uL (ref 0.0–0.2)
Basos: 0 %
EOS (ABSOLUTE): 0.1 x10E3/uL (ref 0.0–0.4)
Eos: 2 %
Hematocrit: 34.7 % (ref 34.0–46.6)
Hemoglobin: 11.6 g/dL (ref 11.1–15.9)
Immature Grans (Abs): 0 x10E3/uL (ref 0.0–0.1)
Immature Granulocytes: 0 %
Lymphocytes Absolute: 1.2 x10E3/uL (ref 0.7–3.1)
Lymphs: 25 %
MCH: 31.1 pg (ref 26.6–33.0)
MCHC: 33.4 g/dL (ref 31.5–35.7)
MCV: 93 fL (ref 79–97)
Monocytes Absolute: 0.5 x10E3/uL (ref 0.1–0.9)
Monocytes: 9 %
Neutrophils Absolute: 3.1 x10E3/uL (ref 1.4–7.0)
Neutrophils: 63 %
Platelets: 286 x10E3/uL (ref 150–450)
RBC: 3.73 x10E6/uL — ABNORMAL LOW (ref 3.77–5.28)
RDW: 12.2 % (ref 11.7–15.4)
WBC: 5 x10E3/uL (ref 3.4–10.8)

## 2023-10-06 LAB — LIPID PANEL
Chol/HDL Ratio: 2.6 ratio (ref 0.0–4.4)
Cholesterol, Total: 136 mg/dL (ref 100–199)
HDL: 52 mg/dL (ref >39)
LDL Chol Calc (NIH): 70 mg/dL (ref 0–99)
Triglycerides: 69 mg/dL (ref 0–149)
VLDL Cholesterol Cal: 14 mg/dL (ref 5–40)

## 2023-10-06 LAB — CMP14+EGFR
ALT: 14 IU/L (ref 0–32)
AST: 27 IU/L (ref 0–40)
Albumin: 4 g/dL (ref 3.7–4.7)
Alkaline Phosphatase: 114 IU/L (ref 44–121)
BUN/Creatinine Ratio: 10 — ABNORMAL LOW (ref 12–28)
BUN: 13 mg/dL (ref 8–27)
Bilirubin Total: 0.6 mg/dL (ref 0.0–1.2)
CO2: 22 mmol/L (ref 20–29)
Calcium: 9.8 mg/dL (ref 8.7–10.3)
Chloride: 98 mmol/L (ref 96–106)
Creatinine, Ser: 1.3 mg/dL — ABNORMAL HIGH (ref 0.57–1.00)
Globulin, Total: 2.8 g/dL (ref 1.5–4.5)
Glucose: 101 mg/dL — ABNORMAL HIGH (ref 70–99)
Potassium: 4.5 mmol/L (ref 3.5–5.2)
Sodium: 135 mmol/L (ref 134–144)
Total Protein: 6.8 g/dL (ref 6.0–8.5)
eGFR: 41 mL/min/1.73 — ABNORMAL LOW (ref >59)

## 2023-10-06 LAB — VITAMIN B12: Vitamin B-12: 1241 pg/mL (ref 232–1245)

## 2023-10-06 LAB — HEMOGLOBIN A1C
Est. average glucose Bld gHb Est-mCnc: 114 mg/dL
Hgb A1c MFr Bld: 5.6 % (ref 4.8–5.6)

## 2023-10-06 LAB — TSH+FREE T4
Free T4: 1.69 ng/dL (ref 0.82–1.77)
TSH: 1.22 u[IU]/mL (ref 0.450–4.500)

## 2023-10-06 LAB — VITAMIN D 25 HYDROXY (VIT D DEFICIENCY, FRACTURES): Vit D, 25-Hydroxy: 65.6 ng/mL (ref 30.0–100.0)

## 2023-10-07 ENCOUNTER — Ambulatory Visit: Admitting: Internal Medicine

## 2023-10-18 ENCOUNTER — Ambulatory Visit

## 2023-10-26 ENCOUNTER — Ambulatory Visit: Attending: Cardiology | Admitting: *Deleted

## 2023-10-26 DIAGNOSIS — Z5181 Encounter for therapeutic drug level monitoring: Secondary | ICD-10-CM | POA: Diagnosis not present

## 2023-10-26 DIAGNOSIS — I4891 Unspecified atrial fibrillation: Secondary | ICD-10-CM | POA: Diagnosis not present

## 2023-10-26 LAB — POCT INR: INR: 3.2 — AB (ref 2.0–3.0)

## 2023-10-26 NOTE — Progress Notes (Signed)
 INR 3.2 Please see anticoagulation encounter

## 2023-10-26 NOTE — Patient Instructions (Signed)
 Hold warfarin tonight then resume 1/2 tablet daily.  Increase greens/salads Recheck INR in 3 weeks.

## 2023-11-12 NOTE — Progress Notes (Signed)
 Assessment/Plan:     Danielle Murphy is a very pleasant 83 y.o. year old RH female with a history of CAD, HTN, HLD, hx of pericardial cyst, s/p excision in 2002, aortic valve stenosis, and past hx of DVT/PE   seen today for evaluation of memory loss. MoCA today is .  Etiology is unclear, workup is in progress.  Patient is able to participate on ADLs*** Discussed starting donepezil  5 mg daily with goal of 10 mg daily if tolerated, patient agrees to proceed.   Memory Impairment of unclear etiology, concern for ***  MRI brain without contrast to assess for underlying structural abnormality and assess vascular load  Neurocognitive testing to further evaluate cognitive concerns and determine other underlying cause of memory changes, including potential contribution from sleep, anxiety, attention, or depression among others  Recommend good control of cardiovascular risk factors.  Follow with Cards, he is on Coumadin  Continue to control mood as per PCP Folllow up in ***months   Subjective:    The patient is accompanied by ***  who supplements  the history.    How long did patient have memory difficulties?  For about.  Patient reports some difficulty remembering new information, recent conversations, names. repeats oneself?  Endorsed Disoriented when walking into a room? Denies ***  Leaving objects in unusual places?  Denies.   Wandering behavior? Denies.   Any personality changes, or depression, anxiety? Denies *** Hallucinations or paranoia? Denies.   Seizures? Denies.    Any sleep changes?  Sleeps well *** Does not sleep well. **  frequent nightmares or dream reenactment, other REM behavior or sleepwalking   Sleep apnea? Denies.   Any hygiene concerns?  Denies.   Independent of bathing and dressing? Endorsed  Does the patient need help with medications?  is in charge *** Who is in charge of the finances?  is in charge   *** Any changes in appetite?   Denies. ***   Patient have trouble  swallowing?  Denies.   Does the patient cook? No*** yes, denies forgetting common recipes or kitchen accidents *** Any headaches?  Denies.   Chronic pain? Denies.   Ambulates with difficulty? Denies. ***  Needs a cane*** Needs a walker *** to ambulate for stability.   Recent falls or head injuries? Denies.     Vision changes?  Denies any new issues.  Has a history of*** Any strokelike symptoms? Denies.   Any tremors? Denies. *** Any anosmia? Denies.   Any incontinence of urine? Denies.   Any bowel dysfunction? Denies.      Patient lives with ***  History of heavy alcohol intake? Denies.   History of heavy tobacco use? Denies.   Family history of dementia?   *** with dementia  Does patient drive? No longer drives  *** yes, denies getting lost.***   Pertinent labs Sept 2025 : B12 1241, A1C 5.6, nl lipid panel, TSH 1.22, ***  MRI brain personally reviewed: ***  Allergies  Allergen Reactions   Aspirin Other (See Comments)    Takes 81mg  but has a childhood history of oral swelling with higher doses. Currently taking Coumadin    Niacin Other (See Comments)    Muscle tightness   Ramipril Other (See Comments)    Unknown reaction   Simvastatin Other (See Comments)    Cramping and muscle tightness   Sulfa  Antibiotics     Interfered with coumadin  / INR do not use     Current Outpatient Medications  Medication Instructions  acetaminophen  (TYLENOL ) 500 mg, Every 6 hours PRN   aspirin 81 mg, Every morning   FLUZONE HIGH-DOSE 0.5 ML injection 0.5 mLs,  Once   levocetirizine (XYZAL ) 5 MG tablet Every evening   losartan  (COZAAR ) 50 MG tablet TAKE 1 AND A HALF TABLETS BY MOUTH EVERY DAY   Memantine  HCl-Donepezil  HCl 7-10 MG CP24 1 capsule, Oral, Daily   metoprolol  succinate (TOPROL -XL) 50 MG 24 hr tablet TAKE 1 & 1/2 TABLETS BY MOUTH (75 MG) DAILY.   Multiple Vitamin (MULTIVITAMIN) tablet 1 tablet, Daily   mupirocin  ointment (BACTROBAN ) 2 % 1 Application, Topical, 2 times daily    rosuvastatin  (CRESTOR ) 10 mg, Oral, Daily   UNABLE TO FIND Med Name: [facility]   VITAMIN D , CHOLECALCIFEROL, PO Take by mouth.   warfarin (COUMADIN ) 4 MG tablet TAKE 1/2 A TABLET TO 1 TABLET BY MOUTH DAILY AS DIRECTED BY THE COUMADIN  CLINIC.     VITALS:  There were no vitals filed for this visit.   Physical Exam  :     No data to display             09/09/2022    3:40 PM  MMSE - Mini Mental State Exam  Orientation to time 2  Orientation to Place 5  Registration 3  Attention/ Calculation 4  Recall 1  Language- name 2 objects 2  Language- repeat 1  Language- follow 3 step command 3  Language- read & follow direction 1  Write a sentence 1  Copy design 1  Total score 24       HEENT:  Normocephalic, atraumatic.  The superficial temporal arteries are without ropiness or tenderness. Cardiovascular: Regular rate and rhythm. Lungs: Clear to auscultation bilaterally. Neck: There are no carotid bruits noted bilaterally. Orientation:  Alert and oriented to person, place and not to time***. No aphasia or dysarthria. Fund of knowledge is appropriate. Recent and remote memory impaired.  Attention and concentration are reduced***.  Able to name objects and repeat phrases. *** Delayed recall  /5 .*** Cranial nerves: There is good facial symmetry. Extraocular muscles are intact and visual fields are full to confrontational testing. Speech is fluent and clear. No tongue deviation. Hearing is intact to conversational tone.*** Tone: Tone is good throughout. Sensation: Sensation is intact to light touch.  Vibration is intact at the bilateral big toe.  Coordination: The patient has no difficulty with RAM's or FNF bilaterally. Normal finger to nose  Motor: Strength is 5/5 in the bilateral upper and lower extremities. There is no pronator drift. There are no fasciculations noted. DTR's: Deep tendon reflexes are 2/4 bilaterally. Gait and Station: The patient is able to ambulate without  difficulty. Gait is cautious and narrow. Stride length is normal. ***      Thank you for allowing us  the opportunity to participate in the care of this nice patient. Please do not hesitate to contact us  for any questions or concerns.   Total time spent on today's visit was *** minutes dedicated to this patient today, preparing to see patient, examining the patient, ordering tests and/or medications and counseling the patient, documenting clinical information in the EHR or other health record, independently interpreting results and communicating results to the patient/family, discussing treatment and goals, answering patient's questions and coordinating care.  Cc:  Del Wilhelmena Lloyd Sola, FNP  Camie Sevin 11/12/2023 5:38 PM

## 2023-11-15 ENCOUNTER — Ambulatory Visit: Attending: Cardiology | Admitting: *Deleted

## 2023-11-15 DIAGNOSIS — Z5181 Encounter for therapeutic drug level monitoring: Secondary | ICD-10-CM

## 2023-11-15 DIAGNOSIS — I4891 Unspecified atrial fibrillation: Secondary | ICD-10-CM | POA: Diagnosis not present

## 2023-11-15 LAB — POCT INR: INR: 1.5 — AB (ref 2.0–3.0)

## 2023-11-15 NOTE — Progress Notes (Signed)
 INR-1.5; Please see anticoagulation encounter

## 2023-11-15 NOTE — Patient Instructions (Signed)
 Take warfarin 1 tablet tonight then resume 1/2 tablet daily.  Increase greens/salads Recheck INR in 3 weeks.

## 2023-11-16 ENCOUNTER — Ambulatory Visit: Admitting: Physician Assistant

## 2023-11-16 ENCOUNTER — Ambulatory Visit

## 2023-11-16 ENCOUNTER — Encounter: Payer: Self-pay | Admitting: Physician Assistant

## 2023-11-16 VITALS — BP 175/69 | HR 74 | Resp 20 | Ht 62.0 in | Wt 119.0 lb

## 2023-11-16 DIAGNOSIS — R413 Other amnesia: Secondary | ICD-10-CM

## 2023-11-16 NOTE — Patient Instructions (Addendum)
 It was a pleasure to see you today at our office.   Recommendations:  Neurocognitive evaluation at our office   MRI of the brain, the radiology office will call you to arrange you appointment  [phone] Follow up in -3 months Recommend visiting the website :  Dementia Success Path to better understand some behaviors related to memory loss.      [email or web address]/neuro-[email or web address]-apps-[email or web address]-[email or web address]   RECOMMENDATIONS FOR ALL PATIENTS WITH MEMORY PROBLEMS: 1. Continue to exercise (Recommend 30 minutes of walking everyday, or 3 hours every week) 2. Increase social interactions - continue going to [place] and enjoy social gatherings with friends and family 3. Eat healthy, avoid fried foods and eat more fruits and vegetables 4. Maintain adequate blood pressure, blood sugar, and blood cholesterol level. Reducing the risk of stroke and cardiovascular disease also helps promoting better memory. 5. Avoid stressful situations. Live a simple life and avoid aggravations. Organize your time and prepare for the next day in anticipation. 6. Sleep well, avoid any interruptions of sleep and avoid any distractions in the bedroom that may interfere with adequate sleep quality 7. Avoid sugar, avoid sweets as there is a strong link between excessive sugar intake, diabetes, and cognitive impairment We discussed the Mediterranean diet, which has been shown to help patients reduce the risk of progressive memory disorders and reduces cardiovascular risk. This includes eating fish, eat fruits and green leafy vegetables, nuts like almonds and hazelnuts, walnuts, and also use olive oil. Avoid fast foods and fried foods as much as possible. Avoid sweets and sugar as sugar use has been linked to worsening of memory function.  There is always a concern of gradual progression of memory problems. If this is the case, then we may need to adjust level of care according to patient needs. Support, both to  the patient and caregiver, should then be put into place.      You have been referred for a neuropsychological evaluation (i.e., evaluation of memory and thinking abilities). Please bring someone with you to this appointment if possible, as it is helpful for the doctor to hear from both you and another adult who knows you well. Please bring eyeglasses and hearing aids if you wear them.    The evaluation will take approximately 3 hours and has two parts:   The first part is a clinical interview with the neuropsychologist (Dr. Richie or Dr. Gayland). During the interview, the neuropsychologist will speak with you and the individual you brought to the appointment.    The second part of the evaluation is testing with the doctor's technician Neal or Luke). During the testing, the technician will ask you to remember different types of material, solve problems, and answer some questionnaires. Your family member will not be present for this portion of the evaluation.   Please note: We must reserve several hours of the neuropsychologist's time and the psychometrician's time for your evaluation appointment. As such, there is a No-Show fee of $100. If you are unable to attend any of your appointments, please contact our office as soon as possible to reschedule.       FALL PRECAUTIONS: Be cautious when walking. Scan the area for obstacles that may increase the risk of trips and falls. When getting up in the mornings, sit up at the edge of the bed for a few minutes before getting out of bed. Consider elevating the bed at the head end to avoid drop of blood pressure when getting up. Walk always in a well-lit room (  use night lights in the walls). Avoid area rugs or power cords from appliances in the middle of the walkways. Use a walker or a cane if necessary and consider physical therapy for balance exercise. Get your eyesight checked regularly.  FINANCIAL OVERSIGHT: Supervision, especially oversight when making  financial decisions or transactions is also recommended.  HOME SAFETY: Consider the safety of the kitchen when operating appliances like stoves, microwave oven, and blender. Consider having supervision and share cooking responsibilities until no longer able to participate in those. Accidents with firearms and other hazards in the house should be identified and addressed as well.   ABILITY TO BE LEFT ALONE: If patient is unable to contact 911 operator, consider using LifeLine, or when the need is there, arrange for someone to stay with patients. Smoking is a fire hazard, consider supervision or cessation. Risk of wandering should be assessed by caregiver and if detected at any point, supervision and safe proof recommendations should be instituted.  MEDICATION SUPERVISION: Inability to self-administer medication needs to be constantly addressed. Implement a mechanism to ensure safe administration of the medications.      Mediterranean Diet A Mediterranean diet refers to food and lifestyle choices that are based on the traditions of countries located on the [employer]. This way of eating has been shown to help prevent certain conditions and improve outcomes for people who have chronic diseases, like kidney disease and heart disease. What are tips for following this plan? Lifestyle  Cook and eat meals together with your family, when possible. Drink enough fluid to keep your urine clear or pale yellow. Be physically active every day. This includes: Aerobic exercise like running or swimming. Leisure activities like gardening, walking, or housework. Get 7-8 hours of sleep each night. If recommended by your health care provider, drink red wine in moderation. This means 1 glass a day for nonpregnant women and 2 glasses a day for men. A glass of wine equals 5 oz (150 mL). Reading food labels  Check the serving size of packaged foods. For foods such as rice and pasta, the serving size refers to  the amount of cooked product, not dry. Check the total fat in packaged foods. Avoid foods that have saturated fat or trans fats. Check the ingredients list for added sugars, such as corn syrup. Shopping  At the grocery store, buy most of your food from the areas near the walls of the store. This includes: Fresh fruits and vegetables (produce). Grains, beans, nuts, and seeds. Some of these may be available in unpackaged forms or large amounts (in bulk). Fresh seafood. Poultry and eggs. Low-fat dairy products. Buy whole ingredients instead of prepackaged foods. Buy fresh fruits and vegetables in-season from local farmers markets. Buy frozen fruits and vegetables in resealable bags. If you do not have access to quality fresh seafood, buy precooked frozen shrimp or canned fish, such as tuna, salmon, or sardines. Buy small amounts of raw or cooked vegetables, salads, or olives from the deli or salad bar at your store. Stock your pantry so you always have certain foods on hand, such as olive oil, canned tuna, canned tomatoes, rice, pasta, and beans. Cooking  Cook foods with extra-virgin olive oil instead of using butter or other vegetable oils. Have meat as a side dish, and have vegetables or grains as your main dish. This means having meat in small portions or adding small amounts of meat to foods like pasta or stew. Use beans or vegetables instead of meat in  common dishes like chili or lasagna. Experiment with different cooking methods. Try roasting or broiling vegetables instead of steaming or sauteing them. Add frozen vegetables to soups, stews, pasta, or rice. Add nuts or seeds for added healthy fat at each meal. You can add these to yogurt, salads, or vegetable dishes. Marinate fish or vegetables using olive oil, lemon juice, garlic, and fresh herbs. Meal planning  Plan to eat 1 vegetarian meal one day each week. Try to work up to 2 vegetarian meals, if possible. Eat seafood 2 or more  times a week. Have healthy snacks readily available, such as: Vegetable sticks with hummus. Greek yogurt. Fruit and nut trail mix. Eat balanced meals throughout the week. This includes: Fruit: 2-3 servings a day Vegetables: 4-5 servings a day Low-fat dairy: 2 servings a day Fish, poultry, or lean meat: 1 serving a day Beans and legumes: 2 or more servings a week Nuts and seeds: 1-2 servings a day Whole grains: 6-8 servings a day Extra-virgin olive oil: 3-4 servings a day Limit red meat and sweets to only a few servings a month What are my food choices? Mediterranean diet Recommended Grains: Whole-grain pasta. Brown rice. Bulgar wheat. Polenta. Couscous. Whole-wheat bread. [place] Made[place]. Vegetables: Artichokes. Beets. Broccoli. Cabbage. Carrots. Eggplant. Green beans. Chard. Kale. Spinach. Onions. Leeks. Peas. Squash. Tomatoes. Peppers. Radishes. Fruits: Apples. Apricots. Avocado. Berries. Bananas. Cherries. Dates. Figs. Grapes. Lemons. Melon. Oranges. Peaches. Plums. Pomegranate. Meats and other protein foods: Beans. Almonds. Sunflower seeds. Pine nuts. Peanuts. Cod. Salmon. Scallops. Shrimp. Tuna. Tilapia. Clams. Oysters. Eggs. Dairy: Low-fat milk. Cheese. Greek yogurt. Beverages: Water. Red wine. Herbal tea. Fats and oils: Extra virgin olive oil. Avocado oil. Grape seed oil. Sweets and desserts: [place] yogurt with honey. Baked apples. Poached pears. Trail mix. Seasoning and other foods: Basil. Cilantro. Coriander. Cumin. Mint. Parsley. Sage. Rosemary. Tarragon. Garlic. Oregano. Thyme. Pepper. Balsalmic vinegar. Tahini. Hummus. Tomato sauce. Olives. Mushrooms. Limit these Grains: Prepackaged pasta or rice dishes. Prepackaged cereal with added sugar. Vegetables: Deep fried potatoes (french fries). Fruits: Fruit canned in syrup. Meats and other protein foods: Beef. Pork. Lamb. Poultry with skin. Hot dogs. [place]. Dairy: Ice cream. Sour cream. Whole milk. Beverages: Juice.  Sugar-sweetened soft drinks. Beer. Liquor and spirits. Fats and oils: Butter. Canola oil. Vegetable oil. Beef fat (tallow). Lard. Sweets and desserts: Cookies. Cakes. Pies. Candy. Seasoning and other foods: Mayonnaise. Premade sauces and marinades. The items listed may not be a complete list. Talk with your dietitian about what dietary choices are right for you. Summary The Mediterranean diet includes both food and lifestyle choices. Eat a variety of fresh fruits and vegetables, beans, nuts, seeds, and whole grains. Limit the amount of red meat and sweets that you eat. Talk with your health care provider about whether it is safe for you to drink red wine in moderation. This means 1 glass a day for nonpregnant women and 2 glasses a day for men. A glass of wine equals 5 oz (150 mL). This information is not intended to replace advice given to you by your health care provider. Make sure you discuss any questions you have with your health care provider. Document Released: 09/04/2015 Document Revised: 10/07/2015 Document Reviewed: 09/04/2015 Elsevier Interactive Patient Education  2017 [employer].

## 2023-11-20 ENCOUNTER — Ambulatory Visit (HOSPITAL_COMMUNITY)
Admission: RE | Admit: 2023-11-20 | Discharge: 2023-11-20 | Disposition: A | Discharge status: Home / Self Care | Source: Ambulatory Visit | Attending: Physician Assistant | Admitting: Physician Assistant

## 2023-11-20 DIAGNOSIS — R413 Other amnesia: Secondary | ICD-10-CM | POA: Diagnosis not present

## 2023-11-25 ENCOUNTER — Ambulatory Visit: Payer: Self-pay | Admitting: Neurology

## 2023-11-25 NOTE — Progress Notes (Signed)
 I left message to call office at 3:55pm to discuss results, 11/25/2023

## 2023-11-25 NOTE — Progress Notes (Signed)
 No answer at 11/25/2023 at 1:53pm

## 2023-11-28 ENCOUNTER — Encounter: Payer: Self-pay | Admitting: Radiology

## 2023-11-29 NOTE — Progress Notes (Signed)
 Voicemail will call back at 3:33pm 11/29/2023

## 2023-11-30 ENCOUNTER — Ambulatory Visit

## 2023-11-30 NOTE — Progress Notes (Signed)
 I left message to call office 8:21am 11/30/2023

## 2023-12-06 ENCOUNTER — Ambulatory Visit: Attending: Cardiology | Admitting: *Deleted

## 2023-12-06 DIAGNOSIS — Z5181 Encounter for therapeutic drug level monitoring: Secondary | ICD-10-CM

## 2023-12-06 DIAGNOSIS — I4891 Unspecified atrial fibrillation: Secondary | ICD-10-CM

## 2023-12-06 LAB — POCT INR: INR: 3.9 — AB (ref 2.0–3.0)

## 2023-12-06 NOTE — Patient Instructions (Signed)
 Hold warfarin tonight then resume 1/2 tablet daily.  Continue greens/salads Recheck INR in 3 weeks.

## 2023-12-06 NOTE — Progress Notes (Signed)
 INR 3.9. Please see anticoagulation encounter

## 2023-12-07 ENCOUNTER — Ambulatory Visit

## 2023-12-07 ENCOUNTER — Ambulatory Visit (INDEPENDENT_AMBULATORY_CARE_PROVIDER_SITE_OTHER)

## 2023-12-07 VITALS — Ht 62.0 in | Wt 112.0 lb

## 2023-12-07 DIAGNOSIS — Z780 Asymptomatic menopausal state: Secondary | ICD-10-CM

## 2023-12-07 DIAGNOSIS — Z0000 Encounter for general adult medical examination without abnormal findings: Secondary | ICD-10-CM | POA: Diagnosis not present

## 2023-12-07 NOTE — Patient Instructions (Addendum)
 Ms. Plotner,  Thank you for taking the time for your Medicare Wellness Visit. I appreciate your continued commitment to your health goals. Please review the care plan we discussed, and feel free to reach out if I can assist you further.  Please note that Annual Wellness Visits do not include a physical exam. Some assessments may be limited, especially if the visit was conducted virtually. If needed, we may recommend an in-person follow-up with your provider.  Ongoing Care Seeing your primary care provider every 3 to 6 months helps us  monitor your health and provide consistent, personalized care.   Referrals If a referral was made during today's visit and you haven't received any updates within two weeks, please contact the referred provider directly to check on the status.  Call to schedule your bone density appointment [phone] Orders Placed This Encounter  Procedures   DG Bone Density    Standing Status:   Future    Expiration Date:   12/06/2024    Reason for Exam (SYMPTOM  OR DIAGNOSIS REQUIRED):   post menopausal estrogen deficient    Preferred imaging location?:   [facility] [place]    No orders of the defined types were placed in this encounter.  Orders Placed This Encounter  Procedures   DG Bone Density    Standing Status:   Future    Expiration Date:   12/06/2024    Reason for Exam (SYMPTOM  OR DIAGNOSIS REQUIRED):   post menopausal estrogen deficient    Preferred imaging location?:   [facility]    No orders of the defined types were placed in this encounter.  Osteoporosis Screening An order was placed for you to have your Osteoporosis Screening. Call the number below to schedule that [facility]  [phone]  Recommended Screenings:  Health Maintenance  Topic Date Due   COVID-19 Vaccine (1) Never done   Pneumococcal Vaccine for age over 19 (1 of 2 - PCV) Never done   Zoster (Shingles) Vaccine (1 of 2) Never done   DEXA scan (bone density  measurement)  09/21/2017   DTaP/Tdap/Td vaccine (2 - Td or Tdap) 07/16/2024   Medicare Annual Wellness Visit  12/06/2024   Flu Shot  Completed   Meningitis B Vaccine  Aged Out       12/07/2023    8:47 AM  Advanced Directives  Does Patient Have a Medical Advance Directive? No  Would patient like information on creating a medical advance directive? No - Patient declined    Vision: Annual vision screenings are recommended for early detection of glaucoma, cataracts, and diabetic retinopathy. These exams can also reveal signs of chronic conditions such as diabetes and high blood pressure.  Dental: Annual dental screenings help detect early signs of oral cancer, gum disease, and other conditions linked to overall health, including heart disease and diabetes.  Please see the attached documents for additional preventive care recommendations.

## 2023-12-07 NOTE — Progress Notes (Signed)
 Please attest and cosign this visit due to patients primary care provider not being in the office at the time the visit was completed.   Chief Complaint  Patient presents with   Medicare Wellness     Subjective:   Danielle Murphy is a 83 y.o. female who presents for a Medicare Annual Wellness Visit.  Allergies (verified) Aspirin, Niacin, Ramipril, Simvastatin, and Sulfa  antibiotics   History: Past Medical History:  Diagnosis Date   Aortic stenosis    Coronary atherosclerosis of native coronary artery    BMS LAD 2002   DVT (deep venous thrombosis) (HCC)    Essential hypertension    Hyperlipidemia    Pericardial cyst    Status post excision 2002   Pulmonary embolism (HCC)    Past Surgical History:  Procedure Laterality Date   Pericardial cyst excision  2002   Family History  Problem Relation Age of Onset   Stroke Mother    Cancer Father    Social History   Occupational History   Occupation: Retired  Tobacco Use   Smoking status: Never    Passive exposure: Never   Smokeless tobacco: Never  Vaping Use   Vaping status: Never Used  Substance and Sexual Activity   Alcohol use: No    Alcohol/week: 0.0 standard drinks of alcohol   Drug use: No   Sexual activity: Not Currently   Tobacco Counseling Counseling given: Yes  SDOH Screenings   Food Insecurity: No Food Insecurity (12/07/2023)  Housing: Low Risk  (12/07/2023)  Transportation Needs: No Transportation Needs (12/07/2023)  Utilities: Not At Risk (12/07/2023)  Alcohol Screen: Low Risk  (07/13/2022)  Depression (PHQ2-9): Low Risk  (12/07/2023)  Financial Resource Strain: Low Risk  (07/13/2022)  Physical Activity: Inactive (12/07/2023)  Social Connections: Unknown (12/07/2023)  Stress: No Stress Concern Present (12/07/2023)  Tobacco Use: Low Risk  (12/07/2023)  Health Literacy: Patient Declined (12/07/2023)   Depression Screen    12/07/2023    8:57 AM 09/09/2022    2:56 PM 07/13/2022   10:23 AM 04/26/2022     9:35 AM  PHQ 2/9 Scores  PHQ - 2 Score 0 0 0 0  PHQ- 9 Score 0 0   0      Data saved with a previous flowsheet row definition     Goals Addressed               This Visit's Progress     Remain active and healthy (pt-stated)         Visit info / Clinical Intake: Medicare Wellness Visit Type:: Subsequent Annual Wellness Visit Persons participating in visit:: patient & caregiver Medicare Wellness Visit Mode:: Video Because this visit was a virtual/telehealth visit:: pt reported vitals If Telephone or Video please confirm:: I connected with the patient using audio enabled telemedicine application and verified that I am speaking with the correct person using two identifiers; I discussed the limitations of evaluation and management by telemedicine; The patient expressed understanding and agreed to proceed Patient Location:: home Provider Location:: office Information given by:: patient; family (patient assisted by daughter, Verneita) Interpreter Needed?: No Pre-visit prep was completed: yes AWV questionnaire completed by patient prior to visit?: no Living arrangements:: with family/others Patient's Overall Health Status Rating: (!) fair Typical amount of pain: none Does pain affect daily life?: no Are you currently prescribed opioids?: no  Dietary Habits and Nutritional Risks How many meals a day?: 3 Most meals are obtained by: having others provide food In the last  2 weeks, have you had any of the following?: none Diabetic:: no  Functional Status Activities of Daily Living (to include ambulation/medication): (!) Needs Assist Feeding: Independent Dressing/Grooming: Independent Bathing: Independent Toileting: Independent Transfer: Independent Ambulation: Independent Medication Administration: Dependent Is this a change from baseline?: Pre-admission baseline Home Management: Dependent Manage your own finances?: (!) no Primary transportation is:  family/friends Concerns about vision?: no *vision screening is required for WTM* Concerns about hearing?: no  Fall Screening Falls in the past year?: 0 Number of falls in past year: 0 Was there an injury with Fall?: 0 Fall Risk Category Calculator: 0 Patient Fall Risk Level: Low Fall Risk  Fall Risk Patient at Risk for Falls Due to: No Fall Risks Fall risk Follow up: Falls evaluation completed; Education provided; Falls prevention discussed  Home and Transportation Safety: All rugs have non-skid backing?: yes All stairs or steps have railings?: yes Grab bars in the bathtub or shower?: yes Have non-skid surface in bathtub or shower?: yes Good home lighting?: yes Regular seat belt use?: yes Hospital stays in the last year:: no  Cognitive Assessment Difficulty concentrating, remembering, or making decisions? : yes Will 6CIT or Mini Cog be Completed: no 6CIT or Mini Cog Declined: patient has a diagnosis of dementia or cognitive impairment  Advance Directives (For Healthcare) Does Patient Have a Medical Advance Directive?: No Would patient like information on creating a medical advance directive?: No - Patient declined  Reviewed/Updated  Reviewed/Updated: Reviewed All (Medical, Surgical, Family, Medications, Allergies, Care Teams, Patient Goals)        Objective:    Today's Vitals   12/07/23 0842  Weight: 112 lb (50.8 kg)  Height: 5' 2 (1.575 m)   Body mass index is 20.49 kg/m.  Current Medications (verified) Outpatient Encounter Medications as of 12/07/2023  Medication Sig   acetaminophen  (TYLENOL ) 500 MG tablet Take 500 mg by mouth every 6 (six) hours as needed.   Apoaequorin (PREVAGEN PO) Take by mouth.   aspirin 81 MG tablet Take 81 mg by mouth every morning.   FLUZONE HIGH-DOSE 0.5 ML injection Inject 0.5 mLs into the muscle once.   losartan  (COZAAR ) 50 MG tablet TAKE 1 AND A HALF TABLETS BY MOUTH EVERY DAY   metoprolol  succinate (TOPROL -XL) 50 MG 24 hr  tablet TAKE 1 & 1/2 TABLETS BY MOUTH (75 MG) DAILY.   Multiple Vitamin (MULTIVITAMIN) tablet Take 1 tablet by mouth daily.   rosuvastatin  (CRESTOR ) 10 MG tablet TAKE 1 TABLET BY MOUTH EVERY DAY   UNABLE TO FIND Med Name: [facility]   VITAMIN D , CHOLECALCIFEROL, PO Take by mouth.   warfarin (COUMADIN ) 4 MG tablet TAKE 1/2 A TABLET TO 1 TABLET BY MOUTH DAILY AS DIRECTED BY THE COUMADIN  CLINIC.   [DISCONTINUED] levocetirizine (XYZAL ) 5 MG tablet TAKE 1 TABLET BY MOUTH EVERY DAY IN THE EVENING   [DISCONTINUED] Memantine  HCl-Donepezil  HCl 7-10 MG CP24 Take 1 capsule by mouth daily. (Patient not taking: Reported on 05/12/2023)   [DISCONTINUED] mupirocin  ointment (BACTROBAN ) 2 % Apply 1 Application topically 2 (two) times daily. (Patient not taking: Reported on 11/16/2023)   No facility-administered encounter medications on file as of 12/07/2023.   Hearing/Vision screen Hearing Screening - Comments:: Is supposed to wear hearing aids, but she doesn't Vision Screening - Comments:: Patient is up to date with yearly eye exams. Can't recall who the provider is.  Immunizations and Health Maintenance Health Maintenance  Topic Date Due   COVID-19 Vaccine (1) Never done   Pneumococcal Vaccine: 50+  Years (1 of 2 - PCV) Never done   Zoster Vaccines- Shingrix (1 of 2) Never done   DEXA SCAN  09/21/2017   DTaP/Tdap/Td (2 - Td or Tdap) 07/16/2024   Medicare Annual Wellness (AWV)  12/06/2024   Influenza Vaccine  Completed   Meningococcal B Vaccine  Aged Out        Assessment/Plan:  This is a routine wellness examination for Danielle Murphy.  Patient Care Team: Del Wilhelmena Falter, Hilario, FNP as PCP - General (Family Medicine) Debera Jayson MATSU, MD as PCP - Cardiology (Cardiology) Shaaron Lamar HERO, MD as Consulting Physician (Gastroenterology) Georjean Darice HERO, MD as Consulting Physician (Neurology) Alvan Dorn FALCON, MD as Consulting Physician (Cardiology) Glendia Bare, OD (Optometry)  I have personally  reviewed and noted the following in the patient's chart:   Medical and social history Use of alcohol, tobacco or illicit drugs  Current medications and supplements including opioid prescriptions. Functional ability and status Nutritional status Physical activity Advanced directives List of other physicians Hospitalizations, surgeries, and ER visits in previous 12 months Vitals Screenings to include cognitive, depression, and falls Referrals and appointments  Orders Placed This Encounter  Procedures   DG Bone Density    Standing Status:   Future    Expiration Date:   12/06/2024    Reason for Exam (SYMPTOM  OR DIAGNOSIS REQUIRED):   post menopausal estrogen deficient    Preferred imaging location?:   [facility]   In addition, I have reviewed and discussed with patient certain preventive protocols, quality metrics, and best practice recommendations. A written personalized care plan for preventive services as well as general preventive health recommendations were provided to patient.   Jasean Ambrosia, CMA   12/07/2023   Return on December 10, 2024 at 8:40 am in person, for your yearly Medicare Wellness Visit.  After Visit Summary: (MyChart) Due to this being a telephonic visit, the after visit summary with patients personalized plan was offered to patient via MyChart   Nurse Notes:

## 2023-12-10 ENCOUNTER — Other Ambulatory Visit: Payer: Self-pay | Admitting: Nurse Practitioner

## 2023-12-13 ENCOUNTER — Telehealth: Payer: Self-pay | Admitting: Physician Assistant

## 2023-12-13 NOTE — Telephone Encounter (Signed)
 Pt's son called in this afternoon and he would like a return call back to discuss Pt's MRI results. Thanks

## 2023-12-13 NOTE — Telephone Encounter (Signed)
MRI is resulted.

## 2023-12-14 NOTE — Telephone Encounter (Signed)
 Deward Levander Ford Son Emergency Contact [phone] [address] [place] [address], I left message to call office back to discuss MRI.

## 2023-12-15 NOTE — Telephone Encounter (Signed)
 Dpr Deward son, thanked me for calling and understood to follow up as scheduled.

## 2023-12-27 ENCOUNTER — Ambulatory Visit

## 2023-12-28 ENCOUNTER — Ambulatory Visit: Attending: Cardiology | Admitting: *Deleted

## 2023-12-28 DIAGNOSIS — I4891 Unspecified atrial fibrillation: Secondary | ICD-10-CM | POA: Diagnosis not present

## 2023-12-28 DIAGNOSIS — Z5181 Encounter for therapeutic drug level monitoring: Secondary | ICD-10-CM | POA: Diagnosis not present

## 2023-12-28 LAB — POCT INR: INR: 2.1 (ref 2.0–3.0)

## 2023-12-28 NOTE — Progress Notes (Signed)
 INR 2.1; Please see anticoagulation encounter

## 2023-12-28 NOTE — Patient Instructions (Signed)
 Continue warfarin 1/2 tablet daily.  Continue greens/salads Recheck INR in 4 weeks.

## 2024-01-05 ENCOUNTER — Ambulatory Visit: Payer: Self-pay

## 2024-01-05 ENCOUNTER — Institutional Professional Consult (permissible substitution): Admitting: Psychology

## 2024-01-13 ENCOUNTER — Encounter: Admitting: Psychology

## 2024-01-25 ENCOUNTER — Ambulatory Visit: Attending: Cardiology | Admitting: *Deleted

## 2024-01-25 DIAGNOSIS — I4891 Unspecified atrial fibrillation: Secondary | ICD-10-CM | POA: Diagnosis not present

## 2024-01-25 DIAGNOSIS — Z5181 Encounter for therapeutic drug level monitoring: Secondary | ICD-10-CM | POA: Diagnosis not present

## 2024-01-25 LAB — POCT INR: INR: 1.5 — AB (ref 2.0–3.0)

## 2024-01-25 NOTE — Patient Instructions (Signed)
 Take warfarin 1 tablet tonight and tomorrow night then resume 1/2 tablet daily.  Continue greens/salads Recheck INR in 3 weeks.

## 2024-01-25 NOTE — Progress Notes (Signed)
 INR-1.5; Please see anticoagulation encounter

## 2024-02-06 ENCOUNTER — Ambulatory Visit: Admitting: Psychology

## 2024-02-06 ENCOUNTER — Ambulatory Visit: Payer: Self-pay

## 2024-02-06 DIAGNOSIS — F03A0 Unspecified dementia, mild, without behavioral disturbance, psychotic disturbance, mood disturbance, and anxiety: Secondary | ICD-10-CM

## 2024-02-06 DIAGNOSIS — F04 Amnestic disorder due to known physiological condition: Secondary | ICD-10-CM | POA: Diagnosis not present

## 2024-02-06 DIAGNOSIS — R4189 Other symptoms and signs involving cognitive functions and awareness: Secondary | ICD-10-CM

## 2024-02-06 NOTE — Progress Notes (Addendum)
" ° °  Psychometrician Note   Cognitive testing was administered to Danielle Murphy by Evalene Pizza, B.S. (psychometrist) under the supervision of Dr. Renda Beckwith, Psy.D., licensed psychologist on 02/06/2024. Danielle Murphy did not appear overtly distressed by the testing session per behavioral observation or responses across self-report questionnaires. Rest breaks were offered.   The battery of tests administered was selected by Dr. Renda Beckwith, Psy.D. with consideration to Danielle Murphy's current level of functioning, the nature of her symptoms, emotional and behavioral responses during interview, level of literacy, observed level of motivation/effort, and the nature of the referral question. This battery was communicated to the psychometrist. Communication between Dr. Renda Beckwith, Psy.D. and the psychometrist was ongoing throughout the evaluation and Dr. Renda Beckwith, Psy.D. was immediately accessible at all times. Dr. Renda Beckwith, Psy.D. provided supervision to the psychometrist on the date of this service to the extent necessary to assure the quality of all services provided.    Danielle Murphy will return within approximately 1-2 weeks for an interactive feedback session with Dr. Beckwith at which time her test performances, clinical impressions, and treatment recommendations will be reviewed in detail. Danielle Murphy understands she can contact our office should she require our assistance before this time.  A total of 128 minutes of billable time were spent face-to-face with Danielle Murphy by the psychometrist. This includes both test administration and scoring time. Billing for these services is reflected in the clinical report generated by Dr. Renda Beckwith, Psy.D.  This note reflects time spent with the psychometrician and does not include test scores or any clinical interpretations made by Dr. Beckwith. The full report will follow in a separate note. "
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
## 2024-02-13 ENCOUNTER — Ambulatory Visit: Admitting: Psychology

## 2024-02-13 DIAGNOSIS — F04 Amnestic disorder due to known physiological condition: Secondary | ICD-10-CM

## 2024-02-13 DIAGNOSIS — F03A0 Unspecified dementia, mild, without behavioral disturbance, psychotic disturbance, mood disturbance, and anxiety: Secondary | ICD-10-CM | POA: Diagnosis not present

## 2024-02-13 NOTE — Progress Notes (Signed)
" ° °  NEUROPSYCHOLOGY FEEDBACK SESSION Danielle [place]. [facility]  [place] Department of Neurology  Date of Feedback Session: 02/13/2024  REASON FOR REFERRAL   Danielle Murphy is an 84 year old, right-handed, White female with 12 years of formal education. She was referred for neuropsychological evaluation by Camie Sevin, PA-C, to assess current neurocognitive functioning, document potential cognitive deficits, and assist with treatment planning. This is her first neuropsychological evaluation.  FEEDBACK   Patient completed a comprehensive neuropsychological evaluation on 02/06/2024. Please refer to that encounter for the full report and recommendations. Briefly, results indicated widespread cognitive deficits, with learning/memory being the most consistently and markedly impaired domain. 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 The clinical picture is concerning for a mixed etiology, including both neurodegenerative and vascular contributions. While not likely to be primary drivers of her cognitive deficits, she also has significant untreated hearing loss and low levels of cognitive and social stimulation, both of which may exacerbate cognitive difficulties.  Today, the patient was accompanied by her son. They were provided verbal feedback regarding the findings and impression during this visit, and their questions were answered. A copy of the report was provided at the conclusion of the visit.  DISPOSITION   Patient will follow up with the referring provider, Ms. Wertman. No follow-up neuropsychological testing was scheduled at this time. Please feel free to refer the patient for repeated evaluation if she shows a significant change in neurocognitive status.  SERVICE   This feedback session was conducted by Renda Beckwith, Psy.D. One unit of [id number] (35 minutes) was billed for Dr. Beckwith' time spent in preparing, conducting, and documenting  the current feedback session.  This report was generated using voice recognition software. While this document has been carefully reviewed, transcription errors may be present. I apologize in advance for any inconvenience. Please contact me if further clarification is needed.  "

## 2024-02-15 ENCOUNTER — Ambulatory Visit

## 2024-02-23 ENCOUNTER — Ambulatory Visit

## 2024-02-28 ENCOUNTER — Ambulatory Visit
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
## 2024-03-02 ENCOUNTER — Ambulatory Visit: Admitting: Physician Assistant

## 2024-03-05 ENCOUNTER — Ambulatory Visit

## 2024-04-06 ENCOUNTER — Ambulatory Visit: Admitting: Physician Assistant

## 2024-12-10 ENCOUNTER — Ambulatory Visit
